# Patient Record
Sex: Female | Born: 1935 | Race: White | Hispanic: No | Smoking: Never smoker
Health system: Southern US, Community
[De-identification: ages and names within clinical notes are randomized; demographics above are authoritative.]

## PROBLEM LIST (undated history)

## (undated) DIAGNOSIS — I8290 Acute embolism and thrombosis of unspecified vein: Secondary | ICD-10-CM

## (undated) DIAGNOSIS — M1990 Unspecified osteoarthritis, unspecified site: Secondary | ICD-10-CM

## (undated) DIAGNOSIS — I2510 Atherosclerotic heart disease of native coronary artery without angina pectoris: Secondary | ICD-10-CM

## (undated) DIAGNOSIS — K219 Gastro-esophageal reflux disease without esophagitis: Secondary | ICD-10-CM

## (undated) DIAGNOSIS — E079 Disorder of thyroid, unspecified: Secondary | ICD-10-CM

## (undated) DIAGNOSIS — O2230 Deep phlebothrombosis in pregnancy, unspecified trimester: Secondary | ICD-10-CM

## (undated) DIAGNOSIS — G459 Transient cerebral ischemic attack, unspecified: Secondary | ICD-10-CM

## (undated) DIAGNOSIS — I219 Acute myocardial infarction, unspecified: Secondary | ICD-10-CM

## (undated) DIAGNOSIS — J449 Chronic obstructive pulmonary disease, unspecified: Secondary | ICD-10-CM

## (undated) DIAGNOSIS — I639 Cerebral infarction, unspecified: Secondary | ICD-10-CM

## (undated) DIAGNOSIS — Z950 Presence of cardiac pacemaker: Secondary | ICD-10-CM

## (undated) DIAGNOSIS — I2699 Other pulmonary embolism without acute cor pulmonale: Secondary | ICD-10-CM

## (undated) DIAGNOSIS — K5792 Diverticulitis of intestine, part unspecified, without perforation or abscess without bleeding: Secondary | ICD-10-CM

## (undated) DIAGNOSIS — M419 Scoliosis, unspecified: Secondary | ICD-10-CM

## (undated) DIAGNOSIS — C801 Malignant (primary) neoplasm, unspecified: Secondary | ICD-10-CM

## (undated) HISTORY — PX: EYE SURGERY: SHX253

## (undated) HISTORY — PX: BREAST SURGERY: SHX581

## (undated) HISTORY — PX: TONSILLECTOMY: SUR1361

## (undated) HISTORY — PX: PACEMAKER IMPLANT: EP1218

## (undated) HISTORY — PX: APPENDECTOMY: SHX54

---

## 2004-12-31 ENCOUNTER — Ambulatory Visit: Payer: Self-pay | Admitting: Internal Medicine

## 2005-04-13 ENCOUNTER — Ambulatory Visit: Payer: Self-pay | Admitting: Pain Medicine

## 2005-10-01 ENCOUNTER — Other Ambulatory Visit: Payer: Self-pay

## 2005-10-01 ENCOUNTER — Inpatient Hospital Stay: Payer: Self-pay | Admitting: Internal Medicine

## 2005-10-02 ENCOUNTER — Other Ambulatory Visit: Payer: Self-pay

## 2005-10-03 ENCOUNTER — Other Ambulatory Visit: Payer: Self-pay

## 2005-10-10 ENCOUNTER — Ambulatory Visit: Payer: Self-pay | Admitting: Internal Medicine

## 2005-10-13 ENCOUNTER — Ambulatory Visit: Payer: Self-pay | Admitting: Internal Medicine

## 2005-10-25 ENCOUNTER — Ambulatory Visit: Payer: Self-pay | Admitting: Internal Medicine

## 2005-11-02 ENCOUNTER — Ambulatory Visit: Payer: Self-pay | Admitting: Internal Medicine

## 2006-03-07 ENCOUNTER — Ambulatory Visit: Payer: Self-pay | Admitting: Internal Medicine

## 2006-03-23 ENCOUNTER — Ambulatory Visit: Payer: Self-pay

## 2006-03-30 ENCOUNTER — Other Ambulatory Visit: Payer: Self-pay

## 2006-03-30 ENCOUNTER — Inpatient Hospital Stay: Payer: Self-pay | Admitting: Internal Medicine

## 2006-04-21 ENCOUNTER — Ambulatory Visit: Payer: Self-pay | Admitting: Internal Medicine

## 2006-06-29 ENCOUNTER — Other Ambulatory Visit: Payer: Self-pay

## 2006-06-29 ENCOUNTER — Inpatient Hospital Stay: Payer: Self-pay | Admitting: Internal Medicine

## 2006-07-14 ENCOUNTER — Ambulatory Visit: Payer: Self-pay | Admitting: Internal Medicine

## 2006-11-15 ENCOUNTER — Other Ambulatory Visit: Payer: Self-pay

## 2006-11-15 ENCOUNTER — Inpatient Hospital Stay: Payer: Self-pay | Admitting: Internal Medicine

## 2006-12-19 ENCOUNTER — Ambulatory Visit: Payer: Self-pay | Admitting: Internal Medicine

## 2007-03-08 ENCOUNTER — Ambulatory Visit: Payer: Self-pay | Admitting: Internal Medicine

## 2007-04-06 ENCOUNTER — Ambulatory Visit: Payer: Self-pay | Admitting: Internal Medicine

## 2007-09-05 ENCOUNTER — Ambulatory Visit: Payer: Self-pay | Admitting: Rheumatology

## 2008-04-03 ENCOUNTER — Ambulatory Visit: Payer: Self-pay | Admitting: Ophthalmology

## 2008-04-16 ENCOUNTER — Ambulatory Visit: Payer: Self-pay | Admitting: Ophthalmology

## 2008-04-18 ENCOUNTER — Ambulatory Visit: Payer: Self-pay | Admitting: Internal Medicine

## 2008-04-25 ENCOUNTER — Ambulatory Visit: Payer: Self-pay | Admitting: Unknown Physician Specialty

## 2009-04-22 ENCOUNTER — Ambulatory Visit: Payer: Self-pay | Admitting: Unknown Physician Specialty

## 2009-04-28 ENCOUNTER — Ambulatory Visit: Payer: Self-pay | Admitting: Unknown Physician Specialty

## 2009-05-07 ENCOUNTER — Ambulatory Visit: Payer: Self-pay | Admitting: Unknown Physician Specialty

## 2009-08-18 ENCOUNTER — Ambulatory Visit: Payer: Self-pay | Admitting: Internal Medicine

## 2010-09-14 ENCOUNTER — Ambulatory Visit: Payer: Self-pay | Admitting: Internal Medicine

## 2010-11-24 ENCOUNTER — Ambulatory Visit: Payer: Self-pay | Admitting: Internal Medicine

## 2011-01-19 ENCOUNTER — Ambulatory Visit: Payer: Self-pay | Admitting: Cardiology

## 2011-01-27 ENCOUNTER — Ambulatory Visit: Payer: Self-pay | Admitting: Internal Medicine

## 2011-02-27 ENCOUNTER — Emergency Department: Payer: Self-pay | Admitting: Emergency Medicine

## 2011-06-17 ENCOUNTER — Ambulatory Visit: Payer: Self-pay | Admitting: Internal Medicine

## 2011-09-23 ENCOUNTER — Encounter: Payer: Self-pay | Admitting: Internal Medicine

## 2011-10-04 ENCOUNTER — Ambulatory Visit: Payer: Self-pay | Admitting: Internal Medicine

## 2011-10-08 ENCOUNTER — Encounter: Payer: Self-pay | Admitting: Internal Medicine

## 2011-11-05 ENCOUNTER — Encounter: Payer: Self-pay | Admitting: Internal Medicine

## 2011-12-01 ENCOUNTER — Ambulatory Visit: Payer: Self-pay | Admitting: Internal Medicine

## 2012-02-18 ENCOUNTER — Inpatient Hospital Stay: Payer: Self-pay | Admitting: Internal Medicine

## 2012-02-18 LAB — URINALYSIS, COMPLETE
Leukocyte Esterase: NEGATIVE
Protein: NEGATIVE
Squamous Epithelial: 1
WBC UR: 2 /HPF (ref 0–5)

## 2012-02-18 LAB — COMPREHENSIVE METABOLIC PANEL
Alkaline Phosphatase: 107 U/L (ref 50–136)
BUN: 18 mg/dL (ref 7–18)
Bilirubin,Total: 1 mg/dL (ref 0.2–1.0)
Chloride: 104 mmol/L (ref 98–107)
Co2: 26 mmol/L (ref 21–32)
Creatinine: 0.69 mg/dL (ref 0.60–1.30)
EGFR (African American): >60
EGFR (Non-African Amer.): >60
Osmolality: 281 (ref 275–301)
Total Protein: 6.8 g/dL (ref 6.4–8.2)

## 2012-02-18 LAB — CBC
MCH: 28.1 pg (ref 26.0–34.0)
MCV: 88 fL (ref 80–100)
Platelet: 213 10*3/uL (ref 150–440)
RBC: 4.07 10*6/uL (ref 3.80–5.20)
RDW: 13.6 % (ref 11.5–14.5)

## 2012-02-18 LAB — APTT: Activated PTT: 27.5 secs (ref 23.6–35.9)

## 2012-02-18 LAB — LIPASE, BLOOD: Lipase: 32 U/L — ABNORMAL LOW (ref 73–393)

## 2012-02-18 LAB — CK TOTAL AND CKMB (NOT AT ARMC): CK-MB: 0.6 ng/mL (ref 0.5–3.6)

## 2012-02-18 LAB — PROTIME-INR: Prothrombin Time: 12.6 secs (ref 11.5–14.7)

## 2012-02-18 LAB — TROPONIN I: Troponin-I: 0.03 ng/mL

## 2012-02-19 LAB — CBC WITH DIFFERENTIAL/PLATELET
Basophil #: 0 10*3/uL (ref 0.0–0.1)
Basophil %: 0.1 %
Eosinophil #: 0 10*3/uL (ref 0.0–0.7)
Eosinophil %: 0 %
MCH: 27.7 pg (ref 26.0–34.0)
MCHC: 31.7 g/dL — ABNORMAL LOW (ref 32.0–36.0)
MCV: 87 fL (ref 80–100)
Monocyte #: 1.5 x10 3/mm — ABNORMAL HIGH (ref 0.2–0.9)
Monocyte %: 5.8 %
Neutrophil %: 90.6 %
RBC: 3.78 10*6/uL — ABNORMAL LOW (ref 3.80–5.20)
RDW: 13.6 % (ref 11.5–14.5)
WBC: 26.3 10*3/uL — ABNORMAL HIGH (ref 3.6–11.0)

## 2012-02-20 LAB — CBC WITH DIFFERENTIAL/PLATELET
Basophil #: 0.1 10*3/uL (ref 0.0–0.1)
Basophil %: 0.6 %
Eosinophil %: 0 %
HCT: 32 % — ABNORMAL LOW (ref 35.0–47.0)
Lymphocyte #: 0.6 10*3/uL — ABNORMAL LOW (ref 1.0–3.6)
Lymphocyte %: 2.8 %
MCH: 27.7 pg (ref 26.0–34.0)
MCHC: 31.8 g/dL — ABNORMAL LOW (ref 32.0–36.0)
MCV: 87 fL (ref 80–100)
Neutrophil #: 21.9 10*3/uL — ABNORMAL HIGH (ref 1.4–6.5)
Neutrophil %: 94 %
Platelet: 207 10*3/uL (ref 150–440)
RBC: 3.68 10*6/uL — ABNORMAL LOW (ref 3.80–5.20)
WBC: 23.2 10*3/uL — ABNORMAL HIGH (ref 3.6–11.0)

## 2012-02-20 LAB — URINE CULTURE

## 2012-02-20 LAB — BASIC METABOLIC PANEL
Anion Gap: 8 (ref 7–16)
BUN: 14 mg/dL (ref 7–18)
Calcium, Total: 9.1 mg/dL (ref 8.5–10.1)
Chloride: 107 mmol/L (ref 98–107)
Co2: 23 mmol/L (ref 21–32)
Creatinine: 0.56 mg/dL — ABNORMAL LOW (ref 0.60–1.30)
EGFR (African American): >60

## 2012-02-21 LAB — BASIC METABOLIC PANEL
Calcium, Total: 9.3 mg/dL (ref 8.5–10.1)
Co2: 22 mmol/L (ref 21–32)
Creatinine: 0.64 mg/dL (ref 0.60–1.30)
EGFR (African American): >60
Osmolality: 278 (ref 275–301)
Sodium: 137 mmol/L (ref 136–145)

## 2012-02-21 LAB — CBC WITH DIFFERENTIAL/PLATELET
Eosinophil %: 0 %
HCT: 31.6 % — ABNORMAL LOW (ref 35.0–47.0)
HGB: 10.3 g/dL — ABNORMAL LOW (ref 12.0–16.0)
Lymphocyte #: 0.7 10*3/uL — ABNORMAL LOW (ref 1.0–3.6)
Lymphocyte %: 3.4 %
MCHC: 32.4 g/dL (ref 32.0–36.0)
MCV: 86 fL (ref 80–100)
Monocyte #: 0.4 x10 3/mm (ref 0.2–0.9)
Neutrophil #: 19.3 10*3/uL — ABNORMAL HIGH (ref 1.4–6.5)
Platelet: 227 10*3/uL (ref 150–440)
WBC: 20.4 10*3/uL — ABNORMAL HIGH (ref 3.6–11.0)

## 2012-02-22 LAB — CBC WITH DIFFERENTIAL/PLATELET
Basophil #: 0 10*3/uL (ref 0.0–0.1)
Basophil %: 0.2 %
Eosinophil #: 0 10*3/uL (ref 0.0–0.7)
Eosinophil %: 0.1 %
HCT: 31.2 % — ABNORMAL LOW (ref 35.0–47.0)
HGB: 9.8 g/dL — ABNORMAL LOW (ref 12.0–16.0)
Lymphocyte #: 0.5 10*3/uL — ABNORMAL LOW (ref 1.0–3.6)
MCH: 27 pg (ref 26.0–34.0)
MCHC: 31.3 g/dL — ABNORMAL LOW (ref 32.0–36.0)
Monocyte #: 0.4 x10 3/mm (ref 0.2–0.9)
Neutrophil #: 11.5 10*3/uL — ABNORMAL HIGH (ref 1.4–6.5)
Platelet: 272 10*3/uL (ref 150–440)
RDW: 14.3 % (ref 11.5–14.5)
WBC: 12.5 10*3/uL — ABNORMAL HIGH (ref 3.6–11.0)

## 2012-02-22 LAB — EXPECTORATED SPUTUM ASSESSMENT W REFEX TO RESP CULTURE

## 2012-02-22 LAB — BASIC METABOLIC PANEL
Anion Gap: 9 (ref 7–16)
BUN: 19 mg/dL — ABNORMAL HIGH (ref 7–18)
Calcium, Total: 8.9 mg/dL (ref 8.5–10.1)
Creatinine: 0.65 mg/dL (ref 0.60–1.30)
Glucose: 121 mg/dL — ABNORMAL HIGH (ref 65–99)
Osmolality: 283 (ref 275–301)
Potassium: 3.9 mmol/L (ref 3.5–5.1)
Sodium: 140 mmol/L (ref 136–145)

## 2012-02-23 LAB — BASIC METABOLIC PANEL
Anion Gap: 9 (ref 7–16)
BUN: 20 mg/dL — ABNORMAL HIGH (ref 7–18)
Calcium, Total: 8.8 mg/dL (ref 8.5–10.1)
Chloride: 108 mmol/L — ABNORMAL HIGH (ref 98–107)
EGFR (African American): >60
EGFR (Non-African Amer.): >60
Glucose: 90 mg/dL (ref 65–99)
Osmolality: 283 (ref 275–301)
Potassium: 3.6 mmol/L (ref 3.5–5.1)

## 2012-02-23 LAB — CBC WITH DIFFERENTIAL/PLATELET
Basophil #: 0 10*3/uL (ref 0.0–0.1)
Eosinophil #: 0 10*3/uL (ref 0.0–0.7)
HGB: 9.3 g/dL — ABNORMAL LOW (ref 12.0–16.0)
MCV: 86 fL (ref 80–100)
Monocyte #: 0.8 x10 3/mm (ref 0.2–0.9)
Neutrophil #: 9.1 10*3/uL — ABNORMAL HIGH (ref 1.4–6.5)
Neutrophil %: 81.9 %
Platelet: 257 10*3/uL (ref 150–440)
RBC: 3.32 10*6/uL — ABNORMAL LOW (ref 3.80–5.20)
RDW: 14.1 % (ref 11.5–14.5)

## 2012-02-23 LAB — CULTURE, BLOOD (SINGLE)

## 2012-03-14 ENCOUNTER — Emergency Department: Payer: Self-pay | Admitting: Emergency Medicine

## 2012-03-14 LAB — BASIC METABOLIC PANEL
Anion Gap: 10 (ref 7–16)
Chloride: 100 mmol/L (ref 98–107)
Co2: 27 mmol/L (ref 21–32)
Creatinine: 0.93 mg/dL (ref 0.60–1.30)
Osmolality: 277 (ref 275–301)

## 2012-03-14 LAB — CBC
HCT: 30.3 % — ABNORMAL LOW (ref 35.0–47.0)
HGB: 9.6 g/dL — ABNORMAL LOW (ref 12.0–16.0)
MCHC: 31.6 g/dL — ABNORMAL LOW (ref 32.0–36.0)
MCV: 86 fL (ref 80–100)
WBC: 12.2 10*3/uL — ABNORMAL HIGH (ref 3.6–11.0)

## 2012-03-15 LAB — URINALYSIS, COMPLETE
Ketone: NEGATIVE
Leukocyte Esterase: NEGATIVE
Nitrite: NEGATIVE
Ph: 5 (ref 4.5–8.0)
Protein: 30
RBC,UR: 2 /HPF (ref 0–5)
Specific Gravity: 1.02 (ref 1.003–1.030)
WBC UR: 4 /HPF (ref 0–5)

## 2012-03-29 ENCOUNTER — Emergency Department: Payer: Self-pay | Admitting: Emergency Medicine

## 2012-04-03 ENCOUNTER — Inpatient Hospital Stay: Payer: Self-pay | Admitting: Internal Medicine

## 2012-04-03 LAB — CBC WITH DIFFERENTIAL/PLATELET
Basophil #: 0.1 10*3/uL (ref 0.0–0.1)
Basophil %: 1 %
Eosinophil #: 0.1 10*3/uL (ref 0.0–0.7)
Eosinophil %: 0.5 %
HGB: 8.8 g/dL — ABNORMAL LOW (ref 12.0–16.0)
Lymphocyte %: 5.7 %
MCHC: 32.4 g/dL (ref 32.0–36.0)
MCV: 84 fL (ref 80–100)
Monocyte #: 1.4 x10 3/mm — ABNORMAL HIGH (ref 0.2–0.9)
Monocyte %: 10.4 %
Neutrophil #: 11 10*3/uL — ABNORMAL HIGH (ref 1.4–6.5)
Neutrophil %: 82.4 %
RBC: 3.21 10*6/uL — ABNORMAL LOW (ref 3.80–5.20)
WBC: 13.3 10*3/uL — ABNORMAL HIGH (ref 3.6–11.0)

## 2012-04-03 LAB — COMPREHENSIVE METABOLIC PANEL
Alkaline Phosphatase: 197 U/L — ABNORMAL HIGH (ref 50–136)
Anion Gap: 10 (ref 7–16)
BUN: 21 mg/dL — ABNORMAL HIGH (ref 7–18)
Bilirubin,Total: 0.4 mg/dL (ref 0.2–1.0)
Chloride: 98 mmol/L (ref 98–107)
Creatinine: 1.12 mg/dL (ref 0.60–1.30)
EGFR (African American): 52 — ABNORMAL LOW
EGFR (Non-African Amer.): 44 — ABNORMAL LOW
Glucose: 100 mg/dL — ABNORMAL HIGH (ref 65–99)
Osmolality: 269 (ref 275–301)
SGOT(AST): 15 U/L (ref 15–37)
SGPT (ALT): 8 U/L — ABNORMAL LOW
Total Protein: 6.4 g/dL (ref 6.4–8.2)

## 2012-04-03 LAB — CK TOTAL AND CKMB (NOT AT ARMC): CK-MB: 0.6 ng/mL (ref 0.5–3.6)

## 2012-04-03 LAB — TROPONIN I
Troponin-I: <0.02 ng/mL
Troponin-I: <0.02 ng/mL

## 2012-04-04 LAB — CBC WITH DIFFERENTIAL/PLATELET
Basophil #: 0.1 10*3/uL (ref 0.0–0.1)
Lymphocyte #: 1 10*3/uL (ref 1.0–3.6)
Lymphocyte %: 8.3 %
MCHC: 33.3 g/dL (ref 32.0–36.0)
MCV: 82 fL (ref 80–100)
Monocyte %: 11.7 %
Neutrophil %: 79 %
Platelet: 473 10*3/uL — ABNORMAL HIGH (ref 150–440)
RBC: 3.11 10*6/uL — ABNORMAL LOW (ref 3.80–5.20)
RDW: 15.8 % — ABNORMAL HIGH (ref 11.5–14.5)

## 2012-04-04 LAB — TROPONIN I: Troponin-I: <0.02 ng/mL

## 2012-04-04 LAB — BASIC METABOLIC PANEL
BUN: 18 mg/dL (ref 7–18)
Calcium, Total: 8.7 mg/dL (ref 8.5–10.1)
Creatinine: 0.94 mg/dL (ref 0.60–1.30)
EGFR (African American): >60
EGFR (Non-African Amer.): 55 — ABNORMAL LOW
Glucose: 106 mg/dL — ABNORMAL HIGH (ref 65–99)
Osmolality: 274 (ref 275–301)
Sodium: 136 mmol/L (ref 136–145)

## 2012-04-05 LAB — BASIC METABOLIC PANEL
Anion Gap: 9 (ref 7–16)
Calcium, Total: 8.3 mg/dL — ABNORMAL LOW (ref 8.5–10.1)
Chloride: 104 mmol/L (ref 98–107)
EGFR (Non-African Amer.): >60
Glucose: 96 mg/dL (ref 65–99)
Osmolality: 279 (ref 275–301)
Potassium: 3.6 mmol/L (ref 3.5–5.1)

## 2012-04-05 LAB — CBC WITH DIFFERENTIAL/PLATELET
Basophil #: 0.1 10*3/uL (ref 0.0–0.1)
Eosinophil #: 0.1 10*3/uL (ref 0.0–0.7)
Eosinophil %: 1.3 %
HCT: 22.2 % — ABNORMAL LOW (ref 35.0–47.0)
Lymphocyte #: 1.4 10*3/uL (ref 1.0–3.6)
MCH: 28.2 pg (ref 26.0–34.0)
MCHC: 34 g/dL (ref 32.0–36.0)
Monocyte #: 1 x10 3/mm — ABNORMAL HIGH (ref 0.2–0.9)
Neutrophil #: 7.3 10*3/uL — ABNORMAL HIGH (ref 1.4–6.5)
Neutrophil %: 74.2 %
RBC: 2.68 10*6/uL — ABNORMAL LOW (ref 3.80–5.20)
RDW: 15.6 % — ABNORMAL HIGH (ref 11.5–14.5)
WBC: 9.8 10*3/uL (ref 3.6–11.0)

## 2012-04-06 LAB — BASIC METABOLIC PANEL
Anion Gap: 11 (ref 7–16)
BUN: 16 mg/dL (ref 7–18)
Calcium, Total: 8.4 mg/dL — ABNORMAL LOW (ref 8.5–10.1)
Chloride: 104 mmol/L (ref 98–107)
Co2: 26 mmol/L (ref 21–32)
Creatinine: 0.68 mg/dL (ref 0.60–1.30)
EGFR (African American): >60
EGFR (Non-African Amer.): >60
Glucose: 91 mg/dL (ref 65–99)
Osmolality: 282 (ref 275–301)

## 2012-04-06 LAB — CBC WITH DIFFERENTIAL/PLATELET
Basophil #: 0 10*3/uL (ref 0.0–0.1)
Eosinophil #: 0.2 10*3/uL (ref 0.0–0.7)
Eosinophil %: 2 %
Lymphocyte #: 1.5 10*3/uL (ref 1.0–3.6)
Lymphocyte %: 15.1 %
Monocyte %: 9.2 %
Neutrophil %: 73.4 %
Platelet: 391 10*3/uL (ref 150–440)
RBC: 3.49 10*6/uL — ABNORMAL LOW (ref 3.80–5.20)
RDW: 15.5 % — ABNORMAL HIGH (ref 11.5–14.5)
WBC: 9.9 10*3/uL (ref 3.6–11.0)

## 2012-04-06 LAB — PROTIME-INR
INR: 0.9
Prothrombin Time: 12.5 secs (ref 11.5–14.7)

## 2012-04-06 LAB — EXPECTORATED SPUTUM ASSESSMENT W GRAM STAIN, RFLX TO RESP C

## 2012-04-07 LAB — BASIC METABOLIC PANEL
Anion Gap: 10 (ref 7–16)
BUN: 12 mg/dL (ref 7–18)
Chloride: 105 mmol/L (ref 98–107)
Co2: 25 mmol/L (ref 21–32)
Creatinine: 0.63 mg/dL (ref 0.60–1.30)
Glucose: 87 mg/dL (ref 65–99)
Potassium: 4.5 mmol/L (ref 3.5–5.1)
Sodium: 140 mmol/L (ref 136–145)

## 2012-04-07 LAB — CBC WITH DIFFERENTIAL/PLATELET
Basophil #: 0.1 10*3/uL (ref 0.0–0.1)
Eosinophil %: 2.2 %
MCH: 27.9 pg (ref 26.0–34.0)
MCV: 84 fL (ref 80–100)
Monocyte #: 0.8 x10 3/mm (ref 0.2–0.9)
Monocyte %: 9.8 %
Neutrophil %: 67 %
Platelet: 417 10*3/uL (ref 150–440)
RBC: 3.81 10*6/uL (ref 3.80–5.20)
RDW: 15.4 % — ABNORMAL HIGH (ref 11.5–14.5)
WBC: 7.9 10*3/uL (ref 3.6–11.0)

## 2012-04-08 LAB — BASIC METABOLIC PANEL
BUN: 11 mg/dL (ref 7–18)
Calcium, Total: 8.8 mg/dL (ref 8.5–10.1)
Co2: 22 mmol/L (ref 21–32)
EGFR (Non-African Amer.): >60
Glucose: 94 mg/dL (ref 65–99)
Potassium: 4.9 mmol/L (ref 3.5–5.1)
Sodium: 142 mmol/L (ref 136–145)

## 2012-04-08 LAB — CBC WITH DIFFERENTIAL/PLATELET
Basophil #: 0.1 10*3/uL (ref 0.0–0.1)
Eosinophil #: 0.3 10*3/uL (ref 0.0–0.7)
Eosinophil %: 4.1 %
HCT: 31 % — ABNORMAL LOW (ref 35.0–47.0)
HGB: 10.4 g/dL — ABNORMAL LOW (ref 12.0–16.0)
Lymphocyte #: 1.8 10*3/uL (ref 1.0–3.6)
Lymphocyte %: 23.2 %
MCH: 28.5 pg (ref 26.0–34.0)
MCHC: 33.5 g/dL (ref 32.0–36.0)
MCV: 85 fL (ref 80–100)
Neutrophil #: 4.7 10*3/uL (ref 1.4–6.5)
RDW: 16 % — ABNORMAL HIGH (ref 11.5–14.5)

## 2012-04-09 ENCOUNTER — Encounter: Payer: Self-pay | Admitting: Internal Medicine

## 2012-04-09 LAB — CULTURE, BLOOD (SINGLE)

## 2012-04-11 LAB — DIGOXIN LEVEL: Digoxin: 0.71 ng/mL

## 2012-04-25 LAB — URINALYSIS, COMPLETE
Bilirubin,UR: NEGATIVE
Ketone: NEGATIVE
Ph: 5 (ref 4.5–8.0)
Protein: NEGATIVE
Specific Gravity: 1.012 (ref 1.003–1.030)
Squamous Epithelial: <1
WBC UR: 1 /HPF (ref 0–5)

## 2012-04-27 LAB — BASIC METABOLIC PANEL
Anion Gap: 9 (ref 7–16)
BUN: 14 mg/dL (ref 7–18)
Calcium, Total: 8.9 mg/dL (ref 8.5–10.1)
Chloride: 101 mmol/L (ref 98–107)
Co2: 29 mmol/L (ref 21–32)
EGFR (African American): >60
Glucose: 80 mg/dL (ref 65–99)
Sodium: 139 mmol/L (ref 136–145)

## 2012-04-27 LAB — URINE CULTURE

## 2012-05-07 ENCOUNTER — Encounter: Payer: Self-pay | Admitting: Internal Medicine

## 2012-05-24 ENCOUNTER — Emergency Department: Payer: Self-pay | Admitting: Emergency Medicine

## 2012-05-24 LAB — BASIC METABOLIC PANEL
Anion Gap: 6 — ABNORMAL LOW (ref 7–16)
BUN: 10 mg/dL (ref 7–18)
Chloride: 100 mmol/L (ref 98–107)
EGFR (African American): >60
Glucose: 92 mg/dL (ref 65–99)
Osmolality: 267 (ref 275–301)
Sodium: 134 mmol/L — ABNORMAL LOW (ref 136–145)

## 2012-05-24 LAB — TROPONIN I: Troponin-I: 0.06 ng/mL — ABNORMAL HIGH

## 2012-05-24 LAB — CBC
MCV: 85 fL (ref 80–100)
Platelet: 313 10*3/uL (ref 150–440)
RDW: 15.5 % — ABNORMAL HIGH (ref 11.5–14.5)
WBC: 9.8 10*3/uL (ref 3.6–11.0)

## 2012-05-26 ENCOUNTER — Ambulatory Visit: Payer: Self-pay | Admitting: Specialist

## 2012-06-09 ENCOUNTER — Ambulatory Visit: Payer: Self-pay | Admitting: Internal Medicine

## 2012-06-09 ENCOUNTER — Inpatient Hospital Stay: Payer: Self-pay | Admitting: Internal Medicine

## 2012-06-09 LAB — URINALYSIS, COMPLETE
Blood: NEGATIVE
Hyaline Cast: 7
Nitrite: NEGATIVE
Ph: 5 (ref 4.5–8.0)
Specific Gravity: 1.017 (ref 1.003–1.030)
Squamous Epithelial: 2

## 2012-06-09 LAB — COMPREHENSIVE METABOLIC PANEL
Alkaline Phosphatase: 131 U/L (ref 50–136)
Anion Gap: 11 (ref 7–16)
BUN: 18 mg/dL (ref 7–18)
Chloride: 102 mmol/L (ref 98–107)
Creatinine: 0.89 mg/dL (ref 0.60–1.30)
Glucose: 109 mg/dL — ABNORMAL HIGH (ref 65–99)
Potassium: 4.8 mmol/L (ref 3.5–5.1)
SGOT(AST): 20 U/L (ref 15–37)
SGPT (ALT): 13 U/L (ref 12–78)
Total Protein: 6.4 g/dL (ref 6.4–8.2)

## 2012-06-09 LAB — CK TOTAL AND CKMB (NOT AT ARMC)
CK, Total: 16 U/L — ABNORMAL LOW (ref 21–215)
CK-MB: 0.7 ng/mL (ref 0.5–3.6)

## 2012-06-09 LAB — CBC WITH DIFFERENTIAL/PLATELET
Basophil %: 0.3 %
Eosinophil %: 0.1 %
HCT: 31.4 % — ABNORMAL LOW (ref 35.0–47.0)
HGB: 10.4 g/dL — ABNORMAL LOW (ref 12.0–16.0)
Lymphocyte #: 1.2 10*3/uL (ref 1.0–3.6)
Lymphocyte %: 6.2 %
MCV: 83 fL (ref 80–100)
Monocyte %: 6.2 %
Neutrophil #: 17 10*3/uL — ABNORMAL HIGH (ref 1.4–6.5)
WBC: 19.5 10*3/uL — ABNORMAL HIGH (ref 3.6–11.0)

## 2012-06-10 LAB — BASIC METABOLIC PANEL
BUN: 15 mg/dL (ref 7–18)
Calcium, Total: 8.9 mg/dL (ref 8.5–10.1)
Co2: 26 mmol/L (ref 21–32)
EGFR (African American): >60
EGFR (Non-African Amer.): >60
Glucose: 97 mg/dL (ref 65–99)
Osmolality: 276 (ref 275–301)

## 2012-06-10 LAB — CBC WITH DIFFERENTIAL/PLATELET
Basophil #: 0.1 10*3/uL (ref 0.0–0.1)
Eosinophil %: 0.8 %
HCT: 30.2 % — ABNORMAL LOW (ref 35.0–47.0)
Lymphocyte #: 1.6 10*3/uL (ref 1.0–3.6)
Lymphocyte %: 11.7 %
MCH: 27.4 pg (ref 26.0–34.0)
MCV: 83 fL (ref 80–100)
Monocyte #: 1 x10 3/mm — ABNORMAL HIGH (ref 0.2–0.9)
Monocyte %: 7.1 %
Platelet: 326 10*3/uL (ref 150–440)
RBC: 3.66 10*6/uL — ABNORMAL LOW (ref 3.80–5.20)
RDW: 15.5 % — ABNORMAL HIGH (ref 11.5–14.5)

## 2012-06-12 LAB — SEDIMENTATION RATE: Erythrocyte Sed Rate: 70 mm/hr — ABNORMAL HIGH (ref 0–30)

## 2012-06-13 ENCOUNTER — Ambulatory Visit: Payer: Self-pay | Admitting: Internal Medicine

## 2012-06-13 LAB — BASIC METABOLIC PANEL
Anion Gap: 8 (ref 7–16)
Calcium, Total: 9.2 mg/dL (ref 8.5–10.1)
Co2: 29 mmol/L (ref 21–32)
EGFR (African American): >60
EGFR (Non-African Amer.): >60
Glucose: 107 mg/dL — ABNORMAL HIGH (ref 65–99)
Osmolality: 275 (ref 275–301)
Sodium: 138 mmol/L (ref 136–145)

## 2012-06-13 LAB — EXPECTORATED SPUTUM ASSESSMENT W REFEX TO RESP CULTURE

## 2012-06-14 LAB — BASIC METABOLIC PANEL
Anion Gap: 8 (ref 7–16)
Calcium, Total: 9.1 mg/dL (ref 8.5–10.1)
Chloride: 99 mmol/L (ref 98–107)
EGFR (African American): >60
EGFR (Non-African Amer.): >60
Glucose: 99 mg/dL (ref 65–99)
Osmolality: 272 (ref 275–301)
Sodium: 136 mmol/L (ref 136–145)

## 2012-06-14 LAB — CBC WITH DIFFERENTIAL/PLATELET
Basophil #: 0 10*3/uL (ref 0.0–0.1)
Basophil %: 0.3 %
Basophil %: 0.8 %
Eosinophil #: 0.3 10*3/uL (ref 0.0–0.7)
Eosinophil #: 0.2 10*3/uL (ref 0.0–0.7)
Eosinophil %: 2.1 %
HCT: 30 % — ABNORMAL LOW (ref 35.0–47.0)
HGB: 9.8 g/dL — ABNORMAL LOW (ref 12.0–16.0)
HGB: 9.9 g/dL — ABNORMAL LOW (ref 12.0–16.0)
Lymphocyte #: 1.8 10*3/uL (ref 1.0–3.6)
Lymphocyte #: 2.1 10*3/uL (ref 1.0–3.6)
MCH: 26.8 pg (ref 26.0–34.0)
MCH: 27.5 pg (ref 26.0–34.0)
MCHC: 32.6 g/dL (ref 32.0–36.0)
MCHC: 33.2 g/dL (ref 32.0–36.0)
MCV: 82 fL (ref 80–100)
MCV: 83 fL (ref 80–100)
Monocyte #: 0.9 x10 3/mm (ref 0.2–0.9)
Monocyte %: 7.4 %
Neutrophil #: 6.2 10*3/uL (ref 1.4–6.5)
Neutrophil #: 8.3 10*3/uL — ABNORMAL HIGH (ref 1.4–6.5)
Neutrophil %: 69.2 %
RBC: 3.67 10*6/uL — ABNORMAL LOW (ref 3.80–5.20)
RBC: 3.61 10*6/uL — ABNORMAL LOW (ref 3.80–5.20)
RDW: 15.3 % — ABNORMAL HIGH (ref 11.5–14.5)
RDW: 15.4 % — ABNORMAL HIGH (ref 11.5–14.5)
WBC: 9 10*3/uL (ref 3.6–11.0)

## 2012-06-15 LAB — CBC WITH DIFFERENTIAL/PLATELET
Basophil #: 0.1 10*3/uL (ref 0.0–0.1)
Eosinophil %: 1.8 %
HGB: 9.6 g/dL — ABNORMAL LOW (ref 12.0–16.0)
Lymphocyte %: 19.2 %
MCV: 83 fL (ref 80–100)
Monocyte %: 8.7 %
Platelet: 367 10*3/uL (ref 150–440)
RBC: 3.47 10*6/uL — ABNORMAL LOW (ref 3.80–5.20)
RDW: 15.4 % — ABNORMAL HIGH (ref 11.5–14.5)
WBC: 10.6 10*3/uL (ref 3.6–11.0)

## 2012-06-16 ENCOUNTER — Encounter: Payer: Self-pay | Admitting: Internal Medicine

## 2012-06-20 LAB — COMPREHENSIVE METABOLIC PANEL
Alkaline Phosphatase: 109 U/L (ref 50–136)
Anion Gap: 11 (ref 7–16)
Bilirubin,Total: 0.8 mg/dL (ref 0.2–1.0)
Calcium, Total: 9.7 mg/dL (ref 8.5–10.1)
Chloride: 103 mmol/L (ref 98–107)
Co2: 23 mmol/L (ref 21–32)
EGFR (Non-African Amer.): >60
Osmolality: 272 (ref 275–301)
SGOT(AST): 9 U/L — ABNORMAL LOW (ref 15–37)
SGPT (ALT): 11 U/L — ABNORMAL LOW (ref 12–78)

## 2012-06-20 LAB — CBC WITH DIFFERENTIAL/PLATELET
Basophil %: 1.2 %
Eosinophil #: 0.2 10*3/uL (ref 0.0–0.7)
Eosinophil %: 1.7 %
HCT: 27.1 % — ABNORMAL LOW (ref 35.0–47.0)
HGB: 8.9 g/dL — ABNORMAL LOW (ref 12.0–16.0)
Lymphocyte #: 1.6 10*3/uL (ref 1.0–3.6)
Lymphocyte %: 16.8 %
MCH: 27.1 pg (ref 26.0–34.0)
MCHC: 32.6 g/dL (ref 32.0–36.0)
Monocyte #: 0.9 x10 3/mm (ref 0.2–0.9)
Monocyte %: 9.4 %
Neutrophil #: 6.8 10*3/uL — ABNORMAL HIGH (ref 1.4–6.5)
Neutrophil %: 70.9 %
RBC: 3.26 10*6/uL — ABNORMAL LOW (ref 3.80–5.20)

## 2012-06-30 LAB — URINALYSIS, COMPLETE
Bacteria: NONE SEEN
Blood: NEGATIVE
Glucose,UR: NEGATIVE mg/dL (ref 0–75)
Ketone: NEGATIVE
Leukocyte Esterase: NEGATIVE
Nitrite: NEGATIVE
Protein: NEGATIVE
Specific Gravity: 1.008 (ref 1.003–1.030)
WBC UR: 2 /HPF (ref 0–5)

## 2012-07-04 LAB — COMPREHENSIVE METABOLIC PANEL
Albumin: 2.4 g/dL — ABNORMAL LOW (ref 3.4–5.0)
Alkaline Phosphatase: 123 U/L (ref 50–136)
Anion Gap: 12 (ref 7–16)
Calcium, Total: 9.3 mg/dL (ref 8.5–10.1)
Chloride: 100 mmol/L (ref 98–107)
EGFR (African American): >60
EGFR (Non-African Amer.): >60
Glucose: 82 mg/dL (ref 65–99)
Potassium: 4.4 mmol/L (ref 3.5–5.1)
SGOT(AST): 12 U/L — ABNORMAL LOW (ref 15–37)
SGPT (ALT): 14 U/L (ref 12–78)
Total Protein: 6.4 g/dL (ref 6.4–8.2)

## 2012-07-04 LAB — CBC WITH DIFFERENTIAL/PLATELET
Basophil #: 0.1 10*3/uL (ref 0.0–0.1)
Eosinophil #: 0.3 10*3/uL (ref 0.0–0.7)
Eosinophil %: 3.3 %
HCT: 28.7 % — ABNORMAL LOW (ref 35.0–47.0)
Lymphocyte #: 1.7 10*3/uL (ref 1.0–3.6)
Lymphocyte %: 19.5 %
MCH: 27 pg (ref 26.0–34.0)
MCHC: 33.3 g/dL (ref 32.0–36.0)
MCV: 81 fL (ref 80–100)
Monocyte #: 0.8 x10 3/mm (ref 0.2–0.9)
Monocyte %: 9.9 %
Neutrophil #: 5.6 10*3/uL (ref 1.4–6.5)
Neutrophil %: 66 %
Platelet: 338 10*3/uL (ref 150–440)
RBC: 3.55 10*6/uL — ABNORMAL LOW (ref 3.80–5.20)
RDW: 15.8 % — ABNORMAL HIGH (ref 11.5–14.5)
WBC: 8.5 10*3/uL (ref 3.6–11.0)

## 2012-07-04 LAB — TSH: Thyroid Stimulating Horm: 1.39 u[IU]/mL

## 2012-07-07 ENCOUNTER — Ambulatory Visit: Payer: Self-pay | Admitting: Internal Medicine

## 2012-07-07 ENCOUNTER — Encounter: Payer: Self-pay | Admitting: Internal Medicine

## 2012-07-24 ENCOUNTER — Emergency Department: Payer: Self-pay | Admitting: Emergency Medicine

## 2012-09-06 ENCOUNTER — Ambulatory Visit: Payer: Self-pay | Admitting: Internal Medicine

## 2012-09-28 ENCOUNTER — Inpatient Hospital Stay: Payer: Self-pay | Admitting: Internal Medicine

## 2012-09-28 LAB — BASIC METABOLIC PANEL
Anion Gap: 8 (ref 7–16)
BUN: 21 mg/dL — ABNORMAL HIGH (ref 7–18)
Calcium, Total: 8.4 mg/dL — ABNORMAL LOW (ref 8.5–10.1)
EGFR (African American): >60
EGFR (Non-African Amer.): >60
Potassium: 4 mmol/L (ref 3.5–5.1)

## 2012-09-28 LAB — CBC
MCH: 27.3 pg (ref 26.0–34.0)
RBC: 3.68 10*6/uL — ABNORMAL LOW (ref 3.80–5.20)
WBC: 9.4 10*3/uL (ref 3.6–11.0)

## 2012-09-30 LAB — CBC WITH DIFFERENTIAL/PLATELET
Basophil #: 0.1 10*3/uL (ref 0.0–0.1)
Eosinophil %: 2 %
HGB: 9.3 g/dL — ABNORMAL LOW (ref 12.0–16.0)
Lymphocyte #: 1.1 10*3/uL (ref 1.0–3.6)
Lymphocyte %: 13.9 %
MCH: 27.5 pg (ref 26.0–34.0)
MCHC: 33 g/dL (ref 32.0–36.0)
Neutrophil #: 5.7 10*3/uL (ref 1.4–6.5)
Neutrophil %: 73.8 %
Platelet: 210 10*3/uL (ref 150–440)
RDW: 16.5 % — ABNORMAL HIGH (ref 11.5–14.5)
WBC: 7.8 10*3/uL (ref 3.6–11.0)

## 2012-09-30 LAB — BASIC METABOLIC PANEL
BUN: 15 mg/dL (ref 7–18)
Chloride: 110 mmol/L — ABNORMAL HIGH (ref 98–107)
EGFR (African American): >60
Osmolality: 282 (ref 275–301)
Sodium: 141 mmol/L (ref 136–145)

## 2012-10-01 LAB — CBC WITH DIFFERENTIAL/PLATELET
Basophil #: 0.1 10*3/uL (ref 0.0–0.1)
Basophil %: 1.3 %
HCT: 28.2 % — ABNORMAL LOW (ref 35.0–47.0)
HGB: 9.3 g/dL — ABNORMAL LOW (ref 12.0–16.0)
Lymphocyte %: 17.2 %
MCV: 84 fL (ref 80–100)
Monocyte #: 0.8 x10 3/mm (ref 0.2–0.9)
Platelet: 226 10*3/uL (ref 150–440)
RBC: 3.36 10*6/uL — ABNORMAL LOW (ref 3.80–5.20)
RDW: 16.3 % — ABNORMAL HIGH (ref 11.5–14.5)

## 2012-10-01 LAB — BASIC METABOLIC PANEL
Anion Gap: 7 (ref 7–16)
BUN: 12 mg/dL (ref 7–18)
Calcium, Total: 8.9 mg/dL (ref 8.5–10.1)
Chloride: 111 mmol/L — ABNORMAL HIGH (ref 98–107)
Co2: 23 mmol/L (ref 21–32)
Creatinine: 0.67 mg/dL (ref 0.60–1.30)
Glucose: 91 mg/dL (ref 65–99)
Sodium: 141 mmol/L (ref 136–145)

## 2012-10-01 LAB — EXPECTORATED SPUTUM ASSESSMENT W GRAM STAIN, RFLX TO RESP C

## 2012-10-02 LAB — BASIC METABOLIC PANEL
Co2: 24 mmol/L (ref 21–32)
EGFR (African American): >60
Glucose: 82 mg/dL (ref 65–99)
Sodium: 140 mmol/L (ref 136–145)

## 2012-10-02 LAB — CBC WITH DIFFERENTIAL/PLATELET
Eosinophil %: 3.5 %
Lymphocyte #: 1.6 10*3/uL (ref 1.0–3.6)
Lymphocyte %: 17.7 %
MCH: 27.4 pg (ref 26.0–34.0)
MCV: 85 fL (ref 80–100)
Platelet: 269 10*3/uL (ref 150–440)
RDW: 16.9 % — ABNORMAL HIGH (ref 11.5–14.5)
WBC: 9 10*3/uL (ref 3.6–11.0)

## 2012-10-04 LAB — CULTURE, BLOOD (SINGLE)

## 2012-10-07 ENCOUNTER — Ambulatory Visit: Payer: Self-pay | Admitting: Internal Medicine

## 2013-03-29 IMAGING — CR DG CHEST 2V
1 series · 2 of 2 positions shown · non-contrast
Comparison: none

REASON FOR EXAM: recent pneumonia, now has hemoptysis
COMMENTS:

PROCEDURE:     DXR - DXR CHEST PA (OR AP) AND LATERAL  - April 03, 2012  [DATE]
RESULT:     Comparison: None

[Series 1: x chest ap · 0.14mm/px · 2 of 2 slices shown]
[im 1/2]
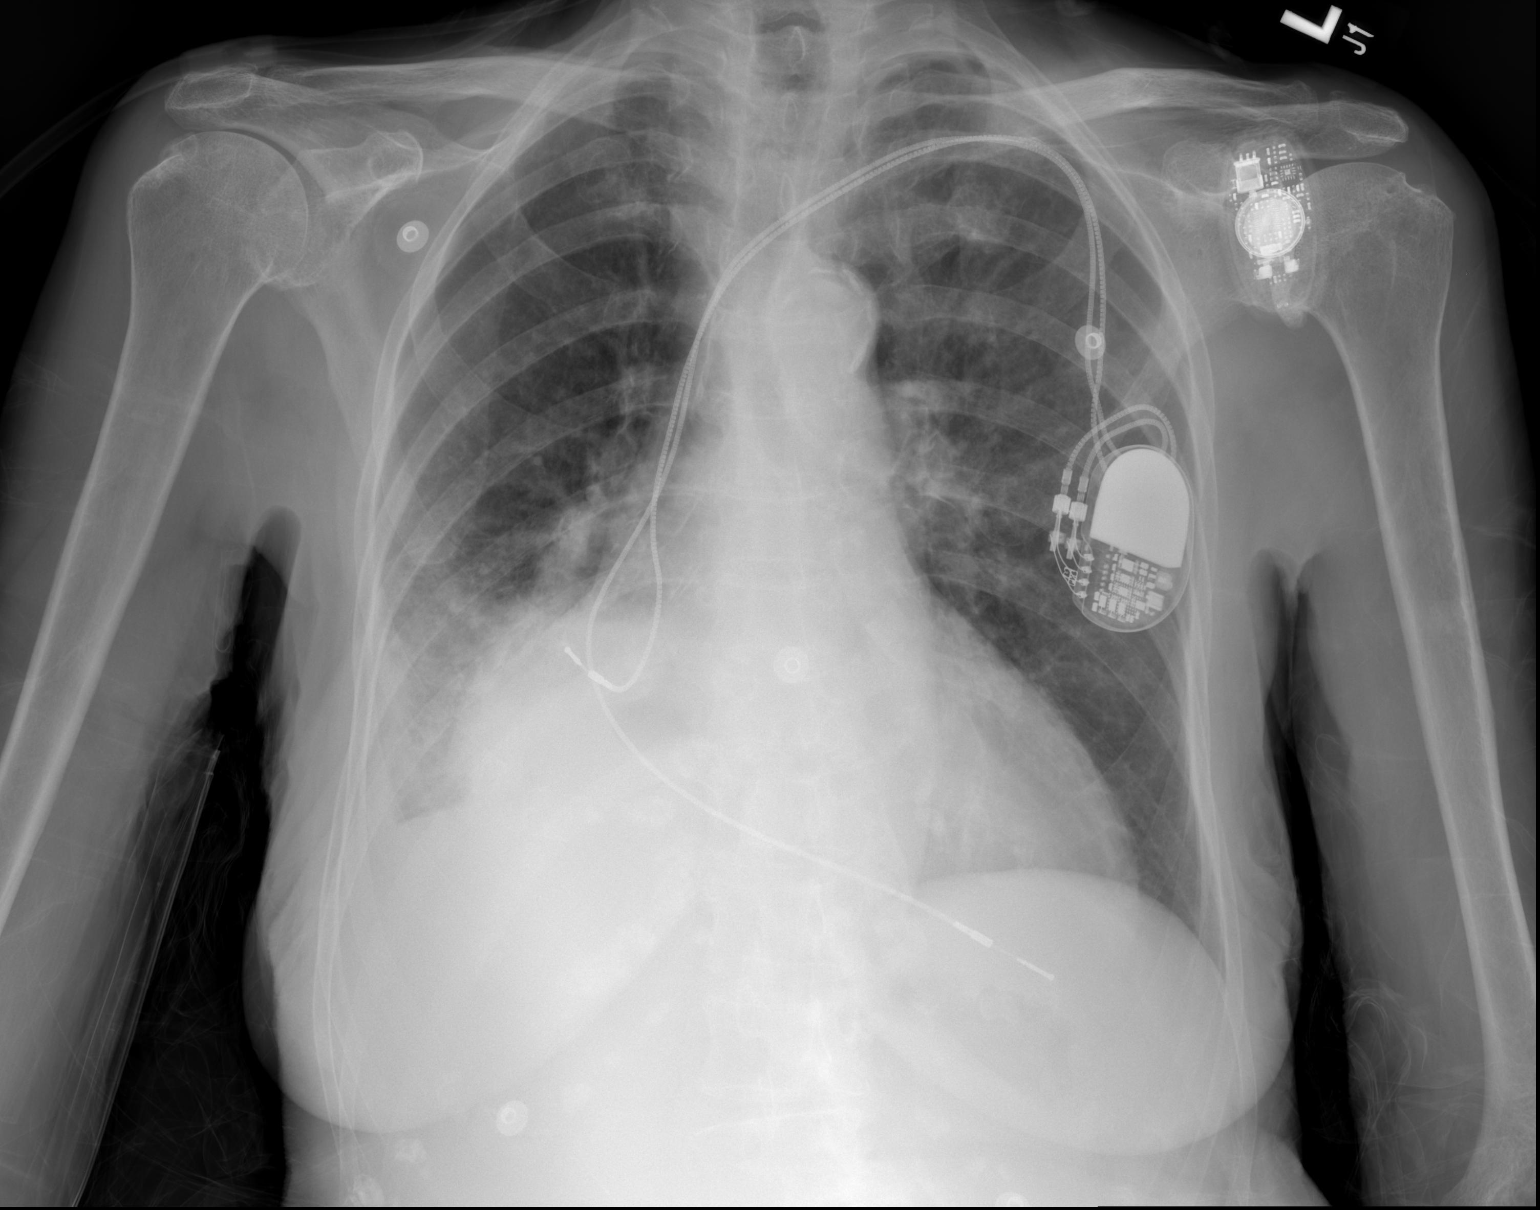
[im 2/2]
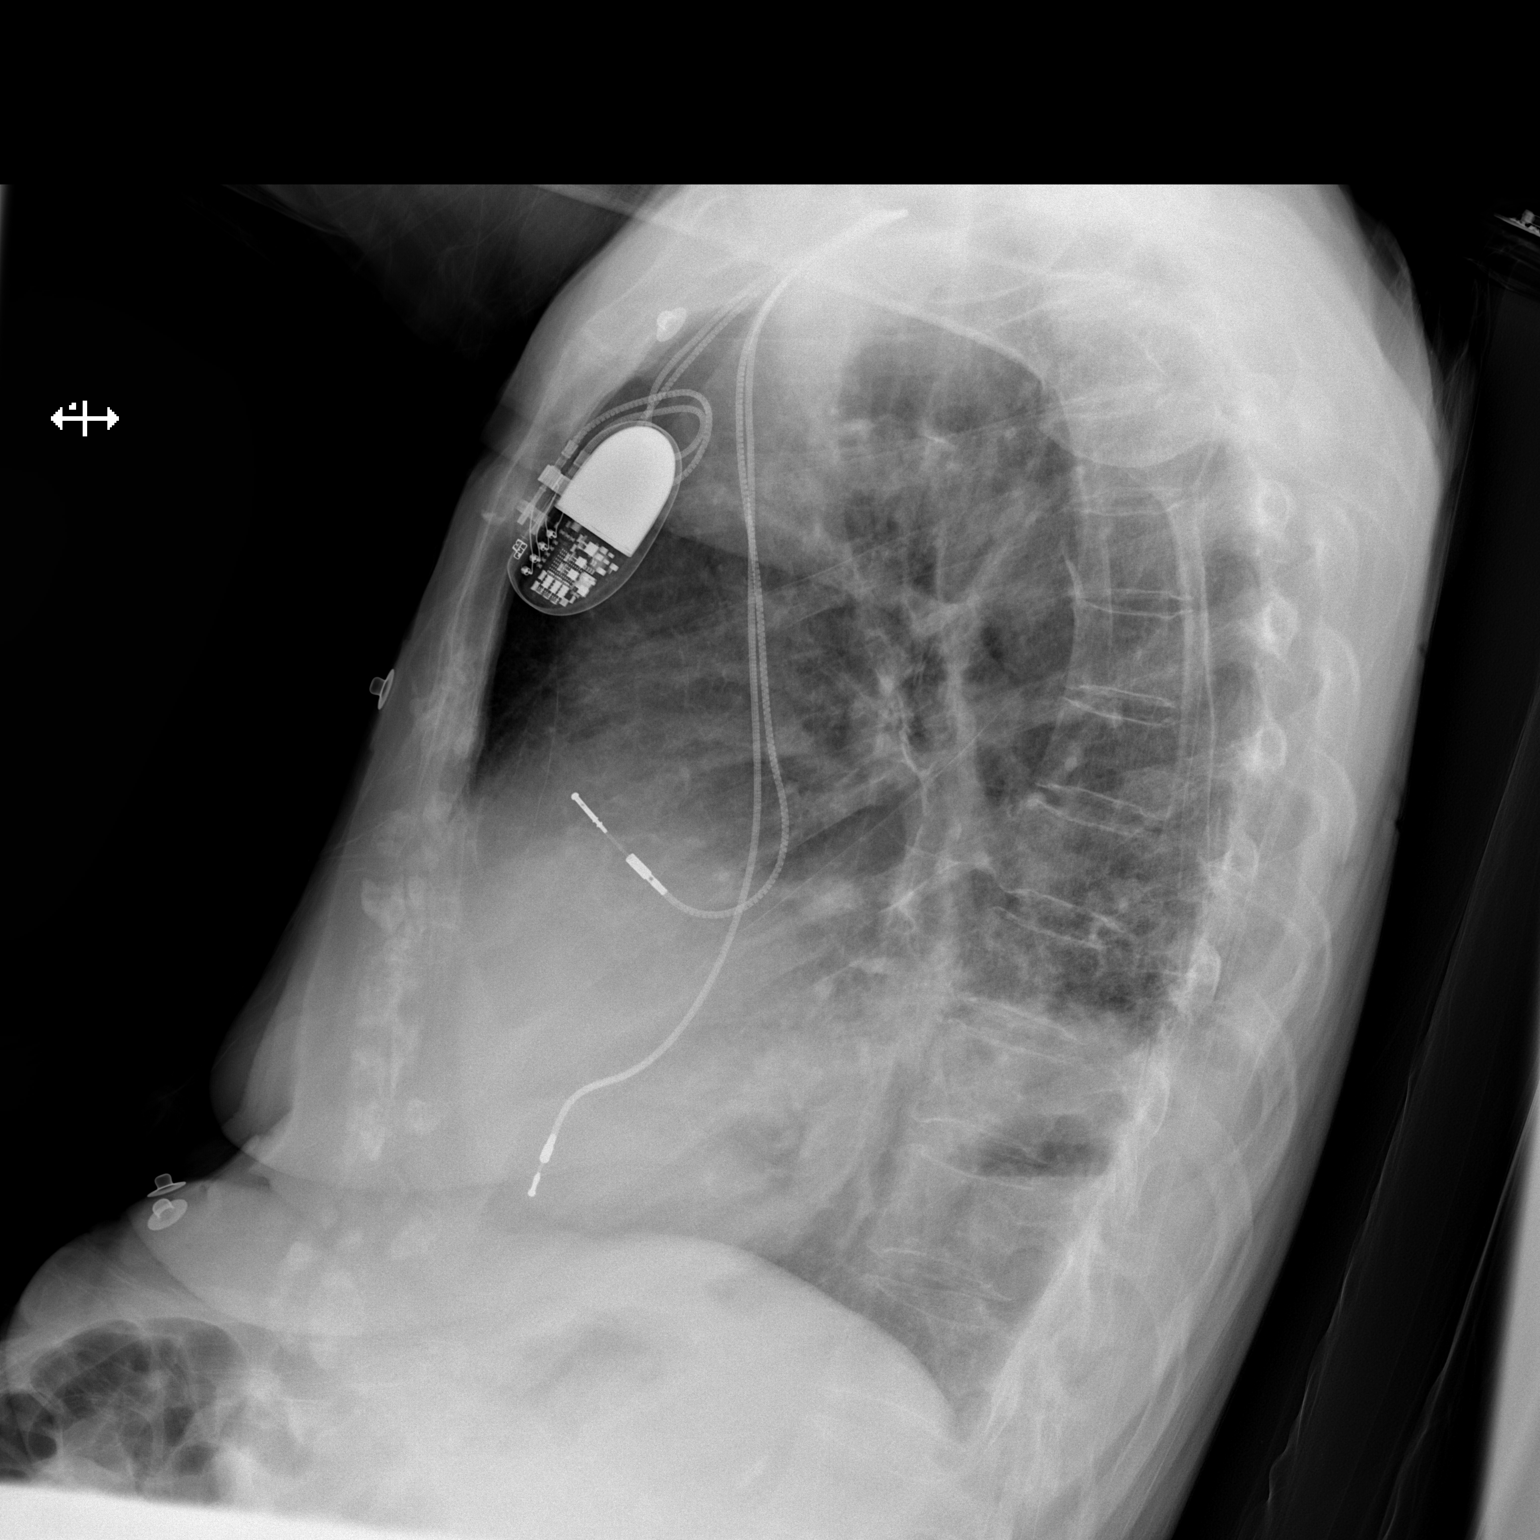

[2 of 2 positions shown; findings below may reference images not displayed]

FINDINGS: PA and lateral chest radiographs are provided. There is right lower lobe
airspace disease concerning for pneumonia. There is no other focal
parenchymal opacity, pleural effusion, or pneumothorax. The heart size is
mildly enlarged. There is a dual lead cardiac pacer noted..  The osseous
structures are unremarkable.
IMPRESSION: Right lower lobe pneumonia. Recommend followup radiography to document
complete resolution following adequate medical therapy. If there is not
complete resolution, then recommend further evaluation with CT of the chest
to exclude underlying pathology.

[REDACTED]

## 2013-06-04 IMAGING — CR DG CHEST 1V
1 series · 1 of 1 positions shown · non-contrast
Comparison: none

REASON FOR EXAM: fall
COMMENTS:

[x chest ap]
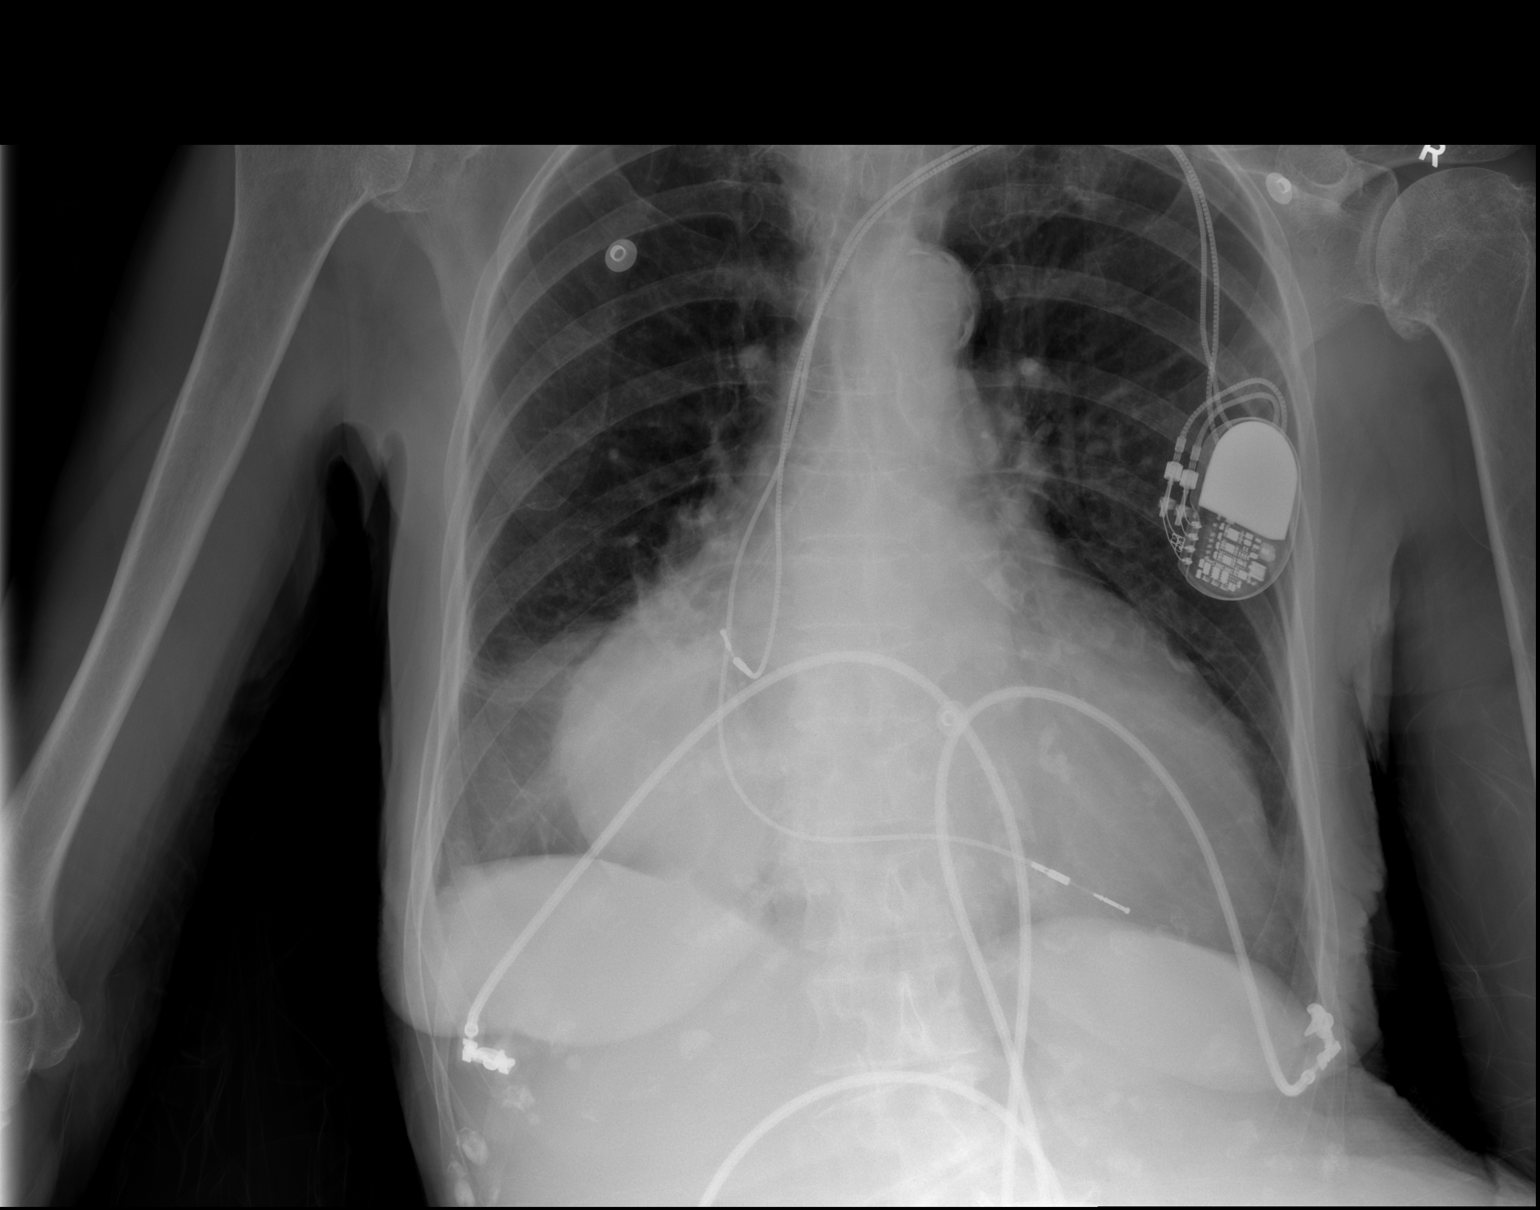

[1 of 1 positions shown; findings below may reference images not displayed]

PROCEDURE:     DXR - DXR CHEST 1 VIEWAP OR PA  - June 09, 2012  [DATE]

RESULT:     Comparison is made to the study May 24, 2012.

The lungs are well-expanded. Increased density in the retrocardiac region on
the right is present. The cardiac silhouette remains markedly enlarged. The
pulmonary vascularity is not engorged. The permanent pacemaker is unchanged
in appearance. No pleural effusion is evident. The bony thorax exhibits no
acute abnormality.
IMPRESSION: The findings are consistent with pneumonia in the right
lower lobe. There is stable marked enlargement of the cardiac silhouette
which could reflect chamber enlargement or a pericardial effusion. No
pulmonary vascular congestion is demonstrated.

[REDACTED]

## 2013-06-07 IMAGING — CR DG CHEST 1V PORT
1 series · 1 of 1 positions shown · non-contrast
Comparison: none

REASON FOR EXAM: RLLl pneumonia or collapse lung
COMMENTS:

PROCEDURE:     DXR - DXR PORTABLE CHEST SINGLE VIEW  - June 12, 2012  [DATE]
RESULT:     Cardiomegaly. Cardiomegaly is severe and stable from 06/09/2012.
Cardiac pacer noted with lead tips in right atrium and right ventricle.
Atelectasis versus infiltrate right lung base. No pulmonary edema.

[ap]
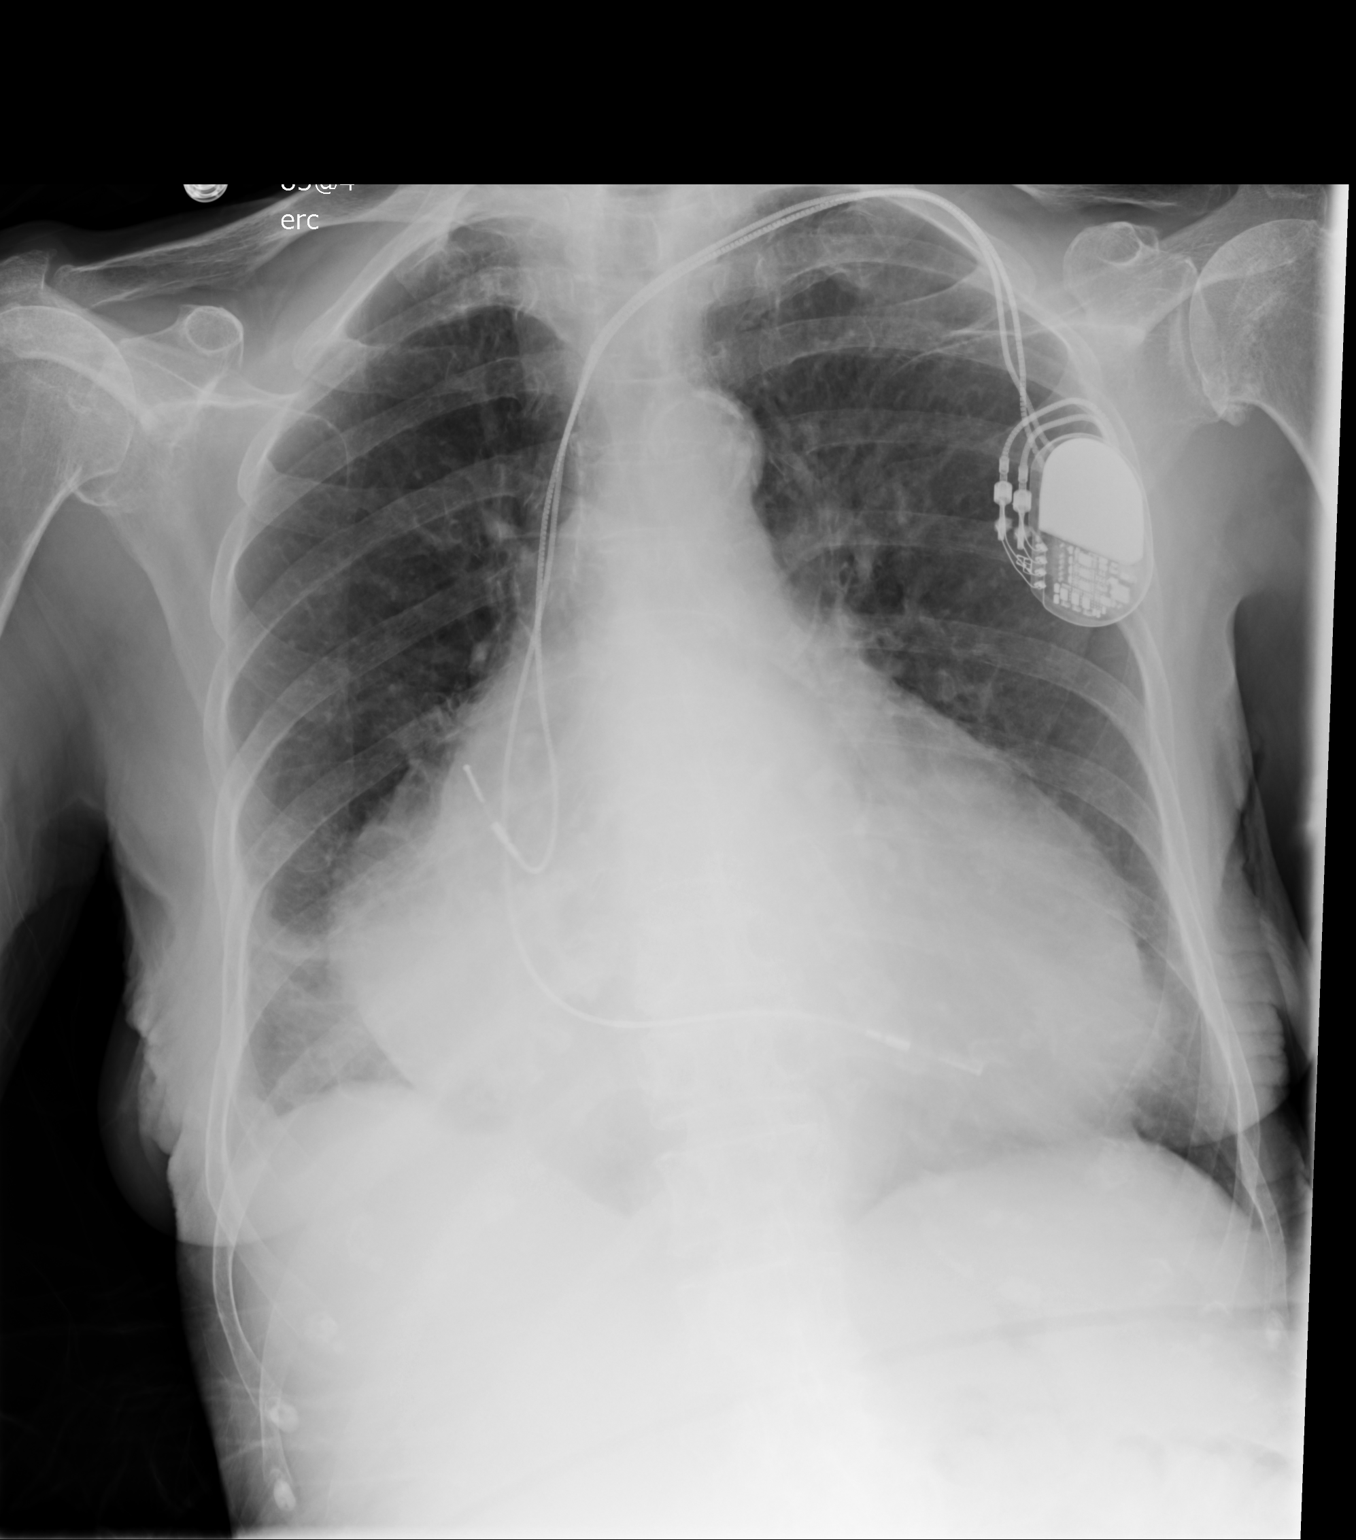

[1 of 1 positions shown; findings below may reference images not displayed]

IMPRESSION: Stable severe cardiomegaly. Mild atelectasis versus
infiltrate right lung base. Similar findings noted on 06/09/2012.

Dictation site: One

## 2013-07-19 IMAGING — CT CT HEAD WITHOUT CONTRAST
1 series · 16 of 30 positions shown, 20 images · non-contrast
Comparison: none

REASON FOR EXAM: head trauma
COMMENTS:

[Series 2: soft tissue · axial · 0.40mm/px · z∈[-97,+38]mm · 16 of 30 slices shown, 20 images]
[im 2/30  brain]
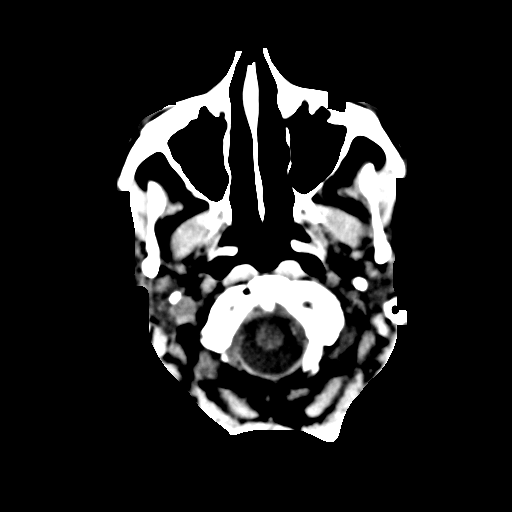
[im 2/30  bone]
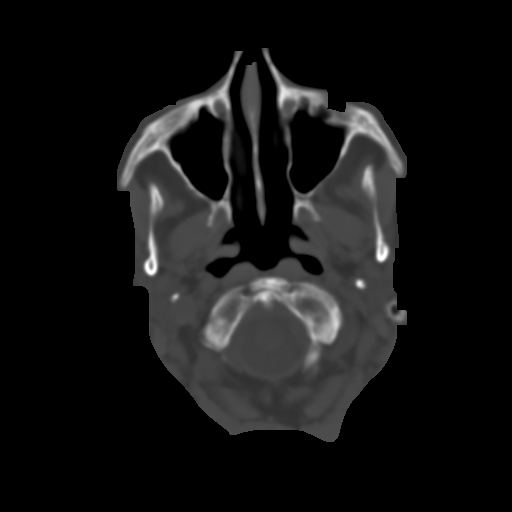
[im 4/30  brain]
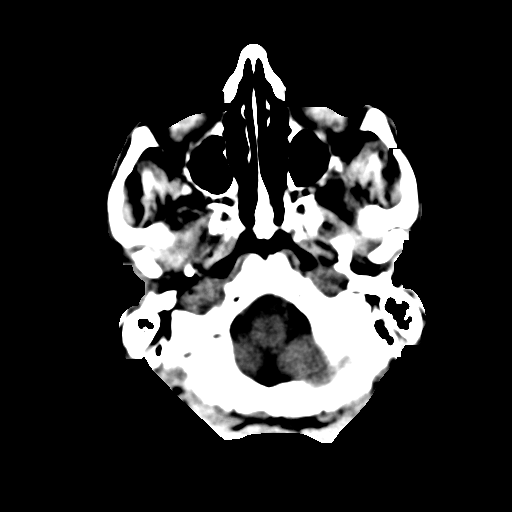
[im 6/30  brain]
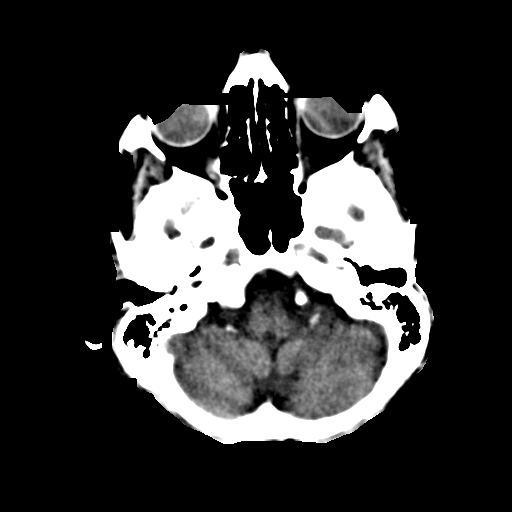
[im 8/30  brain]
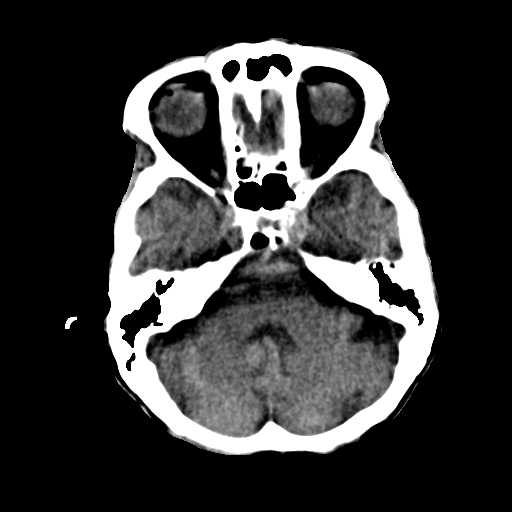
[im 9/30  brain]
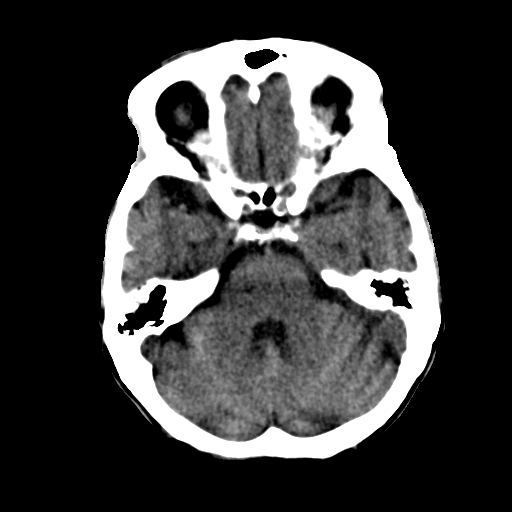
[im 9/30  bone]
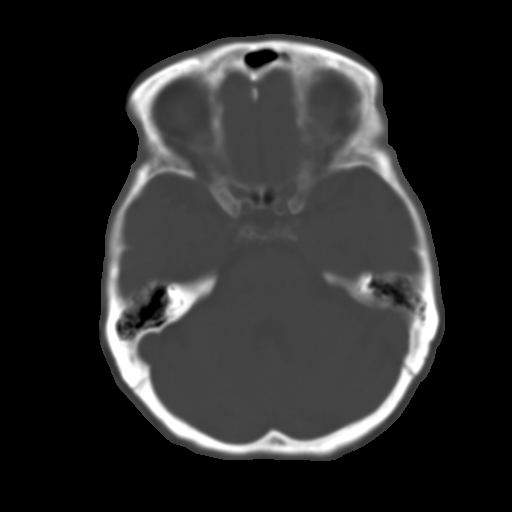
[im 11/30  brain]
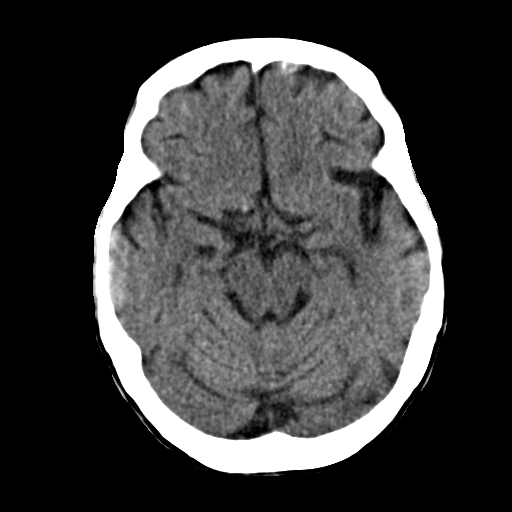
[im 13/30  brain]
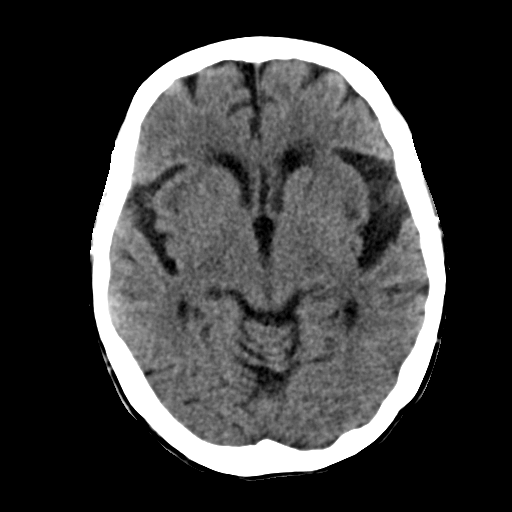
[im 15/30  brain]
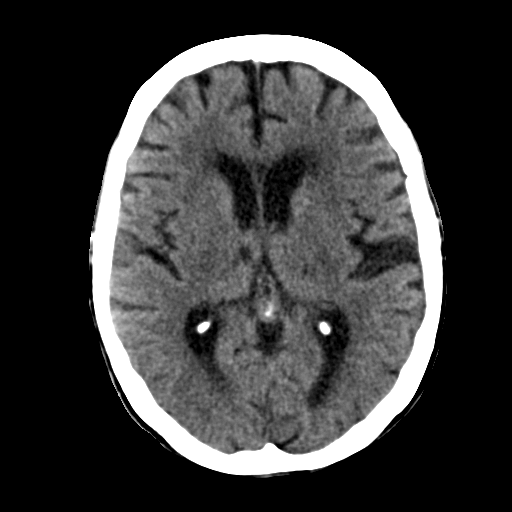
[im 16/30  brain]
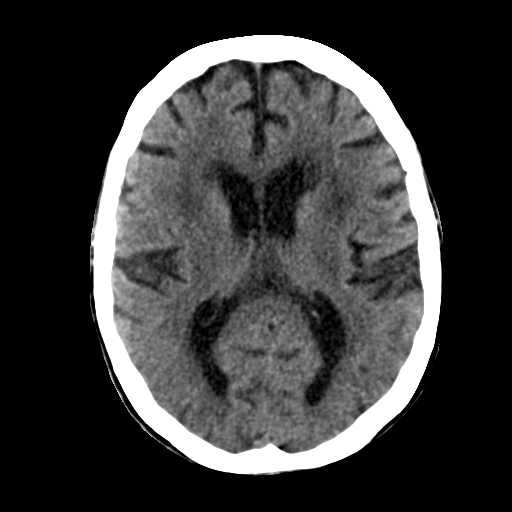
[im 16/30  bone]
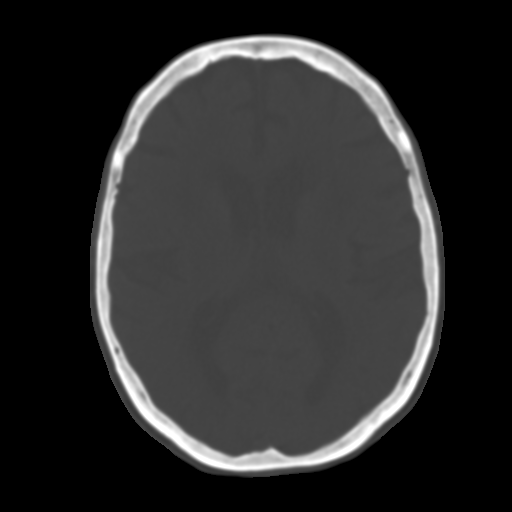
[im 18/30  brain]
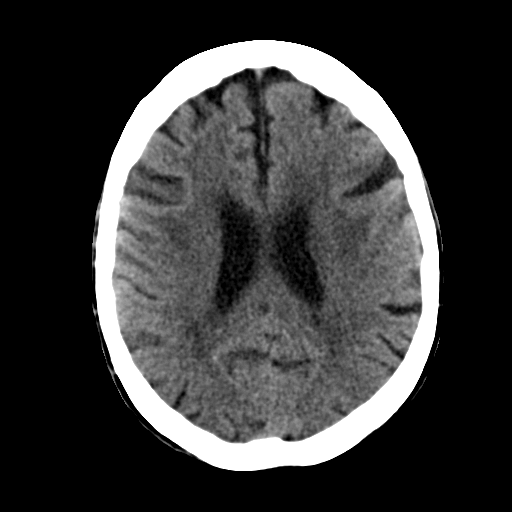
[im 20/30  brain]
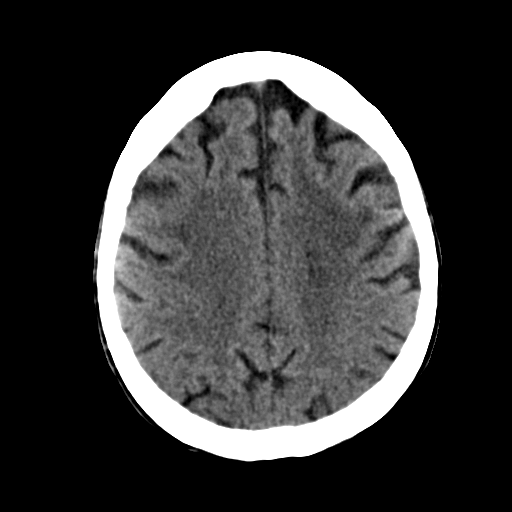
[im 22/30  brain]
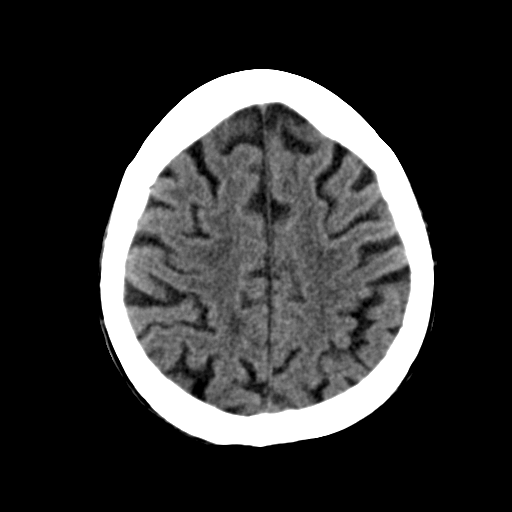
[im 23/30  brain]
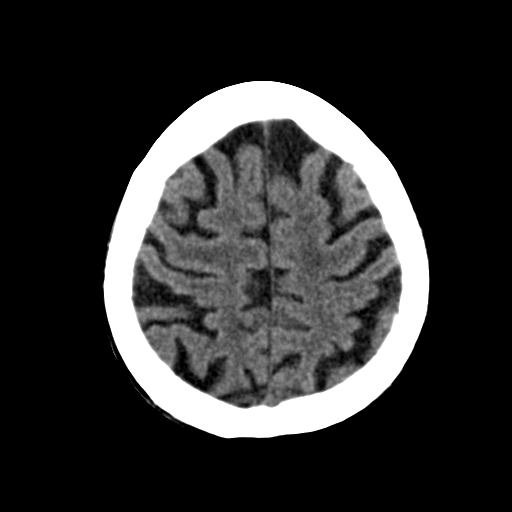
[im 23/30  bone]
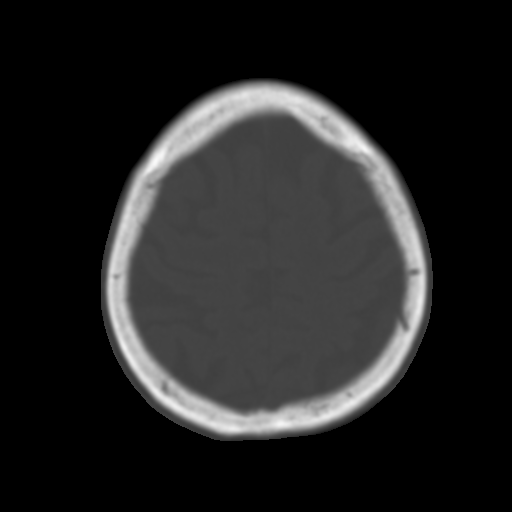
[im 25/30  brain]
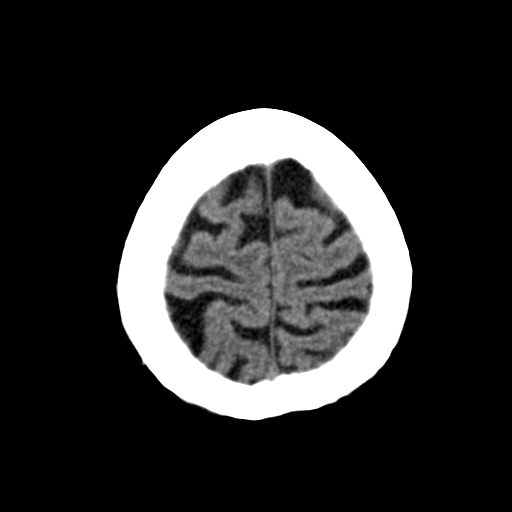
[im 27/30  brain]
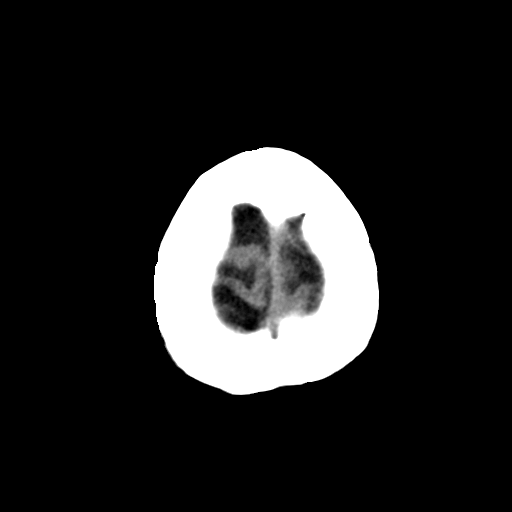
[im 29/30  brain]
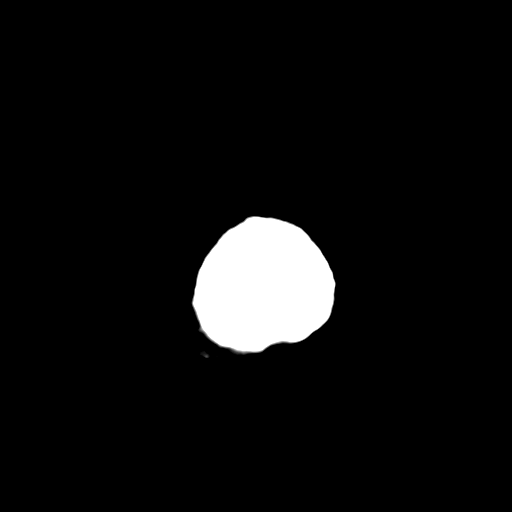

[16 of 30 positions shown; findings below may reference images not displayed]

PROCEDURE:     CT  - CT HEAD WITHOUT CONTRAST  - July 24, 2012  [DATE]

RESULT:     Noncontrast emergent CT of the brain is compared to the previous
exam dated for June 2012.

There is prominence of the ventricles and sulci consistent with atrophy.
There is low-attenuation diffusely in the periventricular and subcortical
white matter consistent with chronic microvascular ischemic disease. Basal
ganglia lacunar infarcts are present bilaterally. There is no hemorrhage,
mass or mass effect. No evolving infarct is evident. The included sinuses
and mastoid air cells show normal appearing aeration. The calvarium is
intact. There is a scalp hematoma over the high right parietal region.
IMPRESSION: 1. Atrophy with chronic microvascular ischemic disease an old basal ganglia
lacunar infarcts. No acute intracranial abnormality or significant interval
change.

[REDACTED]

## 2013-09-23 IMAGING — CR DG CHEST 2V
1 series · 3 of 3 positions shown · non-contrast
Comparison: none

REASON FOR EXAM: SOB
COMMENTS:

[Series 1: x chest ap · 0.14mm/px · 3 of 3 slices shown]
[im 1/3]
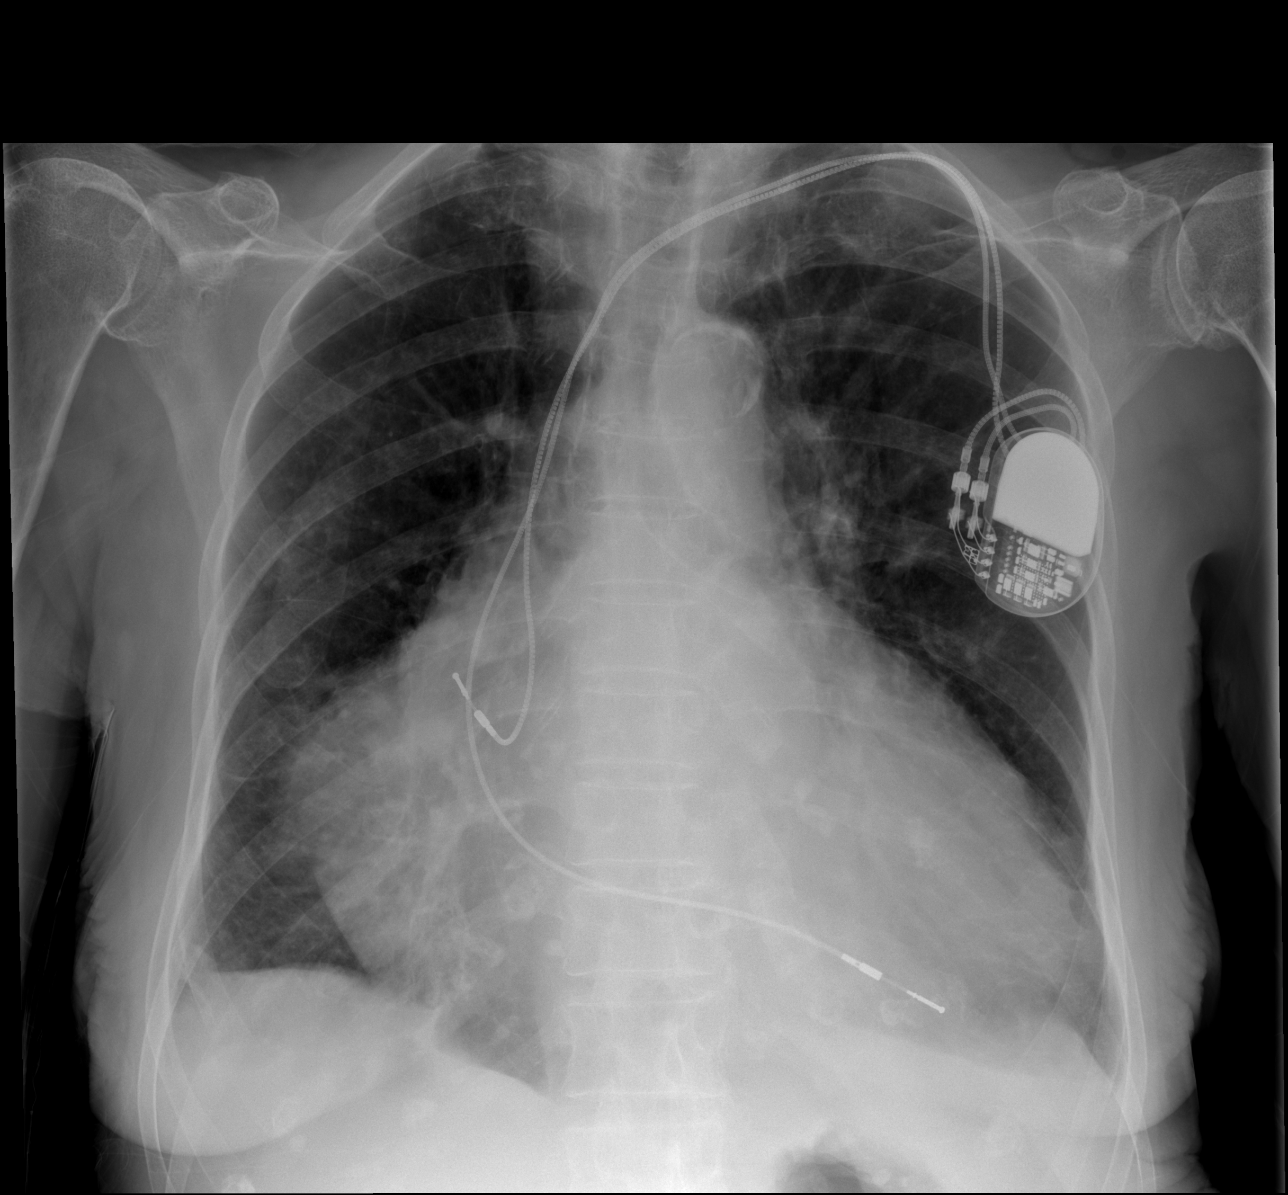
[im 2/3]
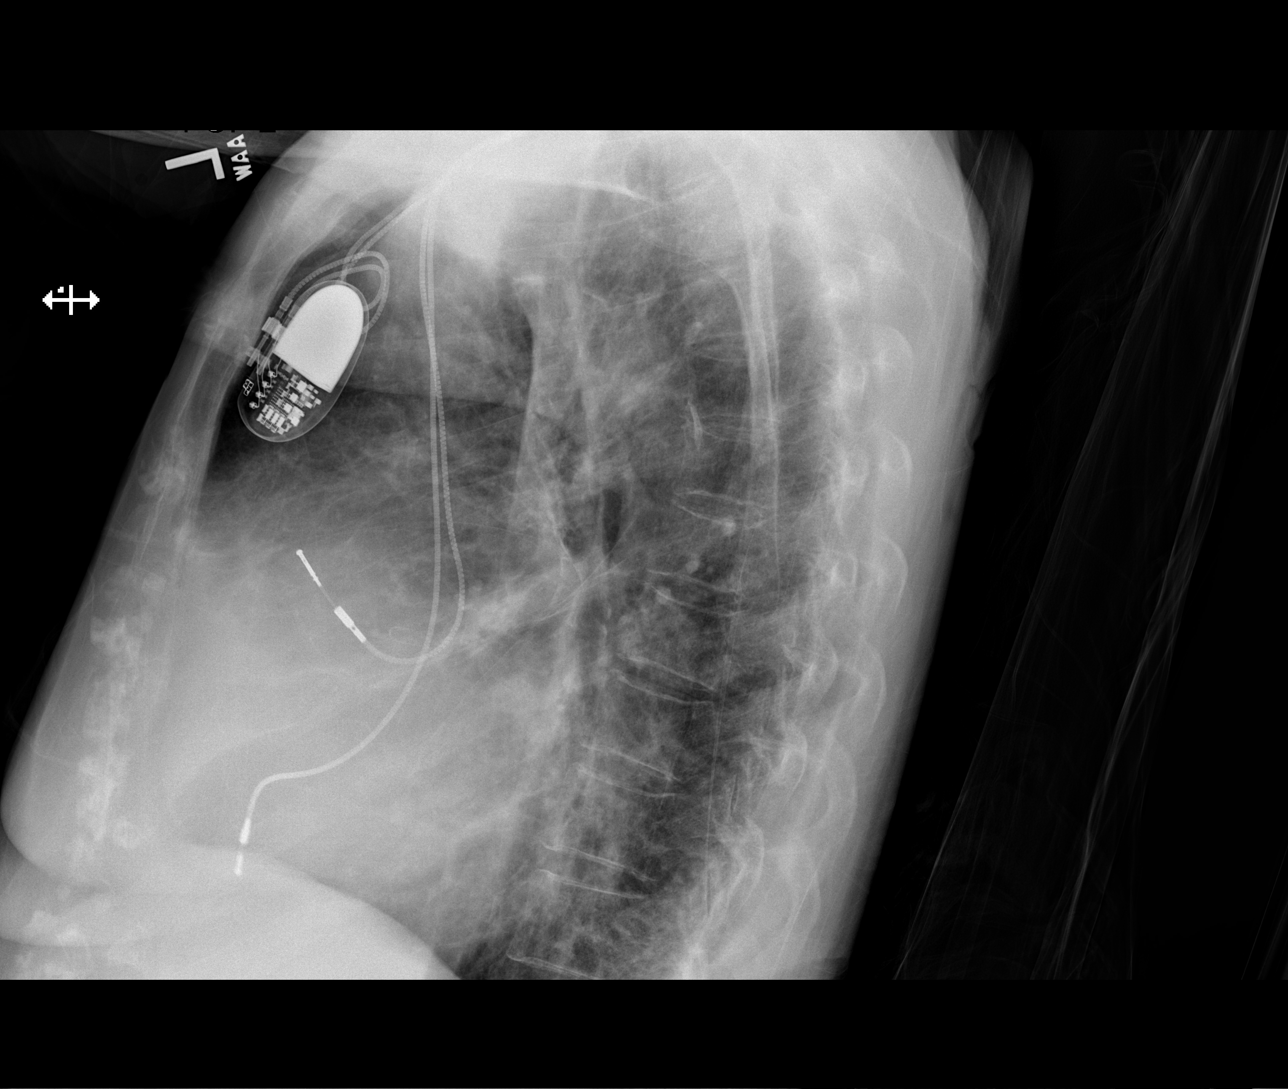
[im 3/3]
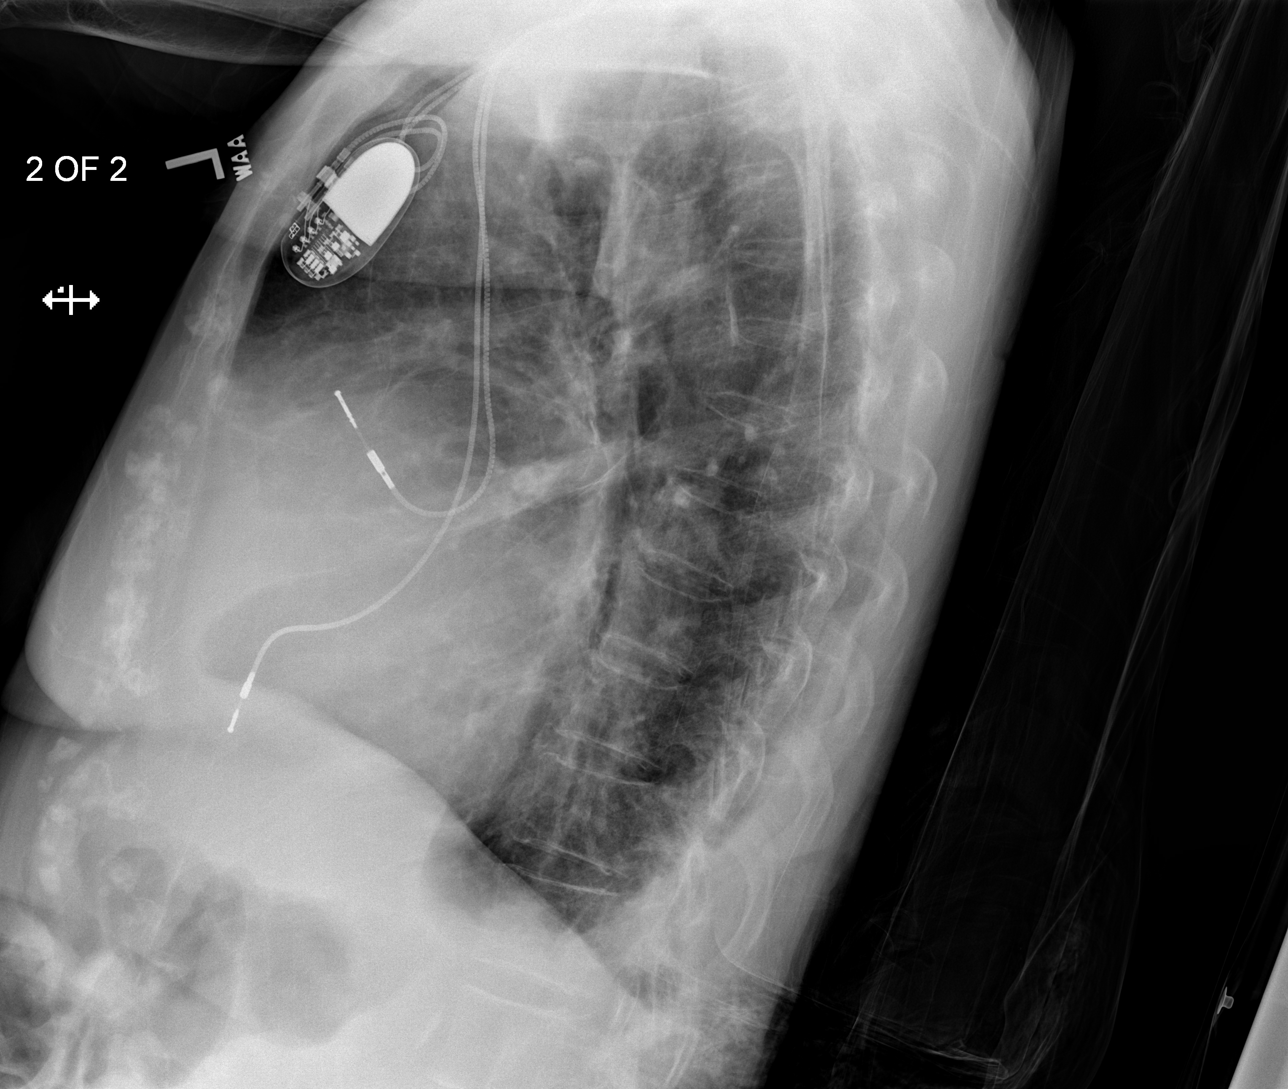

[3 of 3 positions shown; findings below may reference images not displayed]

PROCEDURE:     DXR - DXR CHEST PA (OR AP) AND LATERAL  - September 28, 2012  [DATE]

RESULT:     Comparison is made to the exam dated 12 June, 2012. There is a
left-sided 2-lead pacemaker present. The heart is massively enlarged but
unchanged. Atherosclerotic calcification is noted within the aorta. The
lungs are hyperinflated consistent with COPD possibly with trace right
pleural effusion. There is some atelectasis or minimal infiltrate at the
right lung base. The aeration appears to be improved compared to the
previous study.
IMPRESSION: 1. COPD with stable cardiomegaly and a trace right pleural effusion or
pleural thickening with some right lung base atelectasis or pneumonia.

[REDACTED]

## 2014-08-11 ENCOUNTER — Emergency Department: Payer: Self-pay | Admitting: Emergency Medicine

## 2014-09-02 ENCOUNTER — Encounter: Payer: Self-pay | Admitting: Surgery

## 2014-12-24 NOTE — Consult Note (Signed)
General Aspect 81 yo female with history of alterred mental status treated for rll pneumonia for several monthes. She is now admitted with progressive confusion and reduced mental status. She was noted to have a pericardiac effusion on ct scan of the chest several months ago and ct scan during this admission reveals worsening of the effusion. Sh is not able to given any history and her daughter states she has become more weak and fatigued recently. Echo done today does not reveal and significant tamponade physilogy but does reveal at [facility] moderate pericardial effusion. She has a history of atrial fibreillation and sick sinus syndrome with a permenant pacemaker in place. She was not felt to be a coumadin candidate due to fall risk. She was seen by Dr. Nehemiah Massed on 06/07/12. She has hemoptosis of unclear etilogy . Repeat bronchoscopy is pending.   Physical Exam:   GEN critically ill appearing    HEENT dry oral mucosa    RESP rhonchi    CARD Tachycardic    ABD denies tenderness    LYMPH negative neck    EXTR negative cyanosis/clubbing    SKIN normal to palpation    PSYCH stuporous   Review of Systems:   ROS Pt not able to provide ROS    Medications/Allergies Reviewed Medications/Allergies reviewed     hypopthyroidism:    pneumonia:    "mini stroke":    a fib:    dysphagia:    bulging discs:    scoliosis:    colon spasms:    MI - Myocardial Infarct:    diverticulitis:    hyperlipidemia:    HTN:    arthritis:    GERD - Esophageal Reflux:    Cataract Removal right eye:    tonsillectomy:    appendectomy:   Home Medications: Medication Instructions Status  metoprolol tartrate 100 mg oral tablet 1 tab(s) orally 2 times a day Active  Arthrotec 50 mg-200 mcg oral tablet 1 tab(s) orally 2 times a day Active  aspirin 81 mg oral tablet 1 tab(s) orally once a day (in the morning) Active  Lanoxin 125 mcg (0.125 mg) oral tablet 1 tab(s) orally once a day (in the  morning) Active  levothyroxine 50 mcg (0.05 mg) oral tablet 1 tab(s) orally once a day (in the morning) 30 minutes before breakfast Active  Mucinex Max Strength 1200 mg oral tablet, extended release 1 tab(s) orally 2 times a day Active  omeprazole 20 mg oral delayed release capsule 1 cap(s) orally 2 times a day 30 minutes before breakfast and dinner Active  potassium chloride 10 mEq oral tablet, extended release 1 tab(s) orally once a day (in the morning) Active  Tessalon Perles 100 mg oral capsule 1 cap(s) orally 3 times a day Active  torsemide 10 mg oral tablet 1 tab(s) orally once a day (in the morning) Active  tramadol 50 mg oral tablet 1 tab(s) orally 3 times a day Active  Tylenol Caplet Extra Strength 500 mg oral tablet 2 tab(s) orally once a day (in the morning), 1 tablet at lunch, 1 tablet at supper, and 2 tablets at bedtime Active  nystatin 100,000 units/mL oral suspension 10 milliliter(s) orally 4 times a day Active  Vitamin B12 1000 mcg/mL injectable solution 1 milliliter(s) injectable once a month on the 20th Active  polyethylene glycol 3350 oral powder for reconstitution 17 gram(s) orally once a day (at bedtime), As Needed- for Constipation  Active  Nasonex 50 mcg/inh nasal spray 2 spray(s) nasal once a  day, As Needed for congestion Active  Nitrostat 0.4 mg sublingual tablet 1 tab(s) sublingual every 5 minutes, As Needed- for Chest Pain  Active  ProAir HFA 90 mcg/inh inhalation aerosol 2 puff(s) inhaled 4 times a day Active   EKG:   Interpretation tachycardia with pacs.    Morphine: N/V, Other  Penicillin: Unknown  Sulfa: Unknown  Codeine: Unknown  Demerol: Unknown  Erythromycin: Unknown  Darvocet - N: Unknown  Clindamycin: GI Distress  Adhesive: Unknown    Impression 81 yo female with history of recurrent pneumonia, altered mental status, weakness and fatigue admitted iwth hemoptosis. Has had recurent pneumonia which has been evalauated in the past with probable repeat  bronchosicopy pending. She has continue to lose weight and become very weak. She is not able to give a histoyr today. CHest ct revealed worsening of the pericardial effusion noted on chest ct in June. Echo reveals moderate pericardial effusion with no tamponade at present. Had a long discussion with patients daughter as patient is not able to give history. Discussed pericardial effusion and treatement possibilities to include pericardiacentsis vs pericardial window. Pt daughter does not wih pericardiocentisis to be carried out at present . Pt is not in tamponade at present. Will need to carefully follow for evidence of this, ie, hypotenison and tachycardia and echo evidence. Will need to limit diuresis to avoid reduceing preload. Eitology of effusion is likely inflamatory vs perin=neumonic vs malignant.    Plan 1, Conitnue to aggressively treate the pneumonia 2. Not a candidate for chronic anticoagulaiton at present. 3. Repeat echo early next week or earlier if patient developes hypotension or other evidence of tamponade. 4. Contineu current meds 5. Connitue to work up hemoptosis 6. Will follow with you.   Electronic Signatures: Teodoro Spray (MD)  (Signed 04-Oct-13 14:26)  Authored: General Aspect/Present Illness, History and Physical Exam, Review of System, Past Medical History, Home Medications, EKG , Allergies, Impression/Plan   Last Updated: 04-Oct-13 14:26 by Teodoro Spray (MD)

## 2014-12-24 NOTE — Discharge Summary (Signed)
PATIENT NAME:  [name], [name] MR#:  [id number] DATE OF BIRTH:  [birth year]  DATE OF ADMISSION:  06/09/2012 DATE OF DISCHARGE:  06/15/2012   TYPE OF DISCHARGE: The patient is transferred to a skilled nursing facility.   REASON FOR ADMISSION: Confusion with hemoptysis and cough.   HISTORY OF PRESENT ILLNESS: The patient is an 81 year old female initially admitted in June of 2013 with right lower lobe pneumonia which was not responsive to standard antibiotic therapy. She underwent bronchoscopy at that time which showed no postobstructive lesion but did show inflammation. Over the summer, the patient has continued to have persistent right lower lobe infiltrate. More recently, she developed worsening cough with hemoptysis. She also was noted to be confused. She was brought to the Emergency Room where she had a white count of 19,000 with persistent changes in the right lower lobe consistent with pneumonia. She was admitted for further evaluation.   PAST MEDICAL HISTORY:  1. Persistent right lower lobe consolidation as per history of present illness.  2. Benign hypertension.  3. Atherosclerotic cardiovascular disease.  4. Chronic atrial fibrillation.  5. Chronic anemia.  6. Gastroesophageal reflux disease.  7. History of esophageal stricture.  8. Hyperlipidemia.  9. Osteoarthritis.  10. History of pulmonary embolism. 11. History of diverticulosis.  12. History of esophageal ulcer.  13. Sick sinus syndrome, status post pacemaker implant.   MEDICATIONS ON ADMISSION: Please see admission note.   ALLERGIES: Penicillin, morphine, clindamycin, sulfa, codeine, Demerol, erythromycin, and Darvocet.   SOCIAL HISTORY: Negative for alcohol or tobacco abuse.   FAMILY HISTORY: Positive for coronary artery disease and lung cancer.   REVIEW OF SYSTEMS: As per admission note.   PHYSICAL EXAMINATION: The patient was chronically ill appearing but in no acute distress. Vital signs were stable. HEENT exam was  unremarkable. NECK was supple without JVD. LUNGS revealed decreased breath sounds at the right base. CARDIAC exam revealed an irregularly irregular rhythm. ABDOMEN was soft and nontender. EXTREMITIES were without edema. NEUROLOGIC exam was grossly nonfocal.   HOSPITAL COURSE: The patient was admitted with persistent right lower lobe infiltrate with hemoptysis. Her confusion initially resolved. It was felt that she may have had a TIA. She was placed on IV antibiotics in the hospital and was seen in consultation by both Pulmonology and Cardiology. Cardiology recommended conservative care with medical management for her underlying heart disease and atrial fibrillation. Because of her frail state, repeat bronchoscopy was deferred by Pulmonology. It was felt that the patient required antibiotics for six weeks to try to strengthen her. Bronchoscopy will then be considered. She remained weak in the hospital. Her cough and hemoptysis did improve. She remained hemodynamically stable. Cultures came back positive for MRSA. Infectious Disease was consulted and her IV antibiotic regimen was adjusted. A PICC line was placed for long-term IV antibiotic therapy. A bed was found at a skilled nursing facility. She is now transferred there for further care and rehabilitation.   DISCHARGE DIAGNOSES:  1. MRSA pneumonia.  2. Hemoptysis with cough.  3. Transient ischemic attack.  4. Chronic atrial fibrillation.  5. Atherosclerotic cardiovascular disease.  6. Benign hypertension.  7. Hyperlipidemia.  8. Sick sinus syndrome, status post pacemaker implant.  9. History of esophageal stricture, status post dilatation.  10. Osteoarthritis.   DISCHARGE MEDICATIONS:  1. Albuterol 2 puffs q.i.d.  2. DuoNeb SVNs q.4 hours p.r.n. shortness of breath.  3. Tessalon 100 mg p.o. b.i.d.   4. Lanoxin 0.125 mg p.o. daily.  5. Synthroid 50 mcg p.o.  daily.  6. Lopressor 50 mg p.o. q.8 hours.  7. Nasonex 2 puffs in each nostril at  bedtime.  8. Nitrostat 0.4 mg sublingually p.r.n. chest pain.  9. Nystatin suspension 10 mL p.o. t.i.d.   10. Omeprazole 20 mg p.o. b.i.d.  11. Ultram 50 mg p.o. q.6 hours p.r.n. pain.  12. Megace suspension 400 mg p.o. q.a.m.  13. Tussionex 1 tsp p.o. q.12 hours p.r.n. cough.  14. MiraLAX 17 grams p.o. q.12 hours p.r.n. constipation.  15. Ceftaroline 400 mg IV b.i.d. via PICC line.   FOLLOW-UP PLANS AND APPOINTMENTS:  1. The patient will be followed by resident physician at the skilled nursing facility.  2. She will be on the IV antibiotics per protocol with her PICC line being flushed per protocol.  3. She is a NO CODE BLUE, DO NOT RESUSCITATE.  4. She is on a soft diet.  5. She will be followed by physical therapy and dietary.  6. Will obtain a CBC and a MET-B in one week.  7. She will follow-up with me at the [facility] in two weeks for further evaluation of her antibiotic regimen at that time.   ____________________________ Leonie Douglas Doy Hutching, MD jds:drc D: 06/15/2012 07:37:32 ET T: 06/15/2012 08:25:40 ET JOB#: [id number]  cc: [name], MD, <Dictator> JEFFREY [name] MD ELECTRONICALLY SIGNED 06/15/2012 10:26

## 2014-12-24 NOTE — Consult Note (Signed)
Impression: 81yo WF w/ h/o chronic RLL pneumonia admitted with possible TIA and persistent hemoptysis and RLL pneumonia.  Her prior CT scans showed possible post obstructive pneumonia, but a definitive mass or malignancy has not been found.  She has undergone bronchoscopy once without a diagnosis.  Her recent cultures are growing Methacillin Resistant Staph aureus and Stenotrophomonas. With the continued possibility of a post obstructive process, it may be difficult to treat.  Will need to cover the Methacillin Resistant Staph aureus and possible anaerobes given the length of time she has been ill. Will give another day or so of IV therapy.  Will then change to po regimen.  She will likely need several weeks of antibiotics. Discussed with pulmonary.  Dr. Raul Del to consider repeat bronchoscopy once she is stronger.  Electronic Signatures: Brileigh Sevcik, Heinz Knuckles (MD)  (Signed on 08-Oct-13 15:58)  Authored  Last Updated: 08-Oct-13 15:58 by Leonia Heatherly, Heinz Knuckles (MD)

## 2014-12-24 NOTE — Consult Note (Signed)
PATIENT NAME:  [name], [name] MR#:  [id number] DATE OF BIRTH:  1922/10/12  DATE OF CONSULTATION:  04/06/2012  REFERRING PHYSICIAN:  Dr. Mortimer Fries  CONSULTING PHYSICIAN:  Sammuel Hines. Richardson Landry, MD  REASON FOR CONSULTATION: Laryngeal lesion.   HISTORY OF PRESENT ILLNESS: This 81 year old female was brought in the hospital with shortness of breath, bloody sputum, and tachycardia and found to have right lower lobe pneumonia. She underwent a bronchoscopy earlier today by Dr. Mortimer Fries and he noted the lesion on the larynx and has requested consultation to evaluate this lesion. She does have a history of chronic reflux and has had difficulty swallowing for some time. She apparently had a barium swallow in the distant past but none recently. She has also had esophageal dilatation in the past. Her main issue with her throat leading up to this evaluation has been difficulty swallowing with no chronic throat pain or complaints of repeated ear pain. There is apparently a history of possible mini strokes in the past.   PAST MEDICAL HISTORY:  1. Hypertension. 2. Coronary artery disease. 3. Gastroesophageal reflux.   4. Anemia.  5. Hyperlipidemia.  6. Osteoarthritis.  7. History of pulmonary embolus. 8. Esophageal ulcer. 9. Diverticulosis. 10. Atrial fibrillation with rapid ventricular rate.   PAST SURGICAL HISTORY:  1. Pacemaker placement.  2. Esophageal dilatation.   SOCIAL HISTORY: The patient is a nonsmoker. Denies alcohol use or drug use.   ALLERGIES: Clindamycin, codeine, Darvocet, Demerol, erythromycin, morphine, penicillin, sulfa, adhesive.   MEDICATIONS:  1. Tylenol 325 mg 1 to 2 every four hours as needed for pain. 2. Aspirin 81 mg orally daily.  3. Dextromethorphan and Guaifenesin liquid 5 to 10 mL every six hours as needed for cough. 4. Arthrotec 1 tablet orally b.i.d.  5. Heparin 5000 units subcutaneous q.12 hours. 6. Meropenem 500 mg IV q.12 hours. 7. Lopressor 100 mg p.o. b.i.d.  8. Nitrostat  0.4 mg sublingual q.5 minutes p.r.n. chest pain.  9. Nystatin 5 mL p.o. q.8 hours. 10. Omeprazole 20 mg p.o. q.a.m.  11. MiraLAX 17 grams orally daily p.r.n. constipation. 12. Phenergan 12.5 mg IV q.4 to 6 hours p.r.n. nausea. 13. Phenergan 12.5 to 25 mg p.o. q.6 hours p.r.n. nausea. 14. Tramadol 50 mg p.o. q.6 hours p.r.n. pain.  15. Digoxin 0.125 mg p.o. daily.  REVIEW OF SYSTEMS: Positive for weakness, fatigue, cough, shortness of breath, tachycardia. She has not had nausea, vomiting, or abdominal pain. Chronic low back pain with arthritis.   PHYSICAL EXAMINATION:   VITAL SIGNS: Temperature 97.8, pulse 104, respirations 18, blood pressure is 142/85.   GENERAL: Well developed, well nourished female in no acute distress. She appears to still be recovering from sedation from her bronchoscopy.   HEAD AND FACE: Head is normocephalic, atraumatic. No facial skin lesions. Facial strength is normal and symmetric.   EARS: External ears are unremarkable. Ear canals are free of cerumen. Tympanic membranes are clear bilaterally.   NOSE: External nose unremarkable. Nasal cavity is clear. No purulence or polyps are seen.   ORAL CAVITY AND OROPHARYNX: Teeth, lips, and gums unremarkable. Tongue and floor of mouth without lesions. Posterior pharynx is clear. The tonsils have been removed. There is no exudate.   NECK: Neck is supple without adenopathy or mass. There is no thyromegaly. Salivary glands are soft and nontender without masses.   NEUROLOGIC EXAM: Cranial nerves II through XII are grossly intact.   DATA REVIEW: Reviewed the results of her chest x-ray. She also had a CT of the  chest with contrast showing low attenuation density filling the right bronchus intermedius as well as the bronchi in the right middle and lower lobes with opacities in the right middle and lower lobes concerning for worsening pneumonia and a right pleural effusion. She also had 3 to 4 mm nodules in the upper lobes.    ASSESSMENT: This patient has a lesion on the larynx. That was noted on bronchoscopy.  PLAN: Fiberoptic nasal laryngoscopy was performed today. She was found to have a small granuloma involving the right arytenoid. This is likely related to her longstanding reflux. She was also found to have a small hemorrhage of the left vocal cord creating minor hematoma without airway obstruction. Vocal cords were otherwise mobile. No other lesions of the hypopharynx, larynx, or tongue base were seen which would contribute to swallowing difficulties.   Would recommend increasing her reflux management to Prilosec 40 mg prior to her evening meal and stressing reflux precautions. This lesion does not require immediate evaluation or biopsy and may be followed as an outpatient. I would suggest seeing her back in my office in 6 to 8 weeks after discharge.   ____________________________ Sammuel Hines. Richardson Landry, MD psb:drc D: 04/06/2012 17:37:13 ET T: 04/07/2012 09:40:23 ET JOB#: [id number]  cc: Sammuel Hines. Richardson Landry, MD, <Dictator> Riley Nearing MD ELECTRONICALLY SIGNED 04/11/2012 11:28

## 2014-12-24 NOTE — H&P (Signed)
PATIENT NAME:  [name], [name] MR#:  [id number] DATE OF BIRTH:  09-02-1923  DATE OF ADMISSION:  04/03/2012  PRIMARY CARE PHYSICIAN: Fulton Reek, MD of [facility]   ER REFERRING PHYSICIAN: Belva Bertin, MD    CHIEF COMPLAINT: Weakness, shortness of breath, bloody sputum, tachycardia.   HISTORY OF PRESENT ILLNESS: The patient is an 81 year old Caucasian female patient with history of atrial fibrillation, not on anticoagulation, coronary artery disease, and recent right lower lobe pneumonia in June 2013, presents to the Emergency Room complaining of extreme weakness, mild bloody sputum, some chest pain and tachycardia. The patient was found to be in atrial fibrillation fluctuating between 100 to 130, needing oxygen support with saturations going as low as 80% on room air and chest x-ray showing right lower lobe pneumonia. The patient is being admitted for management of right lower lobe pneumonia, acute respiratory failure and atrial fibrillation with rapid ventricular rate.   The patient was on levofloxacin, discharged on 02/23/2012. She had two chest x-rays as outpatient, as per the daughter at bedside, which showed residual pneumonia of right lower lobe; but presently the patient seems to have worsening shortness of breath, some bloody sputum and extreme weakness, and chest x-ray showing the same right lower lobe pneumonia. This could be the same old pneumonia versus a new pneumonia. The patient does have some baseline dysphagia, had her esophagus dilated in the past, could be aspiration pneumonia, too.   The patient complains of pain in her right lower chest which is similar to her prior pneumonia, which is pleuritic, but had an episode of midsternal chest pain which seemed to resolve with nitroglycerin in the Emergency Room as per ER physician. This was nonradiating with no aggravating or alleviating factors except the nitroglycerin. It was associated with shortness of breath.   PAST MEDICAL  HISTORY:  1. Hypertension. 2. Coronary artery disease. 3. Gastroesophageal reflux disease with esophageal dilatation in the past. 4. Anemia. 5. Hyperlipidemia. 6. Osteoarthritis. 7. PE. 8. Esophageal ulcer.  9. Diverticulosis.  10. Atrial fibrillation with rapid ventricular rate.   PAST SURGICAL HISTORY:  1. A pacemaker placement.  2. Esophageal dilatation.    SOCIAL HISTORY: The patient lives at home. She used to ambulate with a cane but since her prior discharge has been ambulating with a walker. She does not smoke. No alcohol, no illicit drugs.   CODE STATUS: DNR/DNI.   FAMILY HISTORY: Reviewed and unknown.   REVIEW OF THE SYSTEMS: CONSTITUTIONAL: Feels extremely fatigued, weak, complains of chest pain. EYES: No blurred vision or redness. ENT no nasal congestion or nasal discharge. RESPIRATORY: Has a cough, sputum and shortness of breath. CARDIOVASCULAR: Has tachycardia. No edema. No palpitations. GASTROINTESTINAL: No nausea, vomiting, abdominal pain. GENITOURINARY: No dysuria or hematuria. ENDOCRINE: No polyuria, polydipsia or thyroid swelling. HEMATOLOGIC/LYMPHATIC: Anemia positive. No easy bruising or bleeding. SKIN: No rash or ulcers. MUSCULOSKELETAL: Has chronic low back pain and arthritis. NEUROLOGICAL: No numbness, weakness, dysarthria. PSYCHIATRIC: No bipolar disorder or depression.   ALLERGIES: Clindamycin, codeine, Darvocet, Demerol, erythromycin, morphine, penicillin, sulfa, and adhesive tape.   HOME MEDICATIONS:  1. Acetaminophen 500 mg, 2 tablets orally in the morning, 1 tablet at night.  2. Arthrotec 50 to 100 mcg orally b.i.d.  3. Aspirin 81 mg oral once a day.  4. Bengay apply topically to affected areas once a day.  5. Combivent 1 puff inhaled q.i.d. as needed.  6. Furosemide 40 mg, 1/2 tablet every other day in the morning.  7. Metoprolol 100 mg  oral b.i.d.  8. Nasonex 50 mcg, 2 sprays nasally once a day.  9. Nitrostat 0.4 mg sublingual every 5 minutes as  needed for chest pain.  10. Nystatin 100,000 units, 5 mL oral t.i.d. after breakfast, lunch and dinner.  11. Omeprazole 20 mg oral once a day.  12. Polyethylene glycol 17 grams oral once a day as needed for constipation.  13. Tramadol 50 mg, 1 to 2 tablets every 6 hours as needed for pain.  14. Vitamin B12 injectable solution once a month.   PHYSICAL EXAMINATION:  VITAL SIGNS: Temperature 97, pulse fluctuating between 96 to 130, respirations 22, blood pressure 104/62, saturating 98% on 2 liters oxygen, did desaturate into the 80s while I was in the room on room air.   GENERAL: Frail, elderly Caucasian female patient lying in bed, comfortable, conversational, cooperative with exam.   PSYCHIATRIC: Alert and oriented x3. Mood and affect appropriate. Judgment intact.   HEENT: Atraumatic, normocephalic. Oral mucosa moist and pink. External ears and nose normal. No pallor. No icterus. Has a poor cough.   CARDIOVASCULAR: S1, S2, tachycardic, irregular. Peripheral pulses 2+. No edema or JVD.   RESPIRATORY: Has basilar crackles on the left lower area, right side clear. No wheezing or rhonchi. Good air entry on both sides. Decreased air entry right lower lobe. Not using any accessory muscles.   GASTROINTESTINAL: Soft abdomen, nontender bowel sounds present. No hepatosplenomegaly palpable.   SKIN: No petechiae, rash, ulcers. Does have pale dry skin.   MUSCULOSKELETAL: No joint swelling, redness, or effusion of the large joints. Has right lower chest pleuritic tenderness.   NEUROLOGICAL: Motor strength 5/5 in upper and lower extremities. Sensation to fine touch intact all over. Cranial nerves II through XII are intact.  LYMPHATIC: No cervical lymphadenopathy.  LABORATORY, DIAGNOSTIC AND RADIOLOGICAL DATA: Laboratory studies show glucose of 100,  BUN 21, creatinine 1.12, sodium 133, potassium 4.4, GFR 44, albumin 2.1, CK of 16. Troponin less than 0.02, CK-MB 0.7. EKG shows atrial fibrillation with  rapid ventricular rate. No acute ST-T wave changes. Chest x-ray shows right lower lobe pneumonia. No pleural effusion found.   ASSESSMENT AND PLAN:  1. Acute respiratory failure secondary to right lower lobe pneumonia.  2. Right lower lobe pneumonia. This could be old versus new pneumonia in a patient with dysphagia. We will get swallow evaluation. The patient will be n.p.o. until she gets the swallow evaluation. We will start her on meropenem for anaerobic coverage at this time as the patient is allergic to clindamycin and penicillin. We will also put her on a flutter valve as she has a poor cough, guaifenesin and nebulizers as needed.  3. Atrial fibrillation with rapid ventricular rate secondary to pneumonia: We will continue on metoprolol, use IV medications if needed. Blood pressure is borderline low. Have to closely monitor the patient.  4. Sepsis with tachycardia and acute respiratory failure: Await WBC count at this time.  5. Coronary artery disease with chest pain: This is likely secondary to her pneumonia and the atrial fibrillation. We will get two more sets of cardiac enzymes.  6. Deep venous thrombosis prophylaxis with heparin.   CODE STATUS: DNR/DNI.   TIME SPENT: Time spent today on this case was 60 minutes with more than 50% time spent in coordination of care.   ____________________________ Leia Alf. Tommi Crepeau, MD srs:cbb D: 04/03/2012 11:57:00 ET T: 04/03/2012 12:39:27 ET JOB#: [id number]  cc: Alveta Heimlich R. Annalisse Minkoff, MD, <Dictator> [name], MD Alveta Heimlich Arlice Colt MD ELECTRONICALLY SIGNED  04/05/2012 14:01 

## 2014-12-24 NOTE — Discharge Summary (Signed)
PATIENT NAME:  Ann, Meyers MR#:  [id number] DATE OF BIRTH:  1923/01/17  DATE OF ADMISSION:  04/03/2012 DATE OF DISCHARGE:  04/08/2012  TYPE OF DISCHARGE: The patient is transferred to skilled nursing facility.   REASON FOR ADMISSION: Weakness with shortness of breath and hemoptysis.   HISTORY OF PRESENT ILLNESS: The patient is an 81 year old female with a history of chronic atrial fibrillation and coronary artery disease who has had a persistent right lower lobe pneumonia since June of 2013. She presented to the emergency room where she was found to be hypoxic and tachycardic with hemoptysis. Chest x-ray revealed persistent right lower lobe infiltrate. She was admitted for further evaluation.   PAST MEDICAL HISTORY:  1. Benign hypertension.  2. Atherosclerotic cardiovascular disease.  3. Gastroesophageal reflux disease.  4. Chronic anemia.  5. Hyperlipidemia.  6. Osteoarthritis.  7. Chronic atrial fibrillation.  8. Diverticulosis.  9. History of pulmonary embolism.  10. History of esophageal ulcer.  11. Status post pacemaker implant.   MEDICATIONS ON ADMISSION: Please see admission note.   ALLERGIES: Erythromycin, codeine, Demerol, morphine, penicillin, and sulfa.   SOCIAL HISTORY: Negative for alcohol or tobacco abuse.   FAMILY HISTORY: Noncontributory.  REVIEW OF SYSTEMS: As per History of Present Illness.   PHYSICAL EXAMINATION: The patient was in mild respiratory distress. Vital signs were remarkable for a blood pressure of 104/62 with a heart rate of 130 and a respiratory rate of 22. She was afebrile. HEENT exam was unremarkable. Neck was supple without JVD. Lungs revealed right basilar crackles. Cardiac exam revealed an irregularly irregular rhythm with rapid rate. Abdomen was soft and nontender. Extremities were without edema. Neurologic exam was grossly nonfocal.   HOSPITAL COURSE: The patient was admitted with persistent right lower lobe pneumonia and rapid atrial  fibrillation. She was placed on IV fluids and continued on beta blocker therapy and monitored on telemetry. Her atrial fibrillation remained stable in the hospital. She was seen in consultation by pulmonology for her persistent right lower lobe pneumonia. Bronchoscopy was performed and revealed inflammation with mucous plugging. No obvious mass was noted. Her lungs were lavaged during bronchoscopy. She was maintained on IV antibiotics. She was weaned off oxygen to room air. Her white count normalized and she remained afebrile. By 04/08/2012, the patient was stable and ready for discharge to skilled nursing. She remained weak and required physical therapy in the hospital.   DISCHARGE DIAGNOSES:  1. Persistent right lower lobe pneumonia with hemoptysis.  2. Chronic atrial fibrillation.  3. Atherosclerotic cardiovascular disease.  4. History of pulmonary embolism.  5. Chronic anemia.  6. Hyperlipidemia.  7. Osteoarthritis.  8. Status post pacemaker implant.   DISCHARGE MEDICATIONS:  1. DuoNeb SVNs four times daily. 2. Avelox 400 mg p.o. daily x10 days.  3. Doxycycline 100 mg p.o. twice a day x10 days.  4. Protonix 40 mg p.o. twice a day.  5. Lopressor 100 mg p.o. twice a day. 6. Lanoxin 0.125 mg p.o. daily.  7. Aspirin 81 mg p.o. daily.  8. Mucinex 1200 mg p.o. twice a day. 9. Arthrotec one p.o. twice a day. 10. Duke's mouthwash 10 mL p.o. three times daily. 11. MiraLax 17 grams p.o. daily as needed for constipation.  12. Ultram 50 mg p.o. every 6 hours as needed for pain.  FOLLOW-UP PLANS AND APPOINTMENTS: The patient will be followed by the resident physician at the skilled nursing facility. She is a NO CODE BLUE, DO NOT RESUSCITATE. She will be on a soft diet.  She will be seen in consultation by physical therapy.  ____________________________ [name], MD jds:slb D: 04/07/2012 07:48:00 ET T: 04/07/2012 08:21:48 ET JOB#: [id number]  cc: [name], MD, <Dictator> [name]  [name] MD ELECTRONICALLY SIGNED 04/07/2012 8:57

## 2014-12-24 NOTE — H&P (Signed)
PATIENT NAME:  [name], [name] MR#:  [id number] DATE OF BIRTH:  [birth year]  DATE OF ADMISSION:  06/09/2012  PRIMARY CARE PHYSICIAN: Dr. Fulton Reek REFERRING PHYSICIAN: Dr. Marjean Donna   PULMONOLOGIST: Dr. Raul Del  CARDIOLOGIST: Dr. Nehemiah Massed   CHIEF COMPLAINT: Altered mental status and also cough and hemoptysis.   HISTORY OF PRESENT ILLNESS: [name] is an 81 year old Caucasian female who had been treated for several months for pneumonia in the right lower lobe area. She had several CAT scans, the most recent was a few days ago. The family brought patient to the hospital for evaluation of two issues; one of them earlier last evening she fell on her right side but there was no apparent serious injuries. Later after a few hours the daughter describes mental status change in that the patient was confused and talking about a pill that she is unable to pick up. She was unable to talk or make a conversation. She was only signaling with her fingers. This issue lasted a few hours and then subsided. By the time she came to the Emergency Room she was back to her normal state. There was no focal weakness, however, at the time of the mental status change the family were unable to get her out of bed to move her around. She was stiff and did not cooperate. Additionally, the family reports that she has been treated for pneumonia at the right lower lobe, however, there was no significant improvement and over the last two weeks she is having hemoptysis in addition to her cough and sputum production. Evaluation here in the Emergency Department reveals leukocytosis with white count reaching 19,000. Her recent CBC in September 18 showed a white count of 9000. The patient is now being admitted to the hospital for evaluation of her mental status change which appears to be a transient ischemic attack and also further evaluation of her right lower lobe pneumonia and hemoptysis.    REVIEW OF SYSTEMS: CONSTITUTIONAL: Denies  fever. No chills. No fatigue. EYES: No blurring of vision. No double vision. ENT: No hearing impairment. No sore throat. No dysphagia. CARDIOVASCULAR: No chest pain. No shortness of breath. No syncope other than the fall which was not syncope but she fell. RESPIRATORY: Reports cough, sputum production for several months, in fact since July, however, over the last two weeks she is having hemoptysis as well. No chest pain. GASTROINTESTINAL: No abdominal pain. No vomiting. No diarrhea. GENITOURINARY: No dysuria. No frequency of urination. MUSCULOSKELETAL: No joint pain or swelling. No muscular pain or swelling. INTEGUMENTARY: No skin rash. No ulcers. NEUROLOGY: No focal weakness. No seizure activity. No headache but she had temporary inability to communicate or speak and she was confused, however, this had subsided. PSYCHIATRY: No anxiety. No depression. ENDOCRINE: No polyuria or polydipsia. No heat or cold intolerance.   PAST MEDICAL HISTORY:  1. Patient had been worked up for right lower lobe consolidation. She had several chest x-rays and CAT scans. In fact, the bloody sputum I see it was complaint since July of this year. The patient also followed up by Dr. Raul Del and he is planning to do bronchoscopy this coming Tuesday. Recent CAT scan showed findings consistent with postobstructive pneumonitis and loss of volume.  2. Systemic hypertension.  3. Coronary artery disease. 4. Atrial fibrillation.  5. History of anemia. 6. Gastroesophageal reflux disease with history of esophageal dilatation in the past.  7. Hyperlipidemia. 8. Osteoarthritis. 9. History of pulmonary embolism. 10. History of diverticulosis.  11. History  of esophageal ulcer.   PAST SURGICAL HISTORY:  1. Esophageal dilatation. 2. Pacemaker placement.   SOCIAL HISTORY: She is widowed, lives at home, cared by her daughters and she has also a helper who stays with her during the day.   SOCIAL HABITS: Nonsmoker. No history of alcohol or  drug abuse.   FAMILY HISTORY: Her mother suffered from coronary artery disease and heart attacks. She has a brother who had lung cancer.   ADMISSION MEDICATIONS:  1. Aspirin 81 mg a day. 2. Diclofenac with misoprostol 50/200, 1 tablet twice a day. 3. Digoxin 0.125 mg once a day.  4. Tessalon 100 mg twice a day. 5. Mucinex 1 tablet twice a day. 6. Levothyroxine 50 mcg once a day. 7. Metoprolol 100 mg twice a day. 8. Nasonex spray. 9. Nitroglycerin sublingual 0.4 mg p.r.n.  10. Nystatin taking 10 mL 4 times a day. 11. Omeprazole 20 mg twice a day.  12. MiraLax 17 grams once a day p.r.n. for constipation.  13. Potassium chloride 10 mEq once a day. 14. Torsemide or Demadex 10 mg a day. 15. Toradol 50 mg 3 to 4 times a day p.r.n. for pain.   ALLERGIES: Penicillin causing skin rash. Morphine causing nausea and vomiting. Clindamycin has GI problems. Sulfa and codeine was reported, Demerol as well and erythromycin, Darvocet. Type of reaction is unknown.   PHYSICAL EXAMINATION:  VITAL SIGNS: Blood pressure 137/72, respiratory rate 18, pulse 89, oxygen saturation 94%.   GENERAL APPEARANCE: Elderly thin lady laying in bed in no acute distress.   HEAD AND NECK: No pallor. No icterus. No cyanosis.   ENT: Hearing was normal. Nasal mucosa, lips, tongue were normal.   EYES: Normal eyelids and conjunctiva. The right pupil is about 4 to 5 mm, the left was 3 to 4 mm, reactive to light.   NECK: Supple. Trachea at midline. No thyromegaly. No cervical lymphadenopathy. No masses.   HEART: Normal S1, S2. No S3, S4. No murmur. No gallop. No carotid bruits.   RESPIRATORY: Normal breathing pattern without use of accessory muscles. No rales. No wheezing. There is decreased breathing sounds at the right lower lung fields.   ABDOMEN: Soft without tenderness. No hepatosplenomegaly. No masses. No hernias.   SKIN: No ulcers. No subcutaneous nodules.   MUSCULOSKELETAL: No joint swelling. No clubbing.    NEUROLOGIC: Cranial nerves II through XII are intact. No focal motor deficit.   PSYCHIATRIC: Patient is alert, oriented to place, people and date. Mood and affect is depressed.   LABORATORY, DIAGNOSTIC, AND RADIOLOGICAL DATA: Chest x-ray showed increased consolidation and haziness at the right lower lobe. This appears more than last chest x-ray, perhaps secondary to volume loss. 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 No acute infarction noted. Age appropriate atrophy. Chronic small vessel ischemic disease. CBC showed white count 19,000. Her CBC last month was showing normal white count. Hemoglobin 10, hematocrit 31, this is lower than her baseline a month ago. MCV was normal, MCH, MCHC as well they were normal, 83, 27, 33 respectively. Platelet count 350. Glucose 109, BUN 18, creatinine 0.8, sodium 137, potassium 4.8, calcium 9.1, AST 20, ALT 13, albumin 2.3. Urinalysis was unremarkable. Total CPK 16, CK-MB fraction normal at 0.7.   ASSESSMENT:  1. Transient ischemic attack. This had resolved and she is back to her normal state. 2. Persistent cough, sputum production and hemoptysis. This appears to be chronic and she has right lower lobe consolidation, likely she has underlying endobronchial lesion.  This is under investigation and Dr. Raul Del is working on that.    OTHER MEDICAL PROBLEMS:  1. Coronary artery disease. 2. Atrial fibrillation. 3. Hypertension. 4. Pacemaker insertion.  5. Hyperlipidemia.  6. Gastroesophageal reflux disease. 7. Past history of esophageal ulcer. 8. Diverticulosis.  9. Osteoarthritis.  10. History of pulmonary embolism.  CODE STATUS: Patient is DO NOT RESUSCITATE status.   PLAN: Will admit the patient to the medical floor. Blood cultures were ordered. Patient was taking Levaquin at home and this is now changed to Zosyn and vancomycin. I will consult Dr. Raul Del to see the patient. For the transient ischemic attack we will place the patient  on neuro checks q.2 hours then less frequently. I will keep the low dose aspirin at the time being. I will not proceed for higher anticoagulation given her hemoptysis. Similarly for deep vein thrombosis prophylaxis will use TED stockings. I will continue the rest of her home medications. Patient indicates that she has a LIVING WILL and that her CODE STATUS is DO NOT RESUSCITATE. This is supported by her daughter. She also had given the power of attorney to her oldest daughter. Her name is Williams Che.   TIME SPENT: Time spent in evaluating this patient and reviewing medical records took more than 55 minutes.    ____________________________ Clovis Pu. Lenore Manner, MD amd:cms D: 06/09/2012 06:56:38 ET T: 06/09/2012 07:40:48 ET JOB#: [id number]  cc: Clovis Pu. Lenore Manner, MD, <Dictator> [name], MD Ellin Saba MD ELECTRONICALLY SIGNED 06/09/2012 22:36

## 2014-12-24 NOTE — Consult Note (Signed)
General Aspect 81 year old female with known CAD, history of PE 2007, no longer on Coumadin therapy as of April of 2011,hyperlipidemia, hypertension, valvular heart disease, status post dual chamber pacemaker for sick sinus syndrome.  Patient has to take significant doses of anti-inflammatories for arthritis in the clinical decision was made to stop Coumadin since she could take her pain medications and have a better quality-of-life.  Over the last several months, patient has been hospitalized due to pneumonia.  She has had 5 rounds of antibiotics.  She has recently started seeing Dr. Raul Del, who ordered a sputum culture and sensitivity with it showing negative, AFP, and moderate growth of normal flora.  She is pending another CT of the chest in October.  Patient has had somewhat failure to thrive over the last several weeks to months, with generalized weakness.  This a.m. she got up to use the bathroom, started coughing and coughed up some blood.  She called her daughter and she was brought to the emergency room.  She also complains of soreness throughout her chest.  She is also having some dysphasia and is pending an appointment with Dr. Vira Agar tomorrow.  Her EKG shows  paced rhythm.  There were no acute changes.  Her troponin was barely elevated at 0.06 which considering her story does not seem to be  Acute coronary syndrome.  She has coughed up a couple more small amounts of bright red blood since she has been in the ER.  Her blood pressure is 165/78.  She is in normal sinus rhythm at 67 bpm.  Pulse ox is 95%. Chest x-ray showed some improvement in the density of the right lung.  Patient admits her cough has improved over the last couple of weeks. CBC was remarkable only for hemoglobin 11.3.   Physical Exam:   GEN well nourished, no acute distress    HEENT pink conjunctivae, hearing intact to voice    RESP normal resp effort  few crackles rt lung base    CARD Regular rate and rhythm  Normal, S1,  S2  No murmur    ABD denies tenderness    EXTR negative edema, support hose on    SKIN skin turgor decreased    NEURO cranial nerves intact, motor/sensory function intact    PSYCH alert, A+O to time, place, person, good insight   Review of Systems:   Subjective/Chief Complaint Hemoptysis    General: Weight loss or gain    Skin: No Complaints    ENT: Dysphagia  pending GI eval    Eyes: No Complaints    Neck: Lumps  feels a lump in her throat when she tries to swallow    Respiratory: Frequent cough  Hemopytsis    Cardiovascular: chest soreness    Gastrointestinal: No Complaints    Genitourinary: No Complaints    Vascular: No Complaints    Musculoskeletal: Muscle or Joint Stiffness  generalized arthritis    Neurologic: No Complaints    Hematologic: hemoptysis    Endocrine: No Complaints    Psychiatric: No Complaints    ROS Pt not able to provide ROS   Lab Results: Routine Chem:  18-Sep-13 08:21    Glucose, Serum 92   BUN 10   Creatinine (comp) 0.60   Sodium, Serum  134   Potassium, Serum 3.8   Chloride, Serum 100   CO2, Serum 28   Calcium (Total), Serum 10.0   Anion Gap  6   Osmolality (calc) 267   eGFR (African  American) >60   eGFR (Non-African American) >60 (eGFR values <11m/min/1.73 m2 may be an indication of chronic kidney disease (CKD). Calculated eGFR is useful in patients with stable renal function. The eGFR calculation will not be reliable in acutely ill patients when serum creatinine is changing rapidly. It is not useful in  patients on dialysis. The eGFR calculation may not be applicable to patients at the low and high extremes of body sizes, pregnant women, and vegetarians.)   Result Comment TROPONIN - RESULTS VERIFIED BY REPEAT TESTING.  - [place]ED TO DR. KINNER [place] [address] - BY GA ON 05/24/12  - READ-BACK PROCESS PERFORMED.  Result(s) reported on 24 May 2012 at 10:37AM.  Cardiac:  18-Sep-13 08:21    Troponin I  0.06 (0.00-0.05 0.05  ng/mL or less: NEGATIVE  Repeat testing in 3-6 hrs  if clinically indicated. >0.05 ng/mL: POTENTIAL  MYOCARDIAL INJURY. Repeat  testing in 3-6 hrs if  clinically indicated. NOTE: An increase or decrease  of 30% or more on serial  testing suggests a  clinically important change)  Routine Hem:  18-Sep-13 08:21    WBC (CBC) 9.8   RBC (CBC) 4.15   Hemoglobin (CBC)  11.3   Hematocrit (CBC) 35.2   Platelet Count (CBC) 313 (Result(s) reported on 24 May 2012 at 09:34AM.)   MCV 85   MCH 27.3   MCHC 32.1   RDW  15.5    Morphine: N/V, Other  Penicillin: Unknown  Sulfa: Unknown  Codeine: Unknown  Demerol: Unknown  Erythromycin: Unknown  Darvocet - N: Unknown  Clindamycin: GI Distress  Adhesive: Unknown    Impression 81year old female with paroxysmal atrial fibrillation, status post pacemaker, CAD, previous DVT, valvular heart disease, generalized arthritis, with several weeks of persistent pneumonia,  presenting to the  ER today with hemoptysis, chest soreness.  Minimal elevation of troponin.  The symptoms thought to be secondary to pulmonary status and not acute coronary syndrome    Plan 1. Dr. KNehemiah Massedwas also present in the ER for history and exam.  He did speak to the ER physician and recommended that patient could be discharged from the ER to followup with Dr. FRaul Delfor her persistent pneumonia and with Dr. EVira Agarfor dysphasia symptoms. 2.  Patient has an appointment with Dr. FRaul Delat 9 AM tomorrow and will see Dr. EVira Agaras already scheduled at 10:30 tomorrow.   Patient was seen collaboration with Dr. KNehemiah Massed   Electronic Signatures: CRoderic Palau(NP)  (Signed 18-Sep-13 12:48)  Authored: General Aspect/Present Illness, History and Physical Exam, Review of System, Labs, Allergies, Impression/Plan   Last Updated: 18-Sep-13 12:48 by CRoderic Palau(NP)

## 2014-12-24 NOTE — Consult Note (Signed)
PATIENT NAME:  LATONJA, BOBECK MR#:  [id number] DATE OF BIRTH:  04/03/1923  DATE OF CONSULTATION:  04/06/2012  REFERRING PHYSICIAN:   CONSULTING PHYSICIAN:  Sammuel Hines. Richardson Landry, MD  ADDENDUM:  Would also note that this patient probably needs full evaluation by speech and swallowing rehab given her right lower lobe pneumonia and swallowing difficulties. A modified barium swallow might also be considered in that evaluation. Obviously she has had persistent right lower lobe pneumonia with her dysphagia. The possibility of subclinical aspiration cannot be ruled out.  ____________________________ Sammuel Hines. Richardson Landry, MD psb:drc D: 04/06/2012 17:40:12 ET T: 04/06/2012 17:50:08 ET JOB#: [id number]  cc: Sammuel Hines. Richardson Landry, MD, <Dictator> Riley Nearing MD ELECTRONICALLY SIGNED 04/11/2012 11:28

## 2014-12-24 NOTE — Consult Note (Signed)
PATIENT NAME:  [name], MCCLARY MR#:  [id number] DATE OF BIRTH:  09-27-1922  DATE OF CONSULTATION:  06/13/2012  REFERRING PHYSICIAN:  Fulton Reek, MD   CONSULTING PHYSICIAN:  Heinz Knuckles. Louis Ivery, MD  REASON FOR CONSULTATION: Possible postobstructive pneumonia with MRSA.   HISTORY OF PRESENT ILLNESS: The patient is an 81 year old white female with a past history significant for chronic right lower lobe infiltrate who was admitted on 10/04 with altered mental status and hemoptysis. The patient had been worked up for her hemoptysis for the last several months. She has undergone several CT scans which have shown a right lower lobe infiltrate. She has been treated with multiple rounds of antibiotics. She underwent bronchoscopy in July of 2013 which did not show any evidence of malignancy and which cultures were unrevealing other than Candida albicans. She had been discharged again on antibiotics to rehab and was finally back home and was admitted on 10/04 after having a fall with subsequent confusion and problems with speaking. These symptoms lasted for several hours and then resolved. She was admitted through the Emergency Room with a possibility of a transient ischemic attack. She has continued to have hemoptysis, and a chest x-ray has shown persistent infiltrates. Her white count on admission was 19.5. She has been treated initially with Zosyn. A sputum culture has grown Stenotrophomonas and methicillin-resistant Staphylococcus aureus, and vancomycin has been added to her regimen. Her white count has  subsequently come down. Her family states she is coughing up less blood. She has been afebrile during her hospitalization.   ALLERGIES: Clindamycin, codeine, Darvocet, Demerol, erythromycin, morphine, penicillin, sulfa, and tape.   PAST MEDICAL/SURGICAL HISTORY:  1. Chronic right lower lobe infiltrate with possible postobstructive component.  2. Hypertension.  3. Coronary artery disease.  4. Atrial  fibrillation.  5. Reflux.  6. Esophageal strictures, status post dilatation.  7. Hypercholesterolemia.  8. Osteoarthritis.  9. Pulmonary embolism.  10. Diverticulosis.  11. Esophageal ulcer.  12. Status post pacemaker placement.    SOCIAL HISTORY: The patient lives by herself. She has overnight and daytime aides that come and stay with her. She does not smoke. She does not drink. No injecting drug use history. She has no pets at home.   FAMILY HISTORY: Positive for coronary artery disease, lung cancer, colon cancer, and breast cancer.    REVIEW OF SYSTEMS: GENERAL: No fevers, chills, or sweats. HEENT: Some headaches occasionally that have been intermittent and long-standing. No sinus congestion. Occasional sore throat with some trouble swallowing, but this has improved. NECK: No stiffness. No swollen glands. RESPIRATORY: Positive cough with hemoptysis and shortness of breath. CARDIAC: No chest pains or palpitations. No peripheral edema. GI: No nausea, no vomiting, no abdominal pain, no change in her bowels. GENITOURINARY: No change in her urine. MUSCULOSKELETAL: No focal joint inflammation. No focal muscle pain. SKIN: No rashes. NEUROLOGICAL: No focal weakness. PSYCHIATRIC: No complaints. All other systems are negative. Her mentation is back to her baseline per her family.   PHYSICAL EXAMINATION:  VITAL SIGNS: T-max 99.7, T-current 97.9, pulse 75, blood pressure 109/67, 90% on room air.   GENERAL: An 81 year old thin white female in no acute distress.   HEENT: Normocephalic, atraumatic. Pupils are equal and reactive to light. Extraocular motion intact. Sclerae, conjunctivae, and lids are without evidence for emboli or petechiae. Oropharynx shows no erythema or exudate. Gums are in fair condition.   NECK: Supple. Full range of motion. Midline trachea. No lymphadenopathy. No thyromegaly.   LUNGS: Clear to auscultation bilaterally  with good air movement. She is mildly tachypneic.   CARDIAC:  Regular rate and rhythm without murmur, rub, or gallop.   ABDOMEN: Soft, nontender, and nondistended. No hepatosplenomegaly. No hernia is noted.   EXTREMITIES: No evidence for tenosynovitis.   SKIN: No rashes. No stigmata of endocarditis, specifically no Janeway lesions nor Osler nodes.   NEUROLOGIC: The patient was awake and interactive. At times she was mildly confused but overall appeared to be mentating well.   LABORATORY, DIAGNOSTIC AND RADIOLOGICAL DATA: BUN 10, creatinine 0.84, bicarbonate 29, anion gap 8.0. White count of 9.0 with a hemoglobin of 9.8, platelet count 354.0. No differential was obtained today. White count on admission was 19.5 with an ANC of 17.0. Blood cultures from admission show no growth. A sputum culture is growing Stenotrophomonas and MRSA. A urinalysis from admission was unremarkable. A chest x-ray showed stable severe cardiomegaly and mild atelectasis versus infiltrate in the right base from October 7th. These results were reported normal compared to admission.   IMPRESSION: An 81 year old white female with a history of chronic right lower lobe pneumonia admitted with possible transient ischemic attack, and persistent hemoptysis, and right lower lobe pneumonia.   RECOMMENDATIONS:  1. Her prior CT scan shows possible postobstructive pneumonia, but a definitive mass or malignancy has not been found. She has undergone bronchoscopy once without a diagnosis. Her recent cultures are growing MRSA and Stenotrophomonas.  2. With a continued possibility of postobstructive process, may be difficult to treat. We will need to cover the MRSA and possibly anaerobes given the length of time she has been ill. I am not particularly concerned with the Stenotrophomonas.  3. I will give another day or so of IV therapy and then change to a p.o. regimen.  4. She will likely need several weeks of antibiotics.  5. I discussed with Pulmonology. Dr. Raul Del will consider repeating bronchoscopy  once she is stronger.   This has been a high-level Infectious Disease consult. Thank you very much for involving me in [name]'s care. ____________________________ Heinz Knuckles. Velmer Woelfel, MD meb:cbb D: 06/13/2012 16:09:02 ET T: 06/13/2012 16:37:04 ET JOB#: [id number]  cc: Heinz Knuckles. Azrielle Springsteen, MD, <Dictator> Hiawatha Merriott E Dilyn Osoria MD ELECTRONICALLY SIGNED 06/14/2012 8:30

## 2014-12-24 NOTE — Op Note (Signed)
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   PROCEDURE: Flexible fiber-optic nasal laryngoscopy.   SURGEON: Malon Kindle, MD  ANESTHESIA: Topical lidocaine.   DESCRIPTION OF PROCEDURE: After discussing the procedure with the patient, the nose was anesthetized on the right with topical lidocaine nasal drops. A flexible fiber-optic scope was passed through the right nasal cavity. No purulence or polyps were seen. The nasopharynx was clear. The scope was passed in the region of the larynx and the vocal cords inspected. There was a small fresh hemorrhage of the right vocal cord without obstruction. Vocal cords were otherwise clear and mobile. There was a granuloma on the right arytenoid. It does not look terribly inflamed, smooth mucosally and covered partially. There was some pachyderma of the posterior glottis typical of laryngopharyngeal reflux. Otherwise the supraglottic larynx, hypopharynx, and tongue base are all clear. There were no other lesions seen. The patient tolerated the procedure well.  ____________________________ Sammuel Hines. Richardson Landry, MD psb:slb D: 04/06/2012 17:38:57 ET T: 04/07/2012 10:02:22 ET JOB#: [id number]  cc: Sammuel Hines. Richardson Landry, MD, <Dictator> Riley Nearing MD ELECTRONICALLY SIGNED 04/11/2012 11:28

## 2014-12-24 NOTE — Consult Note (Signed)
PATIENT NAME:  [name], [name] MR#:  [id number] DATE OF BIRTH:  Jan 22, 1923  DATE OF CONSULTATION:  04/05/2012  REFERRING PHYSICIAN:   CONSULTING PHYSICIAN:  Allyne Gee, MD  REASON FOR CONSULTATION: Persistent pneumonia.  HISTORY OF PRESENT ILLNESS: The patient is an 81 year old female with past medical history significant for atrial fibrillation and coronary artery disease. The patient apparently came in in June of 2013 and had diagnosis of pneumonia made. At that time she was treated and discharged. She now comes back with weakness, shortness of breath, and also some blood in the sputum. The patient at the initial admission had been treated with Levaquin and, as already noted above, there does not seem to be an improvement. Current CT scan has been done and shows possible endobronchial involvement. The patient has a cough, as mentioned, with streaks of blood. No pain in the chest. Denies any syncope. No fevers are noted.   PAST MEDICAL HISTORY: 1. Hypertension. 2. Coronary artery disease. 3. Gastroesophageal reflux disease. 4. Anemia. 5. Osteoarthritis. 6. Pulmonary embolism.  PAST SURGICAL HISTORY: 1. Pacemaker placement. 2. Esophageal dilatation.  SOCIAL HISTORY: Nonsmoker, lifetime nonsmoker, but has had significant secondhand smoke exposure.   FAMILY HISTORY: Noncontributory.  REVIEW OF SYSTEMS: Full 12 point review of systems was performed and was unremarkable other than what is noted above in the HPI.  ALLERGIES: Multiple and listed on the electronic medical record.  MEDICATIONS: As listed on the electronic medical record.  PHYSICAL EXAMINATION AT THE TIME SHE WAS SEEN AND EVALUATED:  VITAL SIGNS: The patient's temperature was running 98, pulse of 92, respiratory rate 12, blood pressure 121/74, saturations are 95%.  NECK: Neck appeared to be supple. There was no JVD. No adenopathy.   CHEST: Coarse breath sounds. Few basilar crackles noted in the right side.    CARDIOVASCULAR: S1, S2 normal. No murmur, gallop, or rub.   ABDOMEN: Soft and nontender.  EXTREMITIES: Without cyanosis or clubbing. Pulses are equal.   NEUROLOGIC: She is awake and alert, moving all four extremities.   LABORATORY DATA: The CT results are as already noted. CBC shows white count 9.8, hemoglobin 7.6, BUN 17, creatinine 0.6, sodium 139, potassium 3.6. Pulse oximetry has been fine at 98 to 94%. Sputum culture was normal flora. Blood cultures showed no growth.   IMPRESSION AND RECOMMENDATIONS: Acute persistent pneumonia. My concern here obviously is whether there might be an endobronchial lesion which is causing pneumonia not to improve despite antibiotics. In looking at the current CT, the pneumonia basically looks about the same and doesn't really show that there's been any significant improvement since the last scan. I would suggest doing a bronchoscopy. I have discussed the procedure with the patient as well as her daughters and risks and benefits have been explained. They are in agreement for proceeding with the bronch. Will try to get this scheduled.   Thank you for consulting me in the care of this patient.  ____________________________ Allyne Gee, MD sak:drc D: 04/05/2012 12:38:56 ET T: 04/05/2012 13:22:17 ET JOB#: [id number] cc: Allyne Gee, MD, <Dictator> Allyne Gee MD ELECTRONICALLY SIGNED 04/12/2012 11:45

## 2014-12-27 NOTE — H&P (Signed)
PATIENT NAME:  [name], [name] MR#:  [id number] DATE OF BIRTH:  Jan 08, 1923  DATE OF ADMISSION:  09/28/2012  PRIMARY CARE PHYSICIAN: Leonie Douglas. Sparks, MD   CHIEF COMPLAINT: High-grade fever and confusion.   HISTORY OF PRESENT ILLNESS: The patient is a pleasant 81 year old Caucasian female with past medical history of recurrent pneumonia with persistent right lower lobe consolidation on chest x-ray in 2013, hypertension, chronic A. fib, chronic anemia, comes to the Emergency Room brought in by daughters with confusion and high-grade fever. The patient started having some cough symptoms this past Sunday, was seen at [facility] acute walk-in clinic yesterday and was started on Levaquin. The patient took her first dose today in the morning. Thereafter, family started noticing some confusion, shaking, and her fever was 102.6. They brought her to the Emergency Room and the patient's temperature was 99.4. She is less confused. Chest x-ray shows cardiomegaly and persistent right lower lobe infiltrate. She is being admitted for SIRS secondary to pneumonia. The patient has history of MRSA pneumonia. She received a dose of IV Levaquin and IV vancomycin.   PAST MEDICAL HISTORY:  1.  MRSA pneumonia in October 2013.  2.  History of TIA.  3.  Chronic A. fib. 4.  Hypertension.  5.  Hyperlipidemia.  6.  Sick sinus syndrome, status post pacemaker implant.  7.  History of esophageal stricture, status post dilatation.  8.  Osteoarthritis.   CODE STATUS: No code, DNR.   CURRENT MEDICATIONS:  1.  Aspirin 81 mg daily.  2.  Cyanocobalamin 1000 mcg/ mL injectable, 1 mL injectable once a month on the 20th.  3.  Lanoxin 125 mcg p.o. daily.  4.  Levofloxacin 500 mg p.o. daily, which was started yesterday, 09/27/2012.  5.  Levothyroxine 50 mcg p.o. daily.  6.  Megestrol 40 mg orally daily.  7.  Metoprolol tartrate 50 mg 1 tablet 3 times a day.  8.  Nasonex 50 mcg 2 sprays once a day.  9.  Omeprazole 20 mg b.i.d.   10.  Prednisone 20 mg 2 tablets once a day taper.  11.  ProAir HFA 90 mcg/inhalation 2 puffs 4 times daily.  12.  Tessalon Perles 100 mg 3 times a day for cough.  13.  Tramadol 50 mg t.i.d.   ALLERGIES: CLINDAMYCIN, CODEINE, DARVOCET, DEMEROL, ERYTHROMYCIN, MORPHINE, PENICILLIN, SULFA.   SOCIAL HISTORY: Lives at home. She is followed by [employer]. Nonsmoker. Nonalcoholic.   PAST SURGICAL HISTORY:  1.  Esophageal dilatation. 2.  Pacemaker placement secondary to sick sinus syndrome.   FAMILY HISTORY: Mother suffered from CAD and heart attacks. Brother has lung cancer.   REVIEW OF SYSTEMS: CONSTITUTIONAL: Positive for fever, fatigue, weakness.  EYES: No blurred or double vision. No glaucoma.  ENT: No tinnitus, ear pain, hearing loss.  RESPIRATORY: Positive for cough and shortness of breath. No hemoptysis.  CARDIOVASCULAR: No chest pain, orthopnea, edema or syncope.  GASTROINTESTINAL: No nausea, vomiting, diarrhea, abdominal pain.  GENITOURINARY: No dysuria or hematuria.  ENDOCRINE: No polyuria, nocturia or thyroid problems.  HEMATOLOGIC: No anemia or easy bruising.  SKIN: No acne or rash.  MUSCULOSKELETAL: Positive for arthritis.  NEUROLOGIC: No CVA or TIA.  PSYCHIATRIC: No anxiety or depression. All other systems reviewed and negative.   PHYSICAL EXAMINATION:  GENERAL: The patient is awake, alert, oriented x 2, not in acute distress.  VITAL SIGNS: Temperature is 99.6, pulse 93, blood pressure 121/61, sats 97% on 2 liters.  HEENT: Atraumatic, normocephalic. PERRLA. EOM intact. Oral mucosa  is dry.  NECK: Supple. No JVD. No carotid bruit.  RESPIRATORY: There are decreased breath sounds in the bases. No respiratory distress or labored breathing. I could not hear any wheezing or rhonchi or crackles.  CARDIOVASCULAR: Tachycardia present. Both the heart sounds are normal. Rhythm is regular. No murmur heard. PMI not lateralized. Chest nontender.  EXTREMITIES: Good pedal pulses, good  femoral pulses. No lower extremity edema.  ABDOMEN: Soft, benign, nontender. No organomegaly. Positive bowel sounds.  NEUROLOGIC: Grossly intact cranial nerves II through XII. No motor or sensory deficit. The patient is globally weak.  SKIN: Warm and dry.  PSYCHIATRIC: The patient is awake, alert, oriented x 2.   LABORATORY, DIAGNOSTIC AND RADIOLOGICAL DATA: Influenza A and B negative. Chest x-ray: COPD with stable cardiomegaly and right pleural effusion with some right lung base atelectasis or pneumonia. White count is 9.4, hemoglobin and hematocrit 10.1 and 30.7, platelet count 203. Glucose IS 103, BUN 21, creatinine 0.84, sodium 132; the rest of the electrolytes are normal. EKG shows electronic atrial pacemaker.   ASSESSMENT AND PLAN: The patient is an 81 year old with history of chronic atrial fibrillation, hypothyroidism, history of dysphagia and hyperlipidemia, comes in with: 1.  Systemic inflammatory response syndrome secondary to suspected early right lower lobe pneumonia: The patient presented with dry cough, shortness of breath and high-grade fever of around 102.3. The patient is hemodynamically stable. She is going to be admitted on medical floor, started on IV Levaquin and vancomycin. She has a history of methicillin-resistant Staphylococcus aureus pneumonia, which was in October 2013. Will get sputum sample if patient is able to produce. Check blood cultures. Tylenol as needed for fever.  2.  Chronic anemia: Hemoglobin and hematocrit appear to be stable at 10.1 and 30.7.  3.  History of hypertension: Continue metoprolol.  4.  Gastroesophageal reflux disease: Continue omeprazole.  5.  Hypothyroidism: Will continue levothyroxine.  6.  Deep vein thrombosis prophylaxis with subcutaneous heparin.  7.  Pulmonary consultation with Dr. Raul Del per patient and family request.  8.  Further workup of the patient's clinical course and hospital admission plan was discussed with the patient.  9.  The  patient is a no code, DNR.   TIME SPENT: 55 minutes.    ____________________________ Hart Rochester Posey Pronto, MD sap:jm D: 09/28/2012 17:32:45 ET T: 09/28/2012 17:47:09 ET JOB#: [id number]  cc: Kallum Jorgensen A. Posey Pronto, MD, <Dictator> [name], MD Ilda Basset MD ELECTRONICALLY SIGNED 09/29/2012 21:34

## 2014-12-27 NOTE — Discharge Summary (Signed)
PATIENT NAME:  [name], [name] MR#:  [id number] DATE OF BIRTH:  05-Apr-1923  DATE OF ADMISSION:  09/28/2012 DATE OF DISCHARGE:  10/02/2012  REASON FOR ADMISSION:  Fever with confusion.   HISTORY OF PRESENT ILLNESS:  Please see the dictated history of present illness done by Dr. Posey Pronto on 09/28/2012.   PAST MEDICAL HISTORY: 1.  Chronic/recurrent right lower lobe pneumonia.  2.  History of methicillin-resistant staphylococcus aureus.  3.  Chronic atrial fibrillation.  4.  Benign hypertension.  5.  Hyperlipidemia.  6.  Sick sinus syndrome, status post pacemaker implant.  7.  History of transient ischemic attacks.  8.  History of esophageal stricture, status post dilatation.  9.  Osteoarthritis.   MEDICATIONS ON ADMISSION:  Please see admission note.   ALLERGIES:  CLINDAMYCIN, CODEINE, DARVOCET, DEMEROL, ERYTHROMYCIN, MORPHINE, PENICILLIN AND SULFA.   SOCIAL HISTORY:  As per admission note.  FAMILY HISTORY:  As per admission note.  REVIEW OF SYSTEMS:  As per admission note.   PHYSICAL EXAMINATION:  GENERAL:  The patient is in no acute distress.  VITAL SIGNS:  Were remarkable for a temperature of 99.6.  HEENT:  Was unremarkable.  NECK:  Was supple without JVD.  LUNGS:  Revealed decreased breath sounds.  CARDIAC:  Revealed a regular rate and rhythm with a normal S1 and S2.  ABDOMEN:  Soft and nontender.  EXTREMITIES:  Without edema.  NEUROLOGIC:  Is grossly nonfocal.   HOSPITAL COURSE:  The patient was admitted with presumed recurrent pneumonia with chronic anemia as well as knee pain.  Follow-up PA and lateral chest x-ray revealed no evidence of pneumonia.  She was initially placed on SVNs, IV antibiotics, and oxygen.  She was quickly weaned off oxygen.  Follow-up chest x-ray again revealed no evidence of pneumonia.  Cultures were negative.  She was switched to oral antibiotics.  She was seen in consultation by orthopedics in the hospital for her knee pain.  An injection was given by  Dr. Rudene Christians with improvement of her symptoms.  She was seen by physical therapy and was ambulating with a walker.  Placement was recommended, but both the patient and family deferred placement.  They wanted to go home with hospice.  Hospice was consulted and outpatient hospice services were arranged.  By 10/02/2012, the patient was stable and ready for discharge.   DISCHARGE DIAGNOSES: 1.  Acute bronchitis.  2.  Osteoarthritis with right knee pain.  3.  Chronic atrial fibrillation.  4.  Failure to thrive.  5.  Anxiety/depression.   DISCHARGE MEDICATIONS: 1.  B12 1000 mcg IM q. month.  2.  Synthroid 50 mcg by mouth daily.  3.  Omeprazole 20 mg by mouth twice a day.  4.  Megace 400 mg by mouth daily.  5.  Metoprolol 50 mg by mouth 3 times daily.  6.  Tramadol 50 mg by mouth 3 times daily as needed for pain.  7.  Lanoxin 0.125 mg by mouth daily.  8.  Aspirin 81 mg by mouth daily. 9.  ProAir 2 puffs 4 times daily.  10.  Nasonex 2 puffs in each nostril at bedtime.  11.  Levaquin 500 mg by mouth daily x 5 days.  12.  Ambien 5 mg by mouth at bedtime as needed sleep.  13.  Potassium chloride 20 mEq by mouth daily.   FOLLOW-UP PLANS AND APPOINTMENTS:  The patient was discharged on a low sodium diet.  She will be followed by hospice.  She will follow-up with  me in 1 to 2 weeks.  Sooner if needed.     ____________________________ Leonie Douglas Doy Hutching, MD jds:ea D: 10/13/2012 14:37:23 ET T: 10/14/2012 04:11:20 ET JOB#: [id number]  cc: [name], MD, <Dictator> Juliya Magill [name] MD ELECTRONICALLY SIGNED 10/15/2012 14:49

## 2014-12-27 NOTE — Consult Note (Signed)
PATIENT NAME:  [name], [name] MR#:  [id number] DATE OF BIRTH:  31-Jan-1923  DATE OF CONSULTATION:    REFERRING PHYSICIAN:  Dr. Doy Hutching.  CONSULTING PHYSICIAN:  Allyne Gee, MD  REASON FOR CONSULTATION:  Pneumonia.   HISTORY OF PRESENT ILLNESS:  This is an 81 year old female who has past medical history significant for pneumonia and left lower lobe consolidation.  She had presented to the Emergency Room with confusion and she was also noticed to have high fevers.  She was seen apparently as an outpatient in [facility], started on antibiotics in the form of Levaquin.  The patient had been noted to have fevers as high as 102.  When she was seen in the Emergency Room though she did not have as high spiky fevers.  Currently the patient states that she is feeling somewhat better.  She still has some noticeable shortness of breath though.   PAST MEDICAL HISTORY:  Significant for MRSA pneumonitis in October, has a history of chronic A-Fib, TIAs, hypertension, hyperlipidemia, sick sinus syndrome, osteoarthritis, esophageal strictures.   PAST SURGICAL HISTORY:  She has had esophageal stricture dilatation and a pacemaker placement.   SOCIAL HISTORY:  Does not smoke or drink.    ALLERGIES:  ARE NOTED ON THE ELECTRONIC MEDICAL RECORD.   MEDICATIONS:  Are noted in the electronic medical record.    FAMILY HISTORY:  Significant for coronary disease.   REVIEW OF SYSTEMS:  A complete 12 point review of system was performed and is positive for:  GENERAL:  For fatigue and fever.  EYES:  Are negative for diplopia.  EARS, NOSE, THROAT:  Is negative for any epistaxis or tinnitus.  RESPIRATORY:  Positive for shortness of breath and cough.  No blood.  GASTROINTESTINAL:  Is negative for nausea, vomiting or diarrhea.  CARDIOVASCULAR:  Negative for chest pain.  SKIN:  Without any acute rashes.  Remainder of review of systems was unremarkable.   PHYSICAL EXAMINATION: GENERAL:  At the time that she was seen  she was awake, appeared to be alert.  VITAL SIGNS:  Her temperature was 97.4, pulse 75, respiratory rate 20, blood pressure 176/71, saturations on room air were 91% to 95%.  NECK:  Neck appeared to be supple.  There was no JVD.  There was no adenopathy.  CHEST:  Coarse breath sounds.  No rhonchi or rales.  Expansions were equal.  CARDIOVASCULAR:  S1, S2 is normal.  Regular rhythm.  No gallop or rub.  ABDOMEN:  Abdomen appeared to be soft and benign.  NEUROLOGIC:  She was moving all 4 extremities.  Gait was not checked.   LABORATORY DATA:  I reviewed the chest x-ray personally.  She had significant cardiomegaly, increased vascular congestion with cephalization consistent with some element of heart failure.  Other labs as mentioned, sats were good.  The patient's white count is normal at 7.8, hemoglobin 9.3.  The patient's chemistries were within normal limits.   IMPRESSION: 1.  Probable acute bronchitis with possible pneumonia.  2.  Chest x-ray suggested both interstitial edema and probably some congestive heart failure.   PLAN:  Right now patient is on antibiotics, would continue, would get a cardiology evaluation if this has not already been done.  The patient should have a follow-up repeat chest x-ray as an outpatient if she is to be discharged over the weekend.  We will continue to monitor as warranted.  The patient's prognosis overall remains guarded.        ____________________________ Rudi Rummage  Humphrey Rolls, MD sak:ea D: 09/30/2012 19:49:56 ET T: 10/01/2012 06:15:23 ET JOB#: [id number]  cc: Allyne Gee, MD, <Dictator> Allyne Gee MD ELECTRONICALLY SIGNED 10/27/2012 15:59

## 2014-12-27 NOTE — Consult Note (Signed)
Brief Consult Note: Diagnosis: right knee arthritis.   Patient was seen by consultant.   Recommend to proceed with surgery or procedure.   Orders entered.   Comments: will give corticosteroid injection today, hope it gets knee back to baseline.  Electronic Signatures: Laurene Footman (MD)  (Signed 24-Jan-14 12:34)  Authored: Brief Consult Note   Last Updated: 24-Jan-14 12:34 by Laurene Footman (MD)

## 2014-12-27 NOTE — Op Note (Signed)
PATIENT NAME:  [name], [name] MR#:  [id number] DATE OF BIRTH:  Nov 20, 1922  DATE OF PROCEDURE:  09/29/2012  PREOPERATIVE DIAGNOSIS: Right knee osteoarthritis.   POSTOPERATIVE DIAGNOSIS: Right knee osteoarthritis.  PROCEDURE: Right knee intra-articular corticosteroid injection.   SURGEON: Hessie Knows, MD    ANESTHESIA: None.   DESCRIPTION OF PROCEDURE: After informed consent was obtained, the anterior lateral knee was prepped with Betadine and site verification carried out.  A 25-gauge needle was then inserted into the knee, and 40 mg Kenalog 1 mL was then injected and a Band-Aid applied. The patient tolerated the procedure well, and there were no complications. No blood loss.   ____________________________ Laurene Footman, MD mjm:cb D: 10/02/2012 23:12:53 ET T: 10/03/2012 11:25:10 ET JOB#: [id number]  cc: Laurene Footman, MD, <Dictator> Laurene Footman MD ELECTRONICALLY SIGNED 10/03/2012 14:03

## 2014-12-27 NOTE — Consult Note (Signed)
PATIENT NAME:  [name], CORREIRA MR#:  [id number] DATE OF BIRTH:  Sep 04, 1923  DATE OF CONSULTATION:  09/29/2012  CONSULTING PHYSICIAN:  Laurene Footman, MD  REASON FOR CONSULTATION: Right knee osteoarthritis.   HISTORY: The patient is an 81 year old who had been to the acute care walk-in clinic at [facility] on January 22. She had had x-rays on 09/27/2012 that showed severe right knee osteoarthritis. She was having some medical problems and subsequently was admitted for these. Her right knee was limiting her ability to ambulate, and I was consulted for evaluation.   PHYSICAL EXAMINATION: She has mild swelling to the knee with crepitation on range of motion. There is obvious deformity secondary to spurs, medial and lateral. There is marked crepitation with range of motion. She has difficulty getting the leg in extension, flexes to 100 degrees.   IMAGING: Prior x-rays were reviewed and show severe tricompartmental osteoarthritis.   CLINICAL IMPRESSION: Right knee osteoarthritis.   PLAN: Intra-articular corticosteroid injection. Hope this will give some relief and allow her to continue with rehabilitation after treatment of medical problems. She will follow up in the office for repeat injections every 3 months as needed.      ____________________________ Laurene Footman, MD mjm:OSi D: 10/02/2012 23:14:19 ET T: 10/03/2012 08:21:45 ET JOB#: [id number]  cc: Laurene Footman, MD, <Dictator> Laurene Footman MD ELECTRONICALLY SIGNED 10/03/2012 14:03

## 2014-12-29 NOTE — H&P (Signed)
PATIENT NAME:  [name], [name] MR#:  [id number] DATE OF BIRTH:  [birth year]  DATE OF ADMISSION:  02/18/2012  PRIMARY CARE PHYSICIAN: Dr. Doy Hutching    REFERRING PHYSICIAN: Dr. Prince Rome   CHIEF COMPLAINT: Right-sided chest pain since yesterday.   HISTORY OF PRESENT ILLNESS: The patient is an 81 year old Caucasian female with a history of hypertension, coronary artery disease, gastroesophageal reflux disease, anemia, hyperlipidemia, history of PE, osteoarthritis, degenerative disk disease, and diverticulosis who presented to the ED with above chief complaint. The patient is lethargic, just given medication, could not provide any information. According to her daughters, the patient still feels right side chest pain since yesterday and she did not feel well. The chest pain is getting worse today and became constant. She could not cough due to the chest pain. In the ED the patient was found to have right lower lobe infiltrate per chest x-ray report. She was treated with Levaquin and admitted for pneumonia.   PAST MEDICAL HISTORY:  1. Hypertension.  2. Coronary artery disease.  3. Gastroesophageal reflux disease. 4. Anemia. 5. Hyperlipidemia. 6. Osteoarthritis. 7. PE.  8. History of esophageal ulcer. 9. Diverticulosis.   PAST SURGICAL HISTORY: Pacemaker placement.   SOCIAL HISTORY: No smoking, alcohol drinking, or illicit drugs.   FAMILY HISTORY: Unknown at this time.   REVIEW OF SYSTEMS: Unable to obtain due to the patient's mental status now.   ALLERGIES: Clindamycin, codeine, Darvocet, Demerol, erythromycin, morphine, penicillin, sulfa, adhesive  HOME MEDICATIONS:  1. Arthrotec 50 mg/200 mcg 1 tab p.o. b.i.d.  2. Aspirin 81 mg p.o. daily.  3. Ephraim Hamburger apply topical to affected area as needed. 4. Chondroitin/glucosamine 1 tablet b.i.d.  5. Citracal Plus D 1 tablet b.i.d.  6. Lasix 40 mg 0.5 tablets every other day.   7. Lopressor 25 mg 2 tablets b.i.d.  8. MiraLAX one dose as needed at  bedtime.  9. Nasonex 50 mcg nasal spray two sprays once daily p.r.n.  10. Nitrostat 0.4 mg 1 tablet sublingual every five minutes p.r.n.  11. Omeprazole 20 mg p.o. b.i.d.  12. Saline Mist one spray nasal p.r.n.  13. Tramadol 50 mg 1 tablet p.o. p.r.n.  14. Tylenol 500 mg tablets 2 tablets every four hours p.r.n.  15. Vitamin B12 1000 mcg injectable solution 1 mL injectable once a month.   PHYSICAL EXAMINATION:   VITALS: Temperature 97, blood pressure 147/63, pulse 69, oxygen saturation 91% on room air.   GENERAL: The patient is lethargic and confused but in no acute distress.   HEENT: Pupils round, equal, reactive to light. Mild dry oral mucosa. Clear oropharynx.   NECK: Supple. No JVD or carotid bruits. No lymphadenopathy. No thyromegaly.   CARDIOVASCULAR: S1, S2 regular rate and rhythm. No murmurs or gallops.   LUNGS: Bilateral air entry. No wheezing or rales. No use of accessory muscles to breathe.   ABDOMEN: Soft. No distention or tenderness. No organomegaly. Bowel sounds present.   EXTREMITIES: No edema, clubbing, or cyanosis. Bilateral strong pedal pulses.   SKIN: No rash or jaundice.   NEUROLOGY: The patient is lethargic, confused, unable to examine at this time.   LABORATORY, DIAGNOSTIC, AND RADIOLOGICAL DATA: WBC 17.3, hemoglobin 11.4, platelet 213. Lipase 32. Glucose 119, BUN 18, creatinine 0.69. Electrolytes normal. Troponin 0.03. Lactic acid 2 elastic 1.2.   Chest x-ray interstitial infiltrate, atelectasis versus asymmetric edema versus nonedematous infiltrate right lower lobe.   EKG showed demand pacemaker, sinus rhythm with short PR with premature supraventricular complexes and premature ventricular complexes,  fusion complexes.   IMPRESSION:  1. Right lower lobe pneumonia.  2. Leukocytosis.  3. History of coronary artery disease. 4. Gastroesophageal reflux disease.  5. Hypertension. 6. Hyperlipidemia. 7. Anemia.   PLAN OF TREATMENT:  1. The patient will be  admitted to medical floor.  2. Will continue telemonitor. 3. O2 by nasal cannula.  4. Continue Levaquin 500 mg IV daily.  5. Follow-up blood culture, sputum culture, CBC.  6. Continue aspirin, Lopressor, and Lasix.  7. GI and DVT prophylaxis.   Discussed the patient's situation and the plan of treatment with the patient's daughter.       TIME SPENT: About 60 minutes.   ____________________________ Demetrios Loll, MD qc:drc D: 02/18/2012 13:06:06 ET T: 02/18/2012 13:37:33 ET JOB#: [id number]  cc: Demetrios Loll, MD, <Dictator> [name], MD Demetrios Loll MD ELECTRONICALLY SIGNED 02/20/2012 14:41

## 2014-12-29 NOTE — Discharge Summary (Signed)
PATIENT NAME:  [name], [name] MR#:  [id number] DATE OF BIRTH:  06/21/1923  DATE OF ADMISSION:  02/18/2012 DATE OF DISCHARGE:  02/23/2012  REASON FOR ADMISSION: Right-sided chest pain.   HISTORY OF PRESENT ILLNESS: Please see the dictated History and Physical done by Dr. Bridgett Larsson on 02/18/2012.   PAST MEDICAL HISTORY:  1. Benign hypertension.  2. Atherosclerotic cardiovascular disease.  3. Sick sinus syndrome, status post pacemaker implant.  4. History of atrial fibrillation.  5. Gastroesophageal reflux disease.  6. Chronic anemia.  7. Hyperlipidemia.  8. Osteoarthritis.  9. History of pulmonary embolism.  10. Diverticulosis.  11. History of esophageal ulcer.   MEDICATIONS ON ADMISSION: Please see admission note.   ALLERGIES: Clindamycin, codeine, Darvocet, Demerol, erythromycin, and morphine.   SOCIAL HISTORY, FAMILY HISTORY, AND REVIEW OF SYSTEMS: As per admission note.   PHYSICAL EXAMINATION: GENERAL: The patient was lethargic and confused but in no acute distress. VITAL SIGNS: Vital signs were stable and she was afebrile. HEENT: Exam was unremarkable. NECK: Supple without JVD. LUNGS: Lungs revealed scattered rhonchi. CARDIAC: Exam revealed a regular rate and rhythm with normal S1 and S2. ABDOMEN: Soft and nontender. EXTREMITIES: Without edema. NEUROLOGIC: Exam is grossly nonfocal other than the patient's mental status.  RADIOLOGICAL DATA: Chest x-ray revealed right lower lobe infiltrate consistent with pneumonia.   HOSPITAL COURSE: The patient was admitted with right lower lobe pneumonia with lethargy and confusion. She was placed on oxygen with IV antibiotics. IV steroids and Xopenex and Atrovent SVNs were added to her regimen. The patient improved clinically. Her white count normalized. Her chest x-ray showed improvement. Her oxygenation improved, and she was taken off oxygen. She was subsequently switched to p.o. antibiotics and continued to do well. She was seen in consultation by  Physical Therapy, who recommended Home Health. This was arranged. By 02/23/2012, the patient was stable and ready for discharge.   DISCHARGE DIAGNOSES:  1. Right lower lobe pneumonia.  2. Chronic anemia.  3. Generalized weakness.  4. Encephalopathy, resolved.  5. Chronic atrial fibrillation.  6. Sick sinus syndrome, status post pacemaker implant.  7. Benign hypertension.  8. Hyperlipidemia.   DISCHARGE MEDICATIONS:  1. Arthrotec one p.o. b.i.d.  2. Omeprazole 20 mg p.o. b.i.d.  3. Aspirin 81 mg p.o. daily.  4. Lasix 20 mg p.o. every other day.  5. Lopressor 50 mg p.o. every 8 hours.  6. Nasonex 2 puffs in each nostril daily.  7. Nitrostat p.r.n. chest pain.  8. Ultram 50 mg p.o. every 6 hours p.r.n.  9. MiraLax 17 grams p.o. at bedtime p.r.n.  10. B12 1000 mcg IM monthly.  11. Mucinex 600 mg p.o. b.i.d.   12. Colace 100 mg p.o. b.i.d.  13. Prednisone taper as directed.  14. Combivent Respimat 1 puff q.i.d.  15. Levaquin 250 mg p.o. daily x10 days.  16. Norco 5/325, 1 p.o. every 4 hours p.r.n. pain.   FOLLOW-UP PLANS AND APPOINTMENTS:  1. The patient was discharged on a low sodium diet.  2. She will be followed by Home Health.  3. She will follow up with me in 1 to 2 weeks, sooner if needed.  ____________________________ Leonie Douglas Doy Hutching, MD jds:cbb D: 02/25/2012 10:03:40 ET T: 02/25/2012 15:02:16 ET JOB#: [id number]  cc: [name], MD, <Dictator> Kylinn Shropshire [name] MD ELECTRONICALLY SIGNED 02/25/2012 21:06

## 2015-03-19 NOTE — Op Note (Signed)
PATIENT NAME:  [name], [name] MR#:  [id number] DATE OF BIRTH:  Feb 11, 1923  DATE OF PROCEDURE:  06/13/2012  PREOPERATIVE DIAGNOSIS:  Pneumonia.  POSTOPERATIVE DIAGNOSIS:  Pneumonia.  PROCEDURES:  1. Ultrasound guidance for vascular access to the right brachial vein.  2. Fluoroscopic guidance for placement of catheter.  3. Insertion of Morpheus double-lumen PICC line, right arm.  SURGEON:  Hortencia Pilar, MD  ANESTHESIA: Local.   ESTIMATED BLOOD LOSS: Minimal.   INDICATION FOR PROCEDURE:   Requiring antibiotics for greater than five days.  DESCRIPTION OF PROCEDURE: The patient's  right arm was sterilely prepped and draped, and a sterile surgical field was created. The brachial vein was accessed under direct ultrasound guidance without difficulty with a micropuncture needle and permanent image was recorded. 0.018 wire was then placed into the superior vena cava. Peel-away sheath was placed over the wire. A single lumen peripherally inserted central venous catheter was then placed over the wire and the wire and peel-away sheath were removed. The catheter tip was placed into the superior vena cava and was secured at the skin at 36 cm with a sterile dressing. The catheter withdrew blood well and flushed easily with heparinized saline. The patient tolerated procedure well.  ____________________________ Katha Cabal, MD ggs:bjt D: 06/13/2012 17:04:11 ET T: 06/14/2012 07:37:01 ET JOB#: [id number]  cc: Katha Cabal, MD, <Dictator> Katha Cabal MD ELECTRONICALLY SIGNED 06/21/2012 12:34

## 2015-09-08 ENCOUNTER — Emergency Department: Payer: Medicare Other

## 2015-09-08 ENCOUNTER — Observation Stay
Admission: EM | Admit: 2015-09-08 | Discharge: 2015-09-09 | Disposition: A | Discharge status: Home / Self Care | Payer: Medicare Other | Attending: Internal Medicine | Admitting: Internal Medicine

## 2015-09-08 ENCOUNTER — Other Ambulatory Visit: Payer: Self-pay

## 2015-09-08 ENCOUNTER — Encounter: Payer: Self-pay | Admitting: Emergency Medicine

## 2015-09-08 DIAGNOSIS — Z86718 Personal history of other venous thrombosis and embolism: Secondary | ICD-10-CM | POA: Insufficient documentation

## 2015-09-08 DIAGNOSIS — I4892 Unspecified atrial flutter: Secondary | ICD-10-CM | POA: Insufficient documentation

## 2015-09-08 DIAGNOSIS — R05 Cough: Secondary | ICD-10-CM | POA: Insufficient documentation

## 2015-09-08 DIAGNOSIS — R0981 Nasal congestion: Secondary | ICD-10-CM | POA: Insufficient documentation

## 2015-09-08 DIAGNOSIS — K449 Diaphragmatic hernia without obstruction or gangrene: Secondary | ICD-10-CM | POA: Insufficient documentation

## 2015-09-08 DIAGNOSIS — B349 Viral infection, unspecified: Secondary | ICD-10-CM | POA: Insufficient documentation

## 2015-09-08 DIAGNOSIS — R531 Weakness: Secondary | ICD-10-CM | POA: Insufficient documentation

## 2015-09-08 DIAGNOSIS — R51 Headache: Secondary | ICD-10-CM | POA: Diagnosis not present

## 2015-09-08 DIAGNOSIS — Z882 Allergy status to sulfonamides status: Secondary | ICD-10-CM | POA: Diagnosis not present

## 2015-09-08 DIAGNOSIS — R0781 Pleurodynia: Principal | ICD-10-CM | POA: Insufficient documentation

## 2015-09-08 DIAGNOSIS — Z881 Allergy status to other antibiotic agents status: Secondary | ICD-10-CM | POA: Diagnosis not present

## 2015-09-08 DIAGNOSIS — I252 Old myocardial infarction: Secondary | ICD-10-CM | POA: Diagnosis not present

## 2015-09-08 DIAGNOSIS — Z885 Allergy status to narcotic agent status: Secondary | ICD-10-CM | POA: Insufficient documentation

## 2015-09-08 DIAGNOSIS — Z880 Allergy status to penicillin: Secondary | ICD-10-CM | POA: Insufficient documentation

## 2015-09-08 DIAGNOSIS — Z950 Presence of cardiac pacemaker: Secondary | ICD-10-CM | POA: Insufficient documentation

## 2015-09-08 DIAGNOSIS — R0602 Shortness of breath: Secondary | ICD-10-CM | POA: Insufficient documentation

## 2015-09-08 DIAGNOSIS — R918 Other nonspecific abnormal finding of lung field: Secondary | ICD-10-CM | POA: Diagnosis not present

## 2015-09-08 DIAGNOSIS — R079 Chest pain, unspecified: Secondary | ICD-10-CM

## 2015-09-08 DIAGNOSIS — I482 Chronic atrial fibrillation: Secondary | ICD-10-CM | POA: Diagnosis not present

## 2015-09-08 DIAGNOSIS — Z91048 Other nonmedicinal substance allergy status: Secondary | ICD-10-CM | POA: Insufficient documentation

## 2015-09-08 DIAGNOSIS — I440 Atrioventricular block, first degree: Secondary | ICD-10-CM | POA: Insufficient documentation

## 2015-09-08 DIAGNOSIS — Z8673 Personal history of transient ischemic attack (TIA), and cerebral infarction without residual deficits: Secondary | ICD-10-CM | POA: Insufficient documentation

## 2015-09-08 HISTORY — DX: Acute embolism and thrombosis of unspecified vein: I82.90

## 2015-09-08 HISTORY — DX: Cerebral infarction, unspecified: I63.9

## 2015-09-08 HISTORY — DX: Acute myocardial infarction, unspecified: I21.9

## 2015-09-08 HISTORY — DX: Presence of cardiac pacemaker: Z95.0

## 2015-09-08 LAB — CBC
HCT: 33.5 % — ABNORMAL LOW (ref 35.0–47.0)
HEMATOCRIT: 36.8 % (ref 35.0–47.0)
HEMOGLOBIN: 11 g/dL — AB (ref 12.0–16.0)
Hemoglobin: 12.2 g/dL (ref 12.0–16.0)
MCH: 28.1 pg (ref 26.0–34.0)
MCH: 27.8 pg (ref 26.0–34.0)
MCHC: 33.1 g/dL (ref 32.0–36.0)
MCHC: 32.7 g/dL (ref 32.0–36.0)
MCV: 85.1 fL (ref 80.0–100.0)
MCV: 85.1 fL (ref 80.0–100.0)
Platelets: 189 10*3/uL (ref 150–440)
Platelets: 159 10*3/uL (ref 150–440)
RBC: 4.33 MIL/uL (ref 3.80–5.20)
RBC: 3.94 MIL/uL (ref 3.80–5.20)
RDW: 14.6 % — AB (ref 11.5–14.5)
RDW: 14.7 % — AB (ref 11.5–14.5)
WBC: 10.5 10*3/uL (ref 3.6–11.0)
WBC: 9.1 10*3/uL (ref 3.6–11.0)

## 2015-09-08 LAB — URINALYSIS COMPLETE WITH MICROSCOPIC (ARMC ONLY)
BILIRUBIN URINE: NEGATIVE
Bacteria, UA: NONE SEEN
GLUCOSE, UA: NEGATIVE mg/dL
KETONES UR: NEGATIVE mg/dL
Leukocytes, UA: NEGATIVE
NITRITE: NEGATIVE
Protein, ur: NEGATIVE mg/dL
SPECIFIC GRAVITY, URINE: 1.008 (ref 1.005–1.030)
Squamous Epithelial / LPF: NONE SEEN
WBC UA: NONE SEEN WBC/hpf (ref 0–5)
pH: 6 (ref 5.0–8.0)

## 2015-09-08 LAB — BASIC METABOLIC PANEL
ANION GAP: 7 (ref 5–15)
BUN: 20 mg/dL (ref 6–20)
CO2: 30 mmol/L (ref 22–32)
Calcium: 9.4 mg/dL (ref 8.9–10.3)
Chloride: 99 mmol/L — ABNORMAL LOW (ref 101–111)
Creatinine, Ser: 1.13 mg/dL — ABNORMAL HIGH (ref 0.44–1.00)
GFR calc Af Amer: 48 mL/min — ABNORMAL LOW (ref >60)
GFR, EST NON AFRICAN AMERICAN: 41 mL/min — AB (ref >60)
Glucose, Bld: 87 mg/dL (ref 65–99)
POTASSIUM: 4.2 mmol/L (ref 3.5–5.1)
SODIUM: 136 mmol/L (ref 135–145)

## 2015-09-08 LAB — CREATININE, SERUM
CREATININE: 1.06 mg/dL — AB (ref 0.44–1.00)
GFR, EST AFRICAN AMERICAN: 52 mL/min — AB (ref >60)
GFR, EST NON AFRICAN AMERICAN: 45 mL/min — AB (ref >60)

## 2015-09-08 LAB — TROPONIN I
Troponin I: 0.03 ng/mL (ref <0.031)
Troponin I: 0.03 ng/mL (ref <0.031)

## 2015-09-08 MED ORDER — DIPHENHYDRAMINE-APAP (SLEEP) 25-500 MG PO TABS: 1.0000 | ORAL_TABLET | Freq: Every evening | ORAL | Status: DC | PRN

## 2015-09-08 MED ORDER — VITAMIN B-12 1000 MCG PO TABS
1000.0000 ug | ORAL_TABLET | Freq: Every day | ORAL | Status: DC
  Administered 2015-09-09: 1000 ug via ORAL
  Filled 2015-09-08: qty 1

## 2015-09-08 MED ORDER — TRAMADOL HCL 50 MG PO TABS
50.0000 mg | ORAL_TABLET | Freq: Four times a day (QID) | ORAL | Status: DC | PRN
  Administered 2015-09-09: 50 mg via ORAL
  Filled 2015-09-08: qty 1

## 2015-09-08 MED ORDER — FLUTICASONE PROPIONATE 50 MCG/ACT NA SUSP
1.0000 | Freq: Every day | NASAL | Status: DC
  Administered 2015-09-09: 1 via NASAL
  Filled 2015-09-08: qty 16

## 2015-09-08 MED ORDER — ACETAMINOPHEN 325 MG PO TABS
650.0000 mg | ORAL_TABLET | ORAL | Status: AC
  Administered 2015-09-08: 650 mg via ORAL
  Filled 2015-09-08: qty 2

## 2015-09-08 MED ORDER — PREDNISONE 5 MG PO TABS
5.0000 mg | ORAL_TABLET | Freq: Every day | ORAL | Status: DC
  Administered 2015-09-09: 5 mg via ORAL
  Filled 2015-09-08: qty 1

## 2015-09-08 MED ORDER — ACETAMINOPHEN 325 MG PO TABS
650.0000 mg | ORAL_TABLET | Freq: Four times a day (QID) | ORAL | Status: DC | PRN
  Administered 2015-09-09 (×2): 650 mg via ORAL
  Filled 2015-09-08 (×2): qty 2

## 2015-09-08 MED ORDER — SENNA 8.6 MG PO TABS: 1.0000 | ORAL_TABLET | Freq: Two times a day (BID) | ORAL | Status: DC | PRN

## 2015-09-08 MED ORDER — ZOLPIDEM TARTRATE 5 MG PO TABS: 5.0000 mg | ORAL_TABLET | Freq: Every evening | ORAL | Status: DC | PRN

## 2015-09-08 MED ORDER — IOHEXOL 350 MG/ML SOLN
50.0000 mL | Freq: Once | INTRAVENOUS | Status: AC | PRN
  Administered 2015-09-08: 50 mL via INTRAVENOUS

## 2015-09-08 MED ORDER — ACETAMINOPHEN 650 MG RE SUPP: 650.0000 mg | Freq: Four times a day (QID) | RECTAL | Status: DC | PRN

## 2015-09-08 MED ORDER — ENOXAPARIN SODIUM 30 MG/0.3ML ~~LOC~~ SOLN
30.0000 mg | SUBCUTANEOUS | Status: DC
  Administered 2015-09-08: 30 mg via SUBCUTANEOUS
  Filled 2015-09-08: qty 0.3

## 2015-09-08 MED ORDER — ONDANSETRON HCL 4 MG/2ML IJ SOLN
4.0000 mg | Freq: Four times a day (QID) | INTRAMUSCULAR | Status: DC | PRN
  Administered 2015-09-08: 4 mg via INTRAVENOUS
  Filled 2015-09-08: qty 2

## 2015-09-08 MED ORDER — UMECLIDINIUM-VILANTEROL 62.5-25 MCG/INH IN AEPB: 1.0000 | INHALATION_SPRAY | Freq: Every day | RESPIRATORY_TRACT | Status: DC

## 2015-09-08 MED ORDER — SODIUM CHLORIDE 0.9 % IV BOLUS (SEPSIS)
1000.0000 mL | Freq: Once | INTRAVENOUS | Status: AC
  Administered 2015-09-08: 1000 mL via INTRAVENOUS

## 2015-09-08 MED ORDER — FUROSEMIDE 40 MG PO TABS
40.0000 mg | ORAL_TABLET | Freq: Every day | ORAL | Status: DC
  Administered 2015-09-09: 40 mg via ORAL
  Filled 2015-09-08: qty 1

## 2015-09-08 MED ORDER — ONDANSETRON HCL 4 MG PO TABS: 4.0000 mg | ORAL_TABLET | Freq: Three times a day (TID) | ORAL | Status: DC | PRN

## 2015-09-08 MED ORDER — POTASSIUM CHLORIDE CRYS ER 10 MEQ PO TBCR
10.0000 meq | EXTENDED_RELEASE_TABLET | Freq: Every day | ORAL | Status: DC
Start: 2015-09-08 — End: 2015-09-09
  Administered 2015-09-08 – 2015-09-09 (×2): 10 meq via ORAL
  Filled 2015-09-08 (×2): qty 1

## 2015-09-08 MED ORDER — SENNOSIDES-DOCUSATE SODIUM 8.6-50 MG PO TABS: 1.0000 | ORAL_TABLET | Freq: Every evening | ORAL | Status: DC | PRN

## 2015-09-08 MED ORDER — ONDANSETRON HCL 4 MG PO TABS: 4.0000 mg | ORAL_TABLET | Freq: Four times a day (QID) | ORAL | Status: DC | PRN

## 2015-09-08 MED ORDER — DIGOXIN 125 MCG PO TABS
0.1250 mg | ORAL_TABLET | Freq: Every day | ORAL | Status: DC
  Filled 2015-09-08: qty 1

## 2015-09-08 MED ORDER — SALMETEROL XINAFOATE 50 MCG/DOSE IN AEPB
1.0000 | INHALATION_SPRAY | Freq: Two times a day (BID) | RESPIRATORY_TRACT | Status: DC
  Administered 2015-09-08 – 2015-09-09 (×2): 1 via RESPIRATORY_TRACT
  Filled 2015-09-08: qty 0

## 2015-09-08 MED ORDER — SODIUM CHLORIDE 0.9 % IV SOLN
Freq: Once | INTRAVENOUS | Status: AC
  Administered 2015-09-08: 18:00:00 via INTRAVENOUS

## 2015-09-08 MED ORDER — ASPIRIN EC 81 MG PO TBEC
81.0000 mg | DELAYED_RELEASE_TABLET | Freq: Every day | ORAL | Status: DC
  Administered 2015-09-09: 81 mg via ORAL
  Filled 2015-09-08: qty 1

## 2015-09-08 MED ORDER — POLYETHYLENE GLYCOL 3350 17 G PO PACK
17.0000 g | PACK | Freq: Every day | ORAL | Status: DC
  Administered 2015-09-08: 17 g via ORAL
  Filled 2015-09-08 (×2): qty 1

## 2015-09-08 MED ORDER — PANTOPRAZOLE SODIUM 40 MG PO TBEC
40.0000 mg | DELAYED_RELEASE_TABLET | Freq: Every day | ORAL | Status: DC
  Administered 2015-09-09: 40 mg via ORAL
  Filled 2015-09-08: qty 1

## 2015-09-08 MED ORDER — LEVOTHYROXINE SODIUM 50 MCG PO TABS
50.0000 ug | ORAL_TABLET | Freq: Every day | ORAL | Status: DC
  Administered 2015-09-09: 50 ug via ORAL
  Filled 2015-09-08: qty 1

## 2015-09-08 MED ORDER — ACETAMINOPHEN 325 MG PO TABS: 650.0000 mg | ORAL_TABLET | Freq: Four times a day (QID) | ORAL | Status: DC | PRN

## 2015-09-08 MED ORDER — ASPIRIN 81 MG PO CHEW
324.0000 mg | CHEWABLE_TABLET | Freq: Once | ORAL | Status: AC
  Administered 2015-09-08: 324 mg via ORAL
  Filled 2015-09-08: qty 4

## 2015-09-08 MED ORDER — METOPROLOL TARTRATE 25 MG PO TABS
25.0000 mg | ORAL_TABLET | Freq: Two times a day (BID) | ORAL | Status: DC
  Administered 2015-09-09: 25 mg via ORAL
  Filled 2015-09-08: qty 1

## 2015-09-08 MED ORDER — TIOTROPIUM BROMIDE MONOHYDRATE 18 MCG IN CAPS
18.0000 ug | ORAL_CAPSULE | Freq: Every day | RESPIRATORY_TRACT | Status: DC
  Administered 2015-09-09: 18 ug via RESPIRATORY_TRACT
  Filled 2015-09-08: qty 5

## 2015-09-08 NOTE — ED Notes (Signed)
Patient transported to CT 

## 2015-09-08 NOTE — ED Provider Notes (Signed)
[facility] Emergency Department Provider Note REMINDER - THIS NOTE IS NOT A FINAL MEDICAL RECORD UNTIL IT IS SIGNED. UNTIL THEN, THE CONTENT BELOW MAY REFLECT INFORMATION FROM A DOCUMENTATION TEMPLATE, NOT THE ACTUAL PATIENT VISIT. ____________________________________________  Time seen: Approximately 1:01 PM  I have reviewed the triage vital signs and the nursing notes.   HISTORY  Chief Complaint Chest Pain and Shortness of Breath    HPI [name] is a 82 y.o. female history of previous coronary disease, heart attack, stroke, and pacemaker. History of atrial fibrillation, currently on digoxin.  Patient presents for the evaluation of chest pain. She's been having some mild shortness of breath, sharp and somewhat heavy pain in the middle chest throughout yesterday, and then today about 2 hours prior to arrival started to experience pain radiating into the left side of the chest and arm. It is worsened with deep breathing. She poorly feels very tired today, also slight short of breath. Denies abdominal pain nausea or vomiting. She had a small breakfast this morning.  Past Medical History  Diagnosis Date  . Myocardial infarction ([place])   . Stroke ([place])   . Pacemaker   . Blood clot in vein     There are no active problems to display for this patient.   Past Surgical History  Procedure Laterality Date  . Appendectomy    . Tonsillectomy    . Breast surgery    . Eye surgery      Current Outpatient Rx  Name  Route  Sig  Dispense  Refill  . acetaminophen (TYLENOL) 325 MG tablet   Oral   Take 650-1,000 mg by mouth every 6 (six) hours as needed.         Marland Kitchen aspirin EC 81 MG tablet   Oral   Take 81 mg by mouth daily.         . digoxin (LANOXIN) 0.125 MG tablet   Oral   Take 0.125 mg by mouth daily.         . diphenhydramine-acetaminophen (TYLENOL PM) 25-500 MG TABS tablet   Oral   Take 1 tablet by mouth at bedtime as needed.         .  furosemide (LASIX) 40 MG tablet   Oral   Take 40 mg by mouth.         . haloperidol (HALDOL) 0.5 MG tablet   Oral   Take 0.5 mg by mouth every 4 (four) hours as needed for agitation.         Marland Kitchen levothyroxine (SYNTHROID, LEVOTHROID) 50 MCG tablet   Oral   Take 50 mcg by mouth daily before breakfast. 30 minutes before breakfast         . metoprolol tartrate (LOPRESSOR) 25 MG tablet   Oral   Take 25 mg by mouth 2 (two) times daily.         . mometasone (NASONEX) 50 MCG/ACT nasal spray   Nasal   Place 2 sprays into the nose daily.         . nitroGLYCERIN (NITROSTAT) 0.4 MG SL tablet   Sublingual   Place 0.4 mg under the tongue every 5 (five) minutes as needed for chest pain.         Marland Kitchen omeprazole (PRILOSEC) 20 MG capsule   Oral   Take 20 mg by mouth 2 (two) times daily before a meal. 30 minutes before meals         . ondansetron (ZOFRAN) 8 MG  tablet   Oral   Take by mouth every 8 (eight) hours as needed for nausea or vomiting.         . polyethylene glycol (MIRALAX / GLYCOLAX) packet   Oral   Take 17 g by mouth daily.         . potassium chloride (K-DUR,KLOR-CON) 10 MEQ tablet   Oral   Take 10 mEq by mouth daily.         . predniSONE (DELTASONE) 5 MG tablet   Oral   Take 5 mg by mouth daily with breakfast.         . senna (SENOKOT) 8.6 MG TABS tablet   Oral   Take 1 tablet by mouth 2 (two) times daily as needed for mild constipation.          . traMADol (ULTRAM) 50 MG tablet   Oral   Take by mouth 4 (four) times daily as needed for moderate pain or severe pain.         Marland Kitchen Umeclidinium-Vilanterol (ANORO ELLIPTA) 62.5-25 MCG/INH AEPB   Inhalation   Inhale 1 puff into the lungs daily.         . vitamin B-12 (CYANOCOBALAMIN) 1000 MCG tablet   Oral   Take 1,000 mcg by mouth daily.           Allergies Adhesive; Clindamycin/lincomycin; Codeine; Demerol; Erythromycin; Morphine and related; Penicillins; and Sulfa antibiotics  History  reviewed. No pertinent family history.  Social History Social History  Substance Use Topics  . Smoking status: Never Smoker   . Smokeless tobacco: None  . Alcohol Use: No    Review of Systems Constitutional: No fever/chills, she has felt a little warm over the last couple days Eyes: No visual changes. ENT: No sore throat. Cardiovascular: See history of present illness Respiratory: Mild shortness of breath, pain worsens with deep breathing, and also reports no significant cough. Gastrointestinal: No abdominal pain.  No nausea, no vomiting.  No diarrhea.  No constipation. Genitourinary: Negative for dysuria. Musculoskeletal: Negative for back pain. Skin: Negative for rash. Neurological: Negative for headaches, focal weakness or numbness.  Reports previous pulmonary embolism  10-point ROS otherwise negative.  ____________________________________________   PHYSICAL EXAM:  VITAL SIGNS: ED Triage Vitals  Enc Vitals Group     BP 09/08/15 1222 124/44 mmHg     Pulse Rate 09/08/15 1222 96     Resp 09/08/15 1222 16     Temp 09/08/15 1222 100.1 F (37.8 C)     Temp src --      SpO2 09/08/15 1222 96 %     Weight 09/08/15 1222 120 lb (54.432 kg)     Height 09/08/15 1222 5\' 3"  (1.6 m)     Head Cir --      Peak Flow --      Pain Score --      Pain Loc --      Pain Edu? --      Excl. in [place]? --    Constitutional: Alert and oriented. Well appearing and in no acute distress. Eyes: Conjunctivae are normal. PERRL. EOMI. Head: Atraumatic. Nose: No congestion/rhinnorhea. Mouth/Throat: Mucous membranes are dry.  Oropharynx non-erythematous. Neck: No stridor.   Cardiovascular: Normal rate, regular rhythm. Grossly normal heart sounds.  Good peripheral circulation. Respiratory: Normal respiratory effort.  No retractions. Lungs CTAB. Patient does report increased pain with deep inspiration. Gastrointestinal: Soft and nontender. No distention. Negative Murphy. Musculoskeletal: No lower  extremity tenderness nor edema.  No joint  effusions. Neurologic:  Normal speech and language. No gross focal neurologic deficits are appreciated. Skin:  Skin is warm, dry and intact. No rash noted. Psychiatric: Mood and affect are normal. Speech and behavior are normal.  ____________________________________________   LABS (all labs ordered are listed, but only abnormal results are displayed)  Labs Reviewed  BASIC METABOLIC PANEL - Abnormal; Notable for the following:    Chloride 99 (*)    Creatinine, Ser 1.13 (*)    GFR calc non Af Amer 41 (*)    GFR calc Af Amer 48 (*)    All other components within normal limits  CBC - Abnormal; Notable for the following:    RDW 14.6 (*)    All other components within normal limits  URINALYSIS COMPLETEWITH MICROSCOPIC ([facility] ONLY) - Abnormal; Notable for the following:    Color, Urine STRAW (*)    APPearance CLEAR (*)    Hgb urine dipstick 1+ (*)    All other components within normal limits  TROPONIN I  CBG MONITORING, ED   ____________________________________________  EKG  Reviewed and interpreted by me at 1225 Ventricle rate 60 Atrial pacing, nonspecific T wave abnormality seen in inferior leads with minimal T-wave inversion. No significant ST elevation. Question minimal T-wave abnormality seen in lateral precordial leads, barely notable. QTc 380 QRS 80 PR 300 ____________________________________________  RADIOLOGY  DG Chest 2 View (Final result) Result time: 09/08/15 13:06:35   Final result by Rad Results In Interface (09/08/15 13:06:35)   Narrative:   CLINICAL DATA: Chest pain and shortness of breath and weakness.  EXAM: CHEST 2 VIEW  COMPARISON: Report of chest x-ray dated 07/13/2013 and chest x-rays dated 06/12/2012 and 05/24/2012  FINDINGS: There is chronic marked cardiomegaly, decreased since the prior chest x-rays. Tortuosity and calcification of the thoracic aorta. Pulmonary vascularity is normal. The lungs are  clear. No effusions. No acute osseous abnormality. Pacemaker in place.  IMPRESSION: Chronic marked cardiomegaly, slightly improved. No acute abnormalities.   Electronically Signed By: Lorriane Shire M.D. On: 09/08/2015 13:06       ____________________________________________   PROCEDURES  Procedure(s) performed: None  Critical Care performed: No  ____________________________________________   INITIAL IMPRESSION / ASSESSMENT AND PLAN / ED COURSE  Pertinent labs & imaging results that were available during my care of the patient were reviewed by me and considered in my medical decision making (see chart for details).  Patient plan for the evaluation of chest pain. In the ER she is noted to have a low-grade temperature. Does appear to have a mild pleuritic component. Given her previous history differential would include pulmonary embolus him, pleurisy, viral syndrome, pneumonia, pericarditis, myocarditis, or other concern. No intra-abdominal symptoms. Based on her previous history, chest x-ray does not reveal clear infiltrate we will obtain CT imaging to rule out pulmonary wasn't.  Discussed with the patient and based on her age, radiating pain, and a component of chest pressure we will obtain CT imaging and I plan to bring her in the hospital for rule out if CT negative given moderate risk. Patient family very agreeable.  After receiving Tylenol aspirin the patient does report her pain has gone away except when she does take a deep breath is still slightly there.  ----------------------------------------- 3:59 PM on 09/08/2015 -----------------------------------------  Ongoing care assigned to Dr. Marcelene Butte. Follow-up CT angiography, plan to admit the patient to hospital there after for further evaluation. The case was discussed with Dr. Ubaldo Glassing of cardiology given the patient's previous history as well as her  complaint of chest pain radiating into the left arm today. This  fairly atypical for the patient, and we will continue to rule her out for ACS given her moderate risk factors and previous coronary disease. She has not had any provocative stress testing recently, cardiology results do not show any clear recent stress testing. ____________________________________________   FINAL CLINICAL IMPRESSION(S) / ED DIAGNOSES  Final diagnoses:  Pleuritic pain  Moderate risk chest pain      Delman Kitten, MD 09/08/15 1601

## 2015-09-08 NOTE — Progress Notes (Signed)
Pt. admitted to unit, rm260 from ED, report from [place], [place]. Oriented to room, call bell, Ascom phones and staff. Bed in low position. Fall safety plan reviewed, contract signed and placed on wall, yellow non-skid socks in place, bed alarm on. Daughters at bedside. Full assessment to Epic; skin assessed with Carlyle Dolly, RN. Telemetry box verified with tele clerk and Angelia Mould, NT: [phone] . Will continue to monitor.

## 2015-09-08 NOTE — ED Notes (Addendum)
Pt states she is having chest pain, weakness, and shortness of breath that Sunday night. Pt states " it kinda catches" locate din central chest. Pt's pacemaker checked last week and was told everything was fine. Pt is alert and oriented x3.

## 2015-09-08 NOTE — H&P (Signed)
[facility] [place] NAME: [name]    MR#:  [id number]  DATE OF BIRTH:  01-24-1923  DATE OF ADMISSION:  09/08/2015  PRIMARY CARE PHYSICIAN:. Dr sparks  REQUESTING/REFERRING PHYSICIAN: Dr Jacqualine Code  CHIEF COMPLAINT:  Low-grade fever, pleuritic chest pain, generalized body aches, sinus congestion  HISTORY OF PRESENT ILLNESS:  [name]  is a 82 y.o. female with a known history of history of MI, chronic A. fib not on any anticoagulation, history of pacemaker comes to the emergency room after she started feeling increasing weakness along with generalized body aches a dull headache and low-grade fever of 100.4. Patient noticed some pleuritic chest pain with substernal today. She did cough up some mucus early earlier today. Denies any shortness of breath. She overall feels weak. Her EKG does not show any acute changes. First set of troponin is negative. Patient is being admitted for possible viral syndrome rule out MI.  PAST MEDICAL HISTORY:   Past Medical History  Diagnosis Date  . Myocardial infarction ([place])   . Stroke ([place])   . Pacemaker   . Blood clot in vein     PAST SURGICAL HISTOIRY:   Past Surgical History  Procedure Laterality Date  . Appendectomy    . Tonsillectomy    . Breast surgery    . Eye surgery      SOCIAL HISTORY:   Social History  Substance Use Topics  . Smoking status: Never Smoker   . Smokeless tobacco: Not on file  . Alcohol Use: No    FAMILY HISTORY:  History reviewed. No pertinent family history.  DRUG ALLERGIES:   Allergies  Allergen Reactions  . Adhesive [Tape] Itching    Reaction: itching  . Clindamycin/Lincomycin Other (See Comments)    Reaction: upset stomach   . Codeine Other (See Comments)  . Demerol [Meperidine] Nausea Only  . Erythromycin Other (See Comments)  . Morphine And Related     Reaction: unknown   . Penicillins Rash    Has patient had a PCN reaction causing  immediate rash, facial/tongue/throat swelling, SOB or lightheadedness with hypotension: unknown Has patient had a PCN reaction causing severe rash involving mucus membranes or skin necrosis: unknown Has patient had a PCN reaction that required hospitalization: unknown  Has patient had a PCN reaction occurring within the last 10 years: unknown  If all of the above answers are "NO", then may proceed with Cephalosporin use.    . Sulfa Antibiotics Rash    REVIEW OF SYSTEMS:  Review of Systems  Constitutional: Positive for fever. Negative for chills and weight loss.  HENT: Positive for congestion. Negative for ear discharge, ear pain and nosebleeds.   Eyes: Negative for blurred vision, pain and discharge.  Respiratory: Negative for sputum production, shortness of breath, wheezing and stridor.   Cardiovascular: Positive for chest pain. Negative for palpitations, orthopnea and PND.       Pleuritic chest pain  Gastrointestinal: Negative for nausea, vomiting, abdominal pain and diarrhea.  Genitourinary: Negative for urgency and frequency.  Musculoskeletal: Positive for myalgias and joint pain. Negative for back pain.       Generalized body aches  Neurological: Positive for headaches. Negative for sensory change, speech change, focal weakness and weakness.  Psychiatric/Behavioral: Negative for depression and hallucinations. The patient is not nervous/anxious.   All other systems reviewed and are negative.    MEDICATIONS AT HOME:   Prior to Admission medications   Medication Sig Start Date  End Date Taking? Authorizing Provider  acetaminophen (TYLENOL) 325 MG tablet Take 650-1,000 mg by mouth every 6 (six) hours as needed.   Yes Historical Provider, MD  aspirin EC 81 MG tablet Take 81 mg by mouth daily.   Yes Historical Provider, MD  digoxin (LANOXIN) 0.125 MG tablet Take 0.125 mg by mouth daily.   Yes Historical Provider, MD  diphenhydramine-acetaminophen (TYLENOL PM) 25-500 MG TABS tablet  Take 1 tablet by mouth at bedtime as needed.   Yes Historical Provider, MD  furosemide (LASIX) 40 MG tablet Take 40 mg by mouth.   Yes Historical Provider, MD  haloperidol (HALDOL) 0.5 MG tablet Take 0.5 mg by mouth every 4 (four) hours as needed for agitation.   Yes Historical Provider, MD  levothyroxine (SYNTHROID, LEVOTHROID) 50 MCG tablet Take 50 mcg by mouth daily before breakfast. 30 minutes before breakfast   Yes Historical Provider, MD  metoprolol tartrate (LOPRESSOR) 25 MG tablet Take 25 mg by mouth 2 (two) times daily.   Yes Historical Provider, MD  mometasone (NASONEX) 50 MCG/ACT nasal spray Place 2 sprays into the nose daily.   Yes Historical Provider, MD  nitroGLYCERIN (NITROSTAT) 0.4 MG SL tablet Place 0.4 mg under the tongue every 5 (five) minutes as needed for chest pain.   Yes Historical Provider, MD  omeprazole (PRILOSEC) 20 MG capsule Take 20 mg by mouth 2 (two) times daily before a meal. 30 minutes before meals   Yes Historical Provider, MD  ondansetron (ZOFRAN) 8 MG tablet Take by mouth every 8 (eight) hours as needed for nausea or vomiting.   Yes Historical Provider, MD  polyethylene glycol (MIRALAX / GLYCOLAX) packet Take 17 g by mouth daily.   Yes Historical Provider, MD  potassium chloride (K-DUR,KLOR-CON) 10 MEQ tablet Take 10 mEq by mouth daily.   Yes Historical Provider, MD  predniSONE (DELTASONE) 5 MG tablet Take 5 mg by mouth daily with breakfast.   Yes Historical Provider, MD  senna (SENOKOT) 8.6 MG TABS tablet Take 1 tablet by mouth 2 (two) times daily as needed for mild constipation.    Yes Historical Provider, MD  traMADol (ULTRAM) 50 MG tablet Take by mouth 4 (four) times daily as needed for moderate pain or severe pain.   Yes Historical Provider, MD  Umeclidinium-Vilanterol (ANORO ELLIPTA) 62.5-25 MCG/INH AEPB Inhale 1 puff into the lungs daily.   Yes Historical Provider, MD  vitamin B-12 (CYANOCOBALAMIN) 1000 MCG tablet Take 1,000 mcg by mouth daily.   Yes  Historical Provider, MD      VITAL SIGNS:  Blood pressure 120/57, pulse 96, temperature 100.1 F (37.8 C), resp. rate 24, height 5\' 3"  (1.6 m), weight 54.432 kg (120 lb), SpO2 94 %.  PHYSICAL EXAMINATION:  GENERAL:  82 y.o.-year-old patient lying in the bed with no acute distress.  EYES: Pupils equal, round, reactive to light and accommodation. No scleral icterus. Extraocular muscles intact.  HEENT: Head atraumatic, normocephalic. Oropharynx and nasopharynx clear. Congestion NECK:  Supple, no jugular venous distention. No thyroid enlargement, no tenderness.  LUNGS: Normal breath sounds bilaterally, no wheezing, rales,rhonchi or crepitation. No use of accessory muscles of respiration.  CARDIOVASCULAR: S1, S2 normal. No murmurs, rubs, or gallops.  ABDOMEN: Soft, nontender, nondistended. Bowel sounds present. No organomegaly or mass.  EXTREMITIES: No pedal edema, cyanosis, or clubbing.  NEUROLOGIC: Cranial nerves II through XII are intact. Muscle strength 5/5 in all extremities. Sensation intact. Gait not checked.  PSYCHIATRIC: The patient is alert and oriented x 3.  SKIN:  No obvious rash, lesion, or ulcer.   LABORATORY PANEL:   CBC  Recent Labs Lab 09/08/15 1227  WBC 10.5  HGB 12.2  HCT 36.8  PLT 189   ------------------------------------------------------------------------------------------------------------------  Chemistries   Recent Labs Lab 09/08/15 1227  NA 136  K 4.2  CL 99*  CO2 30  GLUCOSE 87  BUN 20  CREATININE 1.13*  CALCIUM 9.4   ------------------------------------------------------------------------------------------------------------------  Cardiac Enzymes  Recent Labs Lab 09/08/15 1227  TROPONINI <0.03   ------------------------------------------------------------------------------------------------------------------  RADIOLOGY:  Dg Chest 2 View  09/08/2015  CLINICAL DATA:  Chest pain and shortness of breath and weakness. EXAM: CHEST  2 VIEW  COMPARISON:  Report of chest x-ray dated 07/13/2013 and chest x-rays dated 06/12/2012 and 05/24/2012 FINDINGS: There is chronic marked cardiomegaly, decreased since the prior chest x-rays. Tortuosity and calcification of the thoracic aorta. Pulmonary vascularity is normal. The lungs are clear. No effusions. No acute osseous abnormality. Pacemaker in place. IMPRESSION: Chronic marked cardiomegaly, slightly improved. No acute abnormalities. Electronically Signed   By: Lorriane Shire M.D.   On: 09/08/2015 13:06   Ct Angio Chest Pe W/cm &/or Wo Cm  09/08/2015  CLINICAL DATA:  Patient with chest pain, weakness shortness of breath. EXAM: CT ANGIOGRAPHY CHEST WITH CONTRAST TECHNIQUE: Multidetector CT imaging of the chest was performed using the standard protocol during bolus administration of intravenous contrast. Multiplanar CT image reconstructions and MIPs were obtained to evaluate the vascular anatomy. CONTRAST:  53mL OMNIPAQUE IOHEXOL 350 MG/ML SOLN COMPARISON:  CT chest 05/26/2012. FINDINGS: Mediastinum/Nodes: Stable small low-attenuation nodules within the left and right thyroid lobes. Marked right atrial hypertrophy. No pericardial effusion. Aorta and main pulmonary artery are normal in size. No axillary, mediastinal or hilar lymphadenopathy. Adequate opacification of the pulmonary arterial system. Limited evaluation of the left lower lobe pulmonary arteries due to motion artifact. Within the above limitation, no evidence for pulmonary embolism. Lungs/Pleura: Central airways are patent. Multiple tiny 2- 3 mm predominately ground-glass nodules are demonstrated within the right and left upper lobes, stable from prior. Subpleural bandlike and nodular opacities within the right lower lobe may represent atelectasis, infection and/or scarring. No pleural effusion or pneumothorax. Upper abdomen: Small hiatal hernia. Musculoskeletal: No aggressive or acute appearing osseous lesions. Thoracic spine degenerative changes.  Review of the MIP images confirms the above findings. IMPRESSION: No evidence for pulmonary embolism. Evaluation of the left lower lobe pulmonary arteries limited due to motion artifact. Subpleural linear and bandlike opacities as well as focal areas of consolidation within the right lower lobe which are nonspecific and may represent atelectasis, infection and/or scarring. Recommend short term radiographic followup to ensure resolution. Multiple 2-3 mm ground-glass nodules are demonstrated within the right right and left upper lobes and appear stable compared with remote prior chest CT, potentially sequelae of prior infectious process. Marked right atrial enlargement. Electronically Signed   By: Lovey Newcomer M.D.   On: 09/08/2015 16:04    EKG:  Sinus rhythm  IMPRESSION AND PLAN:   [name]  is a 82 y.o. female with a known history of history of MI, chronic A. fib not on any anticoagulation, history of pacemaker comes to the emergency room after she started feeling increasing weakness along with generalized body aches a dull headache and low-grade fever of 100.4. Patient noticed some pleuritic chest pain with substernal today.  1. Chest pain pleuritic mid substernal -Appears viral syndrome given constellation of symptoms with low-grade fever, sinus congestion, generalized body aches and myalgia, no headache -Tylenol as  needed Monitor fever curve -Cycle cardiac enzymes 3 -Cardiac consultation if needed  2. Chronic A. fib flutter/A. Fib Rate controlled Not on any anticoagulation  3. DVT prophylaxis subcutaneous Lovenox  4. Generalized weakness Physical therapy to see  Above was discussed with patient and patient's daughter present in the emergency room.   All the records are reviewed and case discussed with ED provider. Management plans discussed with the patient, family and they are in agreement.  CODE STATUS: Full  TOTAL TIME TAKING CARE OF THIS PATIENT: 50 minutes.     Calvin Chura M.D on 09/08/2015 at 4:35 PM  Between 7am to 6pm - Pager - [phone]  After 6pm go to [email or web address] - password [email or web address] [facility]  [facility]  Office  [phone]  CC: Primary care physician; No primary care provider on file.

## 2015-09-08 NOTE — Progress Notes (Signed)
ANTICOAGULATION CONSULT NOTE - Initial Consult  Pharmacy Consult for Lovenox  Indication: VTE prophylaxis  Allergies  Allergen Reactions  . Adhesive [Tape] Itching    Reaction: itching  . Clindamycin/Lincomycin Other (See Comments)    Reaction: upset stomach   . Codeine Other (See Comments)  . Demerol [Meperidine] Nausea Only  . Erythromycin Other (See Comments)  . Morphine And Related     Reaction: unknown   . Penicillins Rash    Has patient had a PCN reaction causing immediate rash, facial/tongue/throat swelling, SOB or lightheadedness with hypotension: unknown Has patient had a PCN reaction causing severe rash involving mucus membranes or skin necrosis: unknown Has patient had a PCN reaction that required hospitalization: unknown  Has patient had a PCN reaction occurring within the last 10 years: unknown  If all of the above answers are "NO", then may proceed with Cephalosporin use.    . Sulfa Antibiotics Rash    Patient Measurements: Height: 5\' 3"  (160 cm) (stated) Weight: 122 lb 11.2 oz (55.656 kg) (admission) IBW/kg (Calculated) : 52.4 Heparin Dosing Weight:   Vital Signs: Temp: 98 F (36.7 C) (01/02 1734) Temp Source: Oral (01/02 1734) BP: 104/52 mmHg (01/02 1734) Pulse Rate: 69 (01/02 1734)  Labs:  Recent Labs  09/08/15 1227 09/08/15 1734  HGB 12.2 11.0*  HCT 36.8 33.5*  PLT 189 159  CREATININE 1.13*  --   TROPONINI <0.03  --     Estimated Creatinine Clearance: 27.4 mL/min (by C-G formula based on Cr of 1.13).   Medical History: Past Medical History  Diagnosis Date  . Myocardial infarction ([place])   . Stroke ([place])   . Pacemaker   . Blood clot in vein     Medications:  Prescriptions prior to admission  Medication Sig Dispense Refill Last Dose  . acetaminophen (TYLENOL) 325 MG tablet Take 650-1,000 mg by mouth every 6 (six) hours as needed.   prn at prn  . aspirin EC 81 MG tablet Take 81 mg by mouth daily.   09/08/2015 at Unknown time  .  digoxin (LANOXIN) 0.125 MG tablet Take 0.125 mg by mouth daily.   09/08/2015 at Unknown time  . diphenhydramine-acetaminophen (TYLENOL PM) 25-500 MG TABS tablet Take 1 tablet by mouth at bedtime as needed.   prn at prn  . furosemide (LASIX) 40 MG tablet Take 40 mg by mouth.   09/08/2015 at Unknown time  . haloperidol (HALDOL) 0.5 MG tablet Take 0.5 mg by mouth every 4 (four) hours as needed for agitation.   prn at prn  . levothyroxine (SYNTHROID, LEVOTHROID) 50 MCG tablet Take 50 mcg by mouth daily before breakfast. 30 minutes before breakfast   09/08/2015 at am  . metoprolol tartrate (LOPRESSOR) 25 MG tablet Take 25 mg by mouth 2 (two) times daily.     . mometasone (NASONEX) 50 MCG/ACT nasal spray Place 2 sprays into the nose daily.   prn at prn  . nitroGLYCERIN (NITROSTAT) 0.4 MG SL tablet Place 0.4 mg under the tongue every 5 (five) minutes as needed for chest pain.   prn at prn  . omeprazole (PRILOSEC) 20 MG capsule Take 20 mg by mouth 2 (two) times daily before a meal. 30 minutes before meals   09/08/2015 at Unknown time  . ondansetron (ZOFRAN) 8 MG tablet Take by mouth every 8 (eight) hours as needed for nausea or vomiting.   prn at prn  . polyethylene glycol (MIRALAX / GLYCOLAX) packet Take 17 g by mouth daily.  prn at prn  . potassium chloride (K-DUR,KLOR-CON) 10 MEQ tablet Take 10 mEq by mouth daily.   09/07/2015 at Unknown time  . predniSONE (DELTASONE) 5 MG tablet Take 5 mg by mouth daily with breakfast.   09/08/2015 at am   . senna (SENOKOT) 8.6 MG TABS tablet Take 1 tablet by mouth 2 (two) times daily as needed for mild constipation.    09/08/2015 at am  . traMADol (ULTRAM) 50 MG tablet Take by mouth 4 (four) times daily as needed for moderate pain or severe pain.   09/08/2015 at Unknown time  . Umeclidinium-Vilanterol (ANORO ELLIPTA) 62.5-25 MCG/INH AEPB Inhale 1 puff into the lungs daily.   09/08/2015 at Unknown time  . vitamin B-12 (CYANOCOBALAMIN) 1000 MCG tablet Take 1,000 mcg by mouth daily.    09/08/2015 at am     Assessment: CrCl = 27.4 ml/min  Goal of Therapy:  DVT prophylaxis   Plan:  Lovenox 40 mg SQ Q24H originally ordered, will adjust dose to lovenox 30 mg SQ Q24H based on CrCl < 30 ml/min.   Vikrant Pryce D 09/08/2015,6:08 PM

## 2015-09-09 DIAGNOSIS — R0781 Pleurodynia: Secondary | ICD-10-CM | POA: Diagnosis not present

## 2015-09-09 LAB — TROPONIN I: TROPONIN I: 0.03 ng/mL (ref <0.031)

## 2015-09-09 NOTE — Discharge Instructions (Signed)
Activity as tolerated with the help of walker Diet-heart healthy Follow-up with primary care physician in a week Follow-up with cardiology in a week

## 2015-09-09 NOTE — Discharge Summary (Signed)
[facility] [place] NAME: [name]    MR#:  [id number]  DATE OF BIRTH:  1923-06-16  DATE OF ADMISSION:  09/08/2015 ADMITTING PHYSICIAN: Fritzi Mandes, MD  DATE OF DISCHARGE: 09/09/2015 PRIMARY CARE PHYSICIAN: No primary care provider on file.    ADMISSION DIAGNOSIS:  Pleuritic pain [R07.81] Moderate risk chest pain [R07.9]  DISCHARGE DIAGNOSIS:  Active Problems:   Chest pain, pleuritic  acute viral syndrome  SECONDARY DIAGNOSIS:   Past Medical History  Diagnosis Date  . Myocardial infarction ([place])   . Stroke ([place])   . Pacemaker   . Blood clot in vein     HOSPITAL COURSE:  Brileigh Meyers is a 82 y.o. female with a known history of history of MI, chronic A. fib not on any anticoagulation, history of pacemaker comes to the emergency room after she started feeling increasing weakness along with generalized body aches a dull headache and low-grade fever of 100.4. Patient noticed some pleuritic chest pain with substernal today.  1. Chest pain pleuritic mid substernal -Appears viral syndrome given constellation of symptoms with low-grade fever, sinus congestion, generalized body aches and myalgia, no headache- improved clinically -Tylenol as needed Monitor fever curve-afebrile -Negative cardiac enzymes 3  2. Chronic A. fib flutter/A. Fib Rate controlled Not on any anticoagulation Outpatient follow-up with cardiology as recommended  3. DVT prophylaxis subcutaneous Lovenox  4. Generalized weakness Patient and family refused Physical therapy evaluation as patient has 247 caregivers at home and ambulates with walker at home   DISCHARGE CONDITIONS:   fair  CONSULTS OBTAINED:      PROCEDURES none  DRUG ALLERGIES:   Allergies  Allergen Reactions  . Adhesive [Tape] Itching    Reaction: itching  . Clindamycin/Lincomycin Other (See Comments)    Reaction: upset stomach   . Codeine Other (See Comments)  . Demerol  [Meperidine] Nausea Only  . Erythromycin Other (See Comments)  . Morphine And Related     Reaction: unknown   . Penicillins Rash    Has patient had a PCN reaction causing immediate rash, facial/tongue/throat swelling, SOB or lightheadedness with hypotension: unknown Has patient had a PCN reaction causing severe rash involving mucus membranes or skin necrosis: unknown Has patient had a PCN reaction that required hospitalization: unknown  Has patient had a PCN reaction occurring within the last 10 years: unknown  If all of the above answers are "NO", then may proceed with Cephalosporin use.    . Sulfa Antibiotics Rash    DISCHARGE MEDICATIONS:   Current Discharge Medication List    CONTINUE these medications which have NOT CHANGED   Details  acetaminophen (TYLENOL) 325 MG tablet Take 650-1,000 mg by mouth every 6 (six) hours as needed.    aspirin EC 81 MG tablet Take 81 mg by mouth daily.    digoxin (LANOXIN) 0.125 MG tablet Take 0.125 mg by mouth daily.    diphenhydramine-acetaminophen (TYLENOL PM) 25-500 MG TABS tablet Take 1 tablet by mouth at bedtime as needed.    furosemide (LASIX) 40 MG tablet Take 40 mg by mouth.    haloperidol (HALDOL) 0.5 MG tablet Take 0.5 mg by mouth every 4 (four) hours as needed for agitation.    levothyroxine (SYNTHROID, LEVOTHROID) 50 MCG tablet Take 50 mcg by mouth daily before breakfast. 30 minutes before breakfast    metoprolol tartrate (LOPRESSOR) 25 MG tablet Take 25 mg by mouth 2 (two) times daily.    mometasone (NASONEX) 50 MCG/ACT nasal  spray Place 2 sprays into the nose daily.    nitroGLYCERIN (NITROSTAT) 0.4 MG SL tablet Place 0.4 mg under the tongue every 5 (five) minutes as needed for chest pain.    omeprazole (PRILOSEC) 20 MG capsule Take 20 mg by mouth 2 (two) times daily before a meal. 30 minutes before meals    ondansetron (ZOFRAN) 8 MG tablet Take by mouth every 8 (eight) hours as needed for nausea or vomiting.     polyethylene glycol (MIRALAX / GLYCOLAX) packet Take 17 g by mouth daily.    potassium chloride (K-DUR,KLOR-CON) 10 MEQ tablet Take 10 mEq by mouth daily.    predniSONE (DELTASONE) 5 MG tablet Take 5 mg by mouth daily with breakfast.    senna (SENOKOT) 8.6 MG TABS tablet Take 1 tablet by mouth 2 (two) times daily as needed for mild constipation.     traMADol (ULTRAM) 50 MG tablet Take by mouth 4 (four) times daily as needed for moderate pain or severe pain.    Umeclidinium-Vilanterol (ANORO ELLIPTA) 62.5-25 MCG/INH AEPB Inhale 1 puff into the lungs daily.    vitamin B-12 (CYANOCOBALAMIN) 1000 MCG tablet Take 1,000 mcg by mouth daily.         DISCHARGE INSTRUCTIONS:     DIET:  Cardiac diet  DISCHARGE CONDITION:  Fair  ACTIVITY:  Activity as tolerated with walker  OXYGEN:  Home Oxygen: No.   Oxygen Delivery: room air  DISCHARGE LOCATION:  home   If you experience worsening of your admission symptoms, develop shortness of breath, life threatening emergency, suicidal or homicidal thoughts you must seek medical attention immediately by calling 911 or calling your MD immediately  if symptoms less severe.  You Must read complete instructions/literature along with all the possible adverse reactions/side effects for all the Medicines you take and that have been prescribed to you. Take any new Medicines after you have completely understood and accpet all the possible adverse reactions/side effects.   Please note  You were cared for by a hospitalist during your hospital stay. If you have any questions about your discharge medications or the care you received while you were in the hospital after you are discharged, you can call the unit and asked to speak with the hospitalist on call if the hospitalist that took care of you is not available. Once you are discharged, your primary care physician will handle any further medical issues. Please note that NO REFILLS for any discharge  medications will be authorized once you are discharged, as it is imperative that you return to your primary care physician (or establish a relationship with a primary care physician if you do not have one) for your aftercare needs so that they can reassess your need for medications and monitor your lab values.     Today  Chief Complaint  Patient presents with  . Chest Pain  . Shortness of Breath   Patient is feeling fine. Body aches are improving. Headache is resolved. Wants to go home. According to the 2 daughters, at home patient ambulates with walker and has caregivers 24/ 7 at home.  ROS: none CONSTITUTIONAL: Denies fevers, chills. Denies any fatigue, weakness.  EYES: Denies blurry vision, double vision, eye pain. EARS, NOSE, THROAT: Denies tinnitus, ear pain, hearing loss. RESPIRATORY: Denies cough, wheeze, shortness of breath.  CARDIOVASCULAR: Denies chest pain, palpitations, edema.  GASTROINTESTINAL: Denies nausea, vomiting, diarrhea, abdominal pain. Denies bright red blood per rectum. GENITOURINARY: Denies dysuria, hematuria. ENDOCRINE: Denies nocturia or thyroid problems. HEMATOLOGIC AND  LYMPHATIC: Denies easy bruising or bleeding. SKIN: Denies rash or lesion. MUSCULOSKELETAL: Denies pain in neck, back, shoulder, knees, hips or arthritic symptoms.  NEUROLOGIC: Denies paralysis, paresthesias.  PSYCHIATRIC: Denies anxiety or depressive symptoms.   VITAL SIGNS:  Blood pressure 107/50, pulse 68, temperature 98.4 F (36.9 C), temperature source Oral, resp. rate 18, height 5\' 3"  (1.6 m), weight 55.656 kg (122 lb 11.2 oz), SpO2 97 %.  I/O:   Intake/Output Summary (Last 24 hours) at 09/09/15 1257 Last data filed at 09/09/15 1002  Gross per 24 hour  Intake 191.25 ml  Output    500 ml  Net -308.75 ml    PHYSICAL EXAMINATION:  GENERAL:  82 y.o.-year-old patient lying in the bed with no acute distress.  EYES: Pupils equal, round, reactive to light and accommodation. No  scleral icterus. Extraocular muscles intact.  HEENT: Head atraumatic, normocephalic. Oropharynx and nasopharynx clear.  NECK:  Supple, no jugular venous distention. No thyroid enlargement, no tenderness.  LUNGS: Normal breath sounds bilaterally, no wheezing, rales,rhonchi or crepitation. No use of accessory muscles of respiration.  CARDIOVASCULAR: S1, S2 normal. No murmurs, rubs, or gallops.  ABDOMEN: Soft, non-tender, non-distended. Bowel sounds present. No organomegaly or mass.  EXTREMITIES: No pedal edema, cyanosis, or clubbing.  NEUROLOGIC: Cranial nerves II through XII are intact. Muscle strength 5/5 in all extremities. Sensation intact. Gait not checked.  PSYCHIATRIC: The patient is alert and oriented x 3.  SKIN: No obvious rash, lesion, or ulcer.   DATA REVIEW:   CBC  Recent Labs Lab 09/08/15 1734  WBC 9.1  HGB 11.0*  HCT 33.5*  PLT 159    Chemistries   Recent Labs Lab 09/08/15 1227 09/08/15 1734  NA 136  --   K 4.2  --   CL 99*  --   CO2 30  --   GLUCOSE 87  --   BUN 20  --   CREATININE 1.13* 1.06*  CALCIUM 9.4  --     Cardiac Enzymes  Recent Labs Lab 09/09/15 0541  TROPONINI 0.03    Microbiology Results  Results for orders placed or performed in visit on 09/28/12  Influenza A&B Antigens [facility])     Status: None   Collection Time: 09/28/12  4:10 PM  Result Value Ref Range Status   Micro Text Report   Final       COMMENT                   NEGATIVE FOR INFLUENZA A (ANTIGEN ABSENT)   COMMENT                   NEGATIVE FOR INFLUENZA B (ANTIGEN ABSENT)   ANTIBIOTIC                                                      Culture, blood (single)     Status: None   Collection Time: 09/28/12  4:10 PM  Result Value Ref Range Status   Micro Text Report   Final       COMMENT                   NO GROWTH AEROBICALLY/ANAEROBICALLY IN 5 DAYS   ANTIBIOTIC  Culture, blood (single)     Status: None    Collection Time: 09/28/12  4:10 PM  Result Value Ref Range Status   Micro Text Report   Final       COMMENT                   NO GROWTH AEROBICALLY/ANAEROBICALLY IN 5 DAYS   ANTIBIOTIC                                                      Culture, expectorated sputum-assessment     Status: None   Collection Time: 09/29/12  6:38 AM  Result Value Ref Range Status   Micro Text Report   Final       COMMENT                   APPEARS TO BE NORMAL FLORA AT 48 HOURS   GRAM STAIN                EXCELLENT SPECIMEN-90-100% WBC   GRAM STAIN                MANY WHITE BLOOD CELLS   GRAM STAIN                FEW GRAM POSITIVE COCCI   GRAM STAIN                RARE GRAM NEGATIVE ROD   ANTIBIOTIC                                                        RADIOLOGY:  Dg Chest 2 View  09/08/2015  CLINICAL DATA:  Chest pain and shortness of breath and weakness. EXAM: CHEST  2 VIEW COMPARISON:  Report of chest x-ray dated 07/13/2013 and chest x-rays dated 06/12/2012 and 05/24/2012 FINDINGS: There is chronic marked cardiomegaly, decreased since the prior chest x-rays. Tortuosity and calcification of the thoracic aorta. Pulmonary vascularity is normal. The lungs are clear. No effusions. No acute osseous abnormality. Pacemaker in place. IMPRESSION: Chronic marked cardiomegaly, slightly improved. No acute abnormalities. Electronically Signed   By: Lorriane Shire M.D.   On: 09/08/2015 13:06   Ct Angio Chest Pe W/cm &/or Wo Cm  09/08/2015  CLINICAL DATA:  Patient with chest pain, weakness shortness of breath. EXAM: CT ANGIOGRAPHY CHEST WITH CONTRAST TECHNIQUE: Multidetector CT imaging of the chest was performed using the standard protocol during bolus administration of intravenous contrast. Multiplanar CT image reconstructions and MIPs were obtained to evaluate the vascular anatomy. CONTRAST:  6mL OMNIPAQUE IOHEXOL 350 MG/ML SOLN COMPARISON:  CT chest 05/26/2012. FINDINGS: Mediastinum/Nodes: Stable small  low-attenuation nodules within the left and right thyroid lobes. Marked right atrial hypertrophy. No pericardial effusion. Aorta and main pulmonary artery are normal in size. No axillary, mediastinal or hilar lymphadenopathy. Adequate opacification of the pulmonary arterial system. Limited evaluation of the left lower lobe pulmonary arteries due to motion artifact. Within the above limitation, no evidence for pulmonary embolism. Lungs/Pleura: Central airways are patent. Multiple tiny 2- 3 mm predominately ground-glass nodules are demonstrated within the right and left upper lobes, stable from prior. Subpleural bandlike and nodular  opacities within the right lower lobe may represent atelectasis, infection and/or scarring. No pleural effusion or pneumothorax. Upper abdomen: Small hiatal hernia. Musculoskeletal: No aggressive or acute appearing osseous lesions. Thoracic spine degenerative changes. Review of the MIP images confirms the above findings. IMPRESSION: No evidence for pulmonary embolism. Evaluation of the left lower lobe pulmonary arteries limited due to motion artifact. Subpleural linear and bandlike opacities as well as focal areas of consolidation within the right lower lobe which are nonspecific and may represent atelectasis, infection and/or scarring. Recommend short term radiographic followup to ensure resolution. Multiple 2-3 mm ground-glass nodules are demonstrated within the right right and left upper lobes and appear stable compared with remote prior chest CT, potentially sequelae of prior infectious process. Marked right atrial enlargement. Electronically Signed   By: Lovey Newcomer M.D.   On: 09/08/2015 16:04    EKG:   Orders placed or performed during the hospital encounter of 09/08/15  . ED EKG  . ED EKG      Management plans discussed with the patient, family and they are in agreement.  CODE STATUS:     Code Status Orders        Start     Ordered   09/08/15 1734  Full code    Continuous     09/08/15 1733    Advance Directive Documentation        Most Recent Value   Type of Advance Directive  Healthcare Power of Attorney, Living will   Pre-existing out of facility DNR order (yellow form or pink MOST form)     "MOST" Form in Place?        TOTAL TIME TAKING CARE OF THIS PATIENT: 45  minutes.    @[facility] @  on 09/09/2015 at 12:58 PM  Between 7am to 6pm - Pager - [phone]  After 6pm go to [email or web address] - password [email or web address] [facility]  [facility]  Office  [phone]  CC: Primary care physician; No primary care provider on file.

## 2017-04-24 ENCOUNTER — Observation Stay
Admission: EM | Admit: 2017-04-24 | Discharge: 2017-04-26 | Disposition: A | Discharge status: Home / Self Care | Payer: Medicare Other | Attending: Internal Medicine | Admitting: Internal Medicine

## 2017-04-24 ENCOUNTER — Encounter: Payer: Self-pay | Admitting: Emergency Medicine

## 2017-04-24 ENCOUNTER — Emergency Department: Payer: Medicare Other

## 2017-04-24 DIAGNOSIS — I82442 Acute embolism and thrombosis of left tibial vein: Secondary | ICD-10-CM | POA: Insufficient documentation

## 2017-04-24 DIAGNOSIS — R0602 Shortness of breath: Secondary | ICD-10-CM | POA: Insufficient documentation

## 2017-04-24 DIAGNOSIS — R0789 Other chest pain: Secondary | ICD-10-CM

## 2017-04-24 DIAGNOSIS — Z950 Presence of cardiac pacemaker: Secondary | ICD-10-CM | POA: Diagnosis not present

## 2017-04-24 DIAGNOSIS — M1990 Unspecified osteoarthritis, unspecified site: Secondary | ICD-10-CM | POA: Diagnosis not present

## 2017-04-24 DIAGNOSIS — Z880 Allergy status to penicillin: Secondary | ICD-10-CM | POA: Diagnosis not present

## 2017-04-24 DIAGNOSIS — J449 Chronic obstructive pulmonary disease, unspecified: Secondary | ICD-10-CM | POA: Insufficient documentation

## 2017-04-24 DIAGNOSIS — Z7982 Long term (current) use of aspirin: Secondary | ICD-10-CM | POA: Insufficient documentation

## 2017-04-24 DIAGNOSIS — Z791 Long term (current) use of non-steroidal anti-inflammatories (NSAID): Secondary | ICD-10-CM | POA: Insufficient documentation

## 2017-04-24 DIAGNOSIS — I82432 Acute embolism and thrombosis of left popliteal vein: Secondary | ICD-10-CM | POA: Insufficient documentation

## 2017-04-24 DIAGNOSIS — Z86711 Personal history of pulmonary embolism: Secondary | ICD-10-CM | POA: Diagnosis not present

## 2017-04-24 DIAGNOSIS — I2510 Atherosclerotic heart disease of native coronary artery without angina pectoris: Secondary | ICD-10-CM | POA: Diagnosis not present

## 2017-04-24 DIAGNOSIS — I5032 Chronic diastolic (congestive) heart failure: Secondary | ICD-10-CM | POA: Insufficient documentation

## 2017-04-24 DIAGNOSIS — Z881 Allergy status to other antibiotic agents status: Secondary | ICD-10-CM | POA: Diagnosis not present

## 2017-04-24 DIAGNOSIS — M419 Scoliosis, unspecified: Secondary | ICD-10-CM | POA: Insufficient documentation

## 2017-04-24 DIAGNOSIS — I82412 Acute embolism and thrombosis of left femoral vein: Principal | ICD-10-CM | POA: Insufficient documentation

## 2017-04-24 DIAGNOSIS — Z79899 Other long term (current) drug therapy: Secondary | ICD-10-CM | POA: Diagnosis not present

## 2017-04-24 DIAGNOSIS — I4891 Unspecified atrial fibrillation: Secondary | ICD-10-CM | POA: Insufficient documentation

## 2017-04-24 DIAGNOSIS — R079 Chest pain, unspecified: Secondary | ICD-10-CM | POA: Diagnosis present

## 2017-04-24 DIAGNOSIS — Z86718 Personal history of other venous thrombosis and embolism: Secondary | ICD-10-CM | POA: Insufficient documentation

## 2017-04-24 DIAGNOSIS — E039 Hypothyroidism, unspecified: Secondary | ICD-10-CM | POA: Insufficient documentation

## 2017-04-24 DIAGNOSIS — K219 Gastro-esophageal reflux disease without esophagitis: Secondary | ICD-10-CM | POA: Diagnosis not present

## 2017-04-24 DIAGNOSIS — I110 Hypertensive heart disease with heart failure: Secondary | ICD-10-CM | POA: Diagnosis not present

## 2017-04-24 DIAGNOSIS — Z8673 Personal history of transient ischemic attack (TIA), and cerebral infarction without residual deficits: Secondary | ICD-10-CM | POA: Diagnosis not present

## 2017-04-24 DIAGNOSIS — I252 Old myocardial infarction: Secondary | ICD-10-CM | POA: Insufficient documentation

## 2017-04-24 HISTORY — DX: Diverticulitis of intestine, part unspecified, without perforation or abscess without bleeding: K57.92

## 2017-04-24 HISTORY — DX: Gastro-esophageal reflux disease without esophagitis: K21.9

## 2017-04-24 HISTORY — DX: Deep phlebothrombosis in pregnancy, unspecified trimester: O22.30

## 2017-04-24 HISTORY — DX: Scoliosis, unspecified: M41.9

## 2017-04-24 HISTORY — DX: Other pulmonary embolism without acute cor pulmonale: I26.99

## 2017-04-24 HISTORY — DX: Chronic obstructive pulmonary disease, unspecified: J44.9

## 2017-04-24 HISTORY — DX: Transient cerebral ischemic attack, unspecified: G45.9

## 2017-04-24 HISTORY — DX: Disorder of thyroid, unspecified: E07.9

## 2017-04-24 HISTORY — DX: Malignant (primary) neoplasm, unspecified: C80.1

## 2017-04-24 HISTORY — DX: Unspecified osteoarthritis, unspecified site: M19.90

## 2017-04-24 LAB — BASIC METABOLIC PANEL
Anion gap: 10 (ref 5–15)
BUN: 22 mg/dL — AB (ref 6–20)
CHLORIDE: 104 mmol/L (ref 101–111)
CO2: 25 mmol/L (ref 22–32)
CREATININE: 1.26 mg/dL — AB (ref 0.44–1.00)
Calcium: 9.2 mg/dL (ref 8.9–10.3)
GFR, EST AFRICAN AMERICAN: 42 mL/min — AB (ref >60)
GFR, EST NON AFRICAN AMERICAN: 36 mL/min — AB (ref >60)
Glucose, Bld: 163 mg/dL — ABNORMAL HIGH (ref 65–99)
POTASSIUM: 3.9 mmol/L (ref 3.5–5.1)
SODIUM: 139 mmol/L (ref 135–145)

## 2017-04-24 LAB — CBC
HCT: 36 % (ref 35.0–47.0)
Hemoglobin: 11.9 g/dL — ABNORMAL LOW (ref 12.0–16.0)
MCH: 29.1 pg (ref 26.0–34.0)
MCHC: 33 g/dL (ref 32.0–36.0)
MCV: 88.1 fL (ref 80.0–100.0)
PLATELETS: 215 10*3/uL (ref 150–440)
RBC: 4.09 MIL/uL (ref 3.80–5.20)
RDW: 14.4 % (ref 11.5–14.5)
WBC: 9.9 10*3/uL (ref 3.6–11.0)

## 2017-04-24 LAB — TROPONIN I

## 2017-04-24 MED ORDER — GI COCKTAIL ~~LOC~~
30.0000 mL | Freq: Once | ORAL | Status: AC
  Administered 2017-04-24: 30 mL via ORAL

## 2017-04-24 MED ORDER — NITROGLYCERIN 0.4 MG SL SUBL
0.4000 mg | SUBLINGUAL_TABLET | Freq: Once | SUBLINGUAL | Status: AC
  Administered 2017-04-24: 0.4 mg via SUBLINGUAL

## 2017-04-24 MED ORDER — GI COCKTAIL ~~LOC~~
ORAL | Status: AC
  Filled 2017-04-24: qty 30

## 2017-04-24 NOTE — ED Provider Notes (Signed)
[facility] Emergency Department Provider Note   ____________________________________________    I have reviewed the triage vital signs and the nursing notes.   HISTORY  Chief Complaint Chest Pain and Shortness of Breath     HPI [name] is a 83 y.o. female presents with complaints of chest pain and shortness of breath.Patient notes her chest started gradually hurting today and she feels that her breathing is "not as good as normal". She denies fevers or chills or cough. No recent travel. No calf pain or swelling. Distant history of PE and also MI. Not on blood thinners. No nausea or vomiting or diaphoresis. She has not taken anything for this. No radiation of pain   Past Medical History:  Diagnosis Date  . Arthritis   . Blood clot in vein   . Cancer (HCC)    squamous cell carcinoma  . COPD (chronic obstructive pulmonary disease) ([place])   . Diverticulitis   . DVT (deep vein thrombosis) in pregnancy ([place])   . GERD (gastroesophageal reflux disease)   . Myocardial infarction ([place])   . Pacemaker   . PE (pulmonary thromboembolism) ([place])   . Scoliosis   . Stroke ([place])   . Thyroid disease   . TIA (transient ischemic attack)     Patient Active Problem List   Diagnosis Date Noted  . Chest pain, pleuritic 09/08/2015    Past Surgical History:  Procedure Laterality Date  . APPENDECTOMY    . BREAST SURGERY    . EYE SURGERY    . PACEMAKER IMPLANT    . TONSILLECTOMY      Prior to Admission medications   Medication Sig Start Date End Date Taking? Authorizing Provider  acetaminophen (TYLENOL) 325 MG tablet Take 650-1,000 mg by mouth every 6 (six) hours as needed.   Yes [provider]  aspirin EC 81 MG tablet Take 81 mg by mouth daily.   Yes [provider]  diphenhydramine-acetaminophen (TYLENOL PM) 25-500 MG TABS tablet Take 1 tablet by mouth at bedtime as needed.   Yes [provider]  furosemide (LASIX) 40 MG  tablet Take 40 mg by mouth.   Yes [provider]  levothyroxine (SYNTHROID, LEVOTHROID) 50 MCG tablet Take 50 mcg by mouth daily before breakfast. 30 minutes before breakfast   Yes [provider]  loratadine (CLARITIN) 10 MG tablet Take 10 mg by mouth daily as needed for allergies.   Yes [provider]  metoprolol tartrate (LOPRESSOR) 25 MG tablet Take 25 mg by mouth 2 (two) times daily.   Yes [provider]  mometasone (NASONEX) 50 MCG/ACT nasal spray Place 2 sprays into the nose daily.   Yes [provider]  nitroGLYCERIN (NITROSTAT) 0.4 MG SL tablet Place 0.4 mg under the tongue every 5 (five) minutes as needed for chest pain.   Yes [provider]  omeprazole (PRILOSEC) 20 MG capsule Take 20 mg by mouth 2 (two) times daily before a meal. 30 minutes before meals   Yes [provider]  polyethylene glycol (MIRALAX / GLYCOLAX) packet Take 17 g by mouth daily.   Yes [provider]  potassium chloride (K-DUR,KLOR-CON) 10 MEQ tablet Take 10 mEq by mouth daily.   Yes [provider]  predniSONE (DELTASONE) 5 MG tablet Take 5 mg by mouth daily with breakfast.   Yes [provider]  senna (SENOKOT) 8.6 MG TABS tablet Take 1 tablet by mouth 2 (two) times daily as needed for mild  constipation.    Yes [provider]  traMADol (ULTRAM) 50 MG tablet Take by mouth 4 (four) times daily as needed for moderate pain or severe pain.   Yes [provider]  Umeclidinium-Vilanterol (ANORO ELLIPTA) 62.5-25 MCG/INH AEPB Inhale 1 puff into the lungs daily.   Yes [provider]  vitamin B-12 (CYANOCOBALAMIN) 1000 MCG tablet Take 1,000 mcg by mouth daily.   Yes [provider]     Allergies Adhesive [tape]; Clindamycin/lincomycin; Codeine; Demerol [meperidine]; Erythromycin; Morphine and related; Penicillins; and Sulfa antibiotics  No family history on file.  Social History Social  History  Substance Use Topics  . Smoking status: Never Smoker  . Smokeless tobacco: Never Used  . Alcohol use No    Review of Systems  Constitutional: No fever/chills Eyes: No visual changes.  ENT: No sore throat. Cardiovascular: Central chest pain, Respiratory: As above Gastrointestinal: No abdominal pain.  No nausea, no vomiting.   Genitourinary: Negative for dysuria. Musculoskeletal: Negative for back pain. Skin: Negative for rash. Neurological: Negative for headaches    ____________________________________________   PHYSICAL EXAM:  VITAL SIGNS: ED Triage Vitals  Enc Vitals Group     BP 04/24/17 2017 (!) 113/48     Pulse Rate 04/24/17 2017 71     Resp 04/24/17 2017 18     Temp 04/24/17 2017 98.1 F (36.7 C)     Temp Source 04/24/17 2017 Oral     SpO2 04/24/17 2017 96 %     Weight 04/24/17 2009 60.3 kg (133 lb)     Height 04/24/17 2009 1.676 m (5\' 6" )     Head Circumference --      Peak Flow --      Pain Score 04/24/17 2009 8     Pain Loc --      Pain Edu? --      Excl. in [place]? --     Constitutional: Alert and oriented. No acute distress. Pleasant and interactive Eyes: Conjunctivae are normal.  Head: Atraumatic. Nose: No congestion/rhinnorhea. Mouth/Throat: Mucous membranes are moist.    Cardiovascular: Normal rate, regular rhythm. Grossly normal heart sounds.  Good peripheral circulation. Respiratory: Normal respiratory effort.  No retractions. Lungs CTAB. Gastrointestinal: Soft and nontender. No distention.  No CVA tenderness.  Musculoskeletal: No lower extremity tenderness nor edema.  Warm and well perfused Neurologic:  Normal speech and language. No gross focal neurologic deficits are appreciated.  Skin:  Skin is warm, dry and intact. No rash noted. Psychiatric: Mood and affect are normal. Speech and behavior are normal.  ____________________________________________   LABS (all labs ordered are listed, but only abnormal results are  displayed)  Labs Reviewed  BASIC METABOLIC PANEL - Abnormal; Notable for the following:       Result Value   Glucose, Bld 163 (*)    BUN 22 (*)    Creatinine, Ser 1.26 (*)    GFR calc non Af Amer 36 (*)    GFR calc Af Amer 42 (*)    All other components within normal limits  CBC - Abnormal; Notable for the following:    Hemoglobin 11.9 (*)    All other components within normal limits  TROPONIN I   ____________________________________________  EKG  ED ECG REPORT I, Lavonia Drafts, the attending physician, personally viewed and interpreted this ECG.  Date: 04/24/2017   Rhythm: Pacemaker, demand QRS Axis: normal Intervals: normal ST/T Wave abnormalities: Nonspecific changes   ____________________________________________  RADIOLOGY  Chest x-ray unremarkable ____________________________________________   PROCEDURES  Procedure(s) performed: No    Critical Care performed: No ____________________________________________   INITIAL IMPRESSION / ASSESSMENT AND PLAN / ED COURSE  Pertinent labs & imaging results that were available during my care of the patient were reviewed by me and considered in my medical decision making (see chart for details).  Patient presents with chest pain and mild shortness of breath. No pleurisy. Lab work is reassuring. Chest x-ray is normal. There appeared to be a component of gastritis but GI cocktail did not help at all. I'll admit the patient for further management by the hospitalist.    ____________________________________________   FINAL CLINICAL IMPRESSION(S) / ED DIAGNOSES  Final diagnoses:  Atypical chest pain      NEW MEDICATIONS STARTED DURING THIS VISIT:  New Prescriptions   No medications on file     Note:  This document was prepared using Dragon voice recognition software and may include unintentional dictation errors.    Lavonia Drafts, MD 04/24/17 [phone]

## 2017-04-25 ENCOUNTER — Observation Stay: Payer: Medicare Other

## 2017-04-25 ENCOUNTER — Observation Stay
Admit: 2017-04-25 | Discharge: 2017-04-25 | Disposition: A | Discharge status: Home / Self Care | Payer: Medicare Other | Attending: Family Medicine | Admitting: Family Medicine

## 2017-04-25 ENCOUNTER — Encounter: Payer: Self-pay | Admitting: Radiology

## 2017-04-25 DIAGNOSIS — I82412 Acute embolism and thrombosis of left femoral vein: Secondary | ICD-10-CM | POA: Diagnosis not present

## 2017-04-25 DIAGNOSIS — R079 Chest pain, unspecified: Secondary | ICD-10-CM | POA: Diagnosis present

## 2017-04-25 LAB — CBC
HEMATOCRIT: 35.9 % (ref 35.0–47.0)
HEMOGLOBIN: 11.8 g/dL — AB (ref 12.0–16.0)
MCH: 28.8 pg (ref 26.0–34.0)
MCHC: 32.9 g/dL (ref 32.0–36.0)
MCV: 87.6 fL (ref 80.0–100.0)
Platelets: 186 10*3/uL (ref 150–440)
RBC: 4.1 MIL/uL (ref 3.80–5.20)
RDW: 14.5 % (ref 11.5–14.5)
WBC: 8.6 10*3/uL (ref 3.6–11.0)

## 2017-04-25 LAB — BRAIN NATRIURETIC PEPTIDE: B NATRIURETIC PEPTIDE 5: 184 pg/mL — AB (ref 0.0–100.0)

## 2017-04-25 LAB — BASIC METABOLIC PANEL
ANION GAP: 8 (ref 5–15)
BUN: 21 mg/dL — ABNORMAL HIGH (ref 6–20)
CALCIUM: 9 mg/dL (ref 8.9–10.3)
CO2: 28 mmol/L (ref 22–32)
CREATININE: 1.02 mg/dL — AB (ref 0.44–1.00)
Chloride: 103 mmol/L (ref 101–111)
GFR calc non Af Amer: 47 mL/min — ABNORMAL LOW (ref >60)
GFR, EST AFRICAN AMERICAN: 54 mL/min — AB (ref >60)
GLUCOSE: 70 mg/dL (ref 65–99)
POTASSIUM: 3.6 mmol/L (ref 3.5–5.1)
Sodium: 139 mmol/L (ref 135–145)

## 2017-04-25 LAB — TROPONIN I
Troponin I: <0.03 ng/mL (ref <0.03)
Troponin I: <0.03 ng/mL (ref <0.03)
Troponin I: 0.03 ng/mL (ref <0.03)

## 2017-04-25 LAB — APTT: aPTT: 27 seconds (ref 24–36)

## 2017-04-25 LAB — PROTIME-INR
INR: 0.95
Prothrombin Time: 12.7 seconds (ref 11.4–15.2)

## 2017-04-25 LAB — FIBRIN DERIVATIVES D-DIMER (ARMC ONLY): Fibrin derivatives D-dimer (ARMC): 3892.22 — ABNORMAL HIGH (ref 0.00–499.00)

## 2017-04-25 MED ORDER — IOPAMIDOL (ISOVUE-370) INJECTION 76%
60.0000 mL | Freq: Once | INTRAVENOUS | Status: AC | PRN
  Administered 2017-04-25: 60 mL via INTRAVENOUS

## 2017-04-25 MED ORDER — WARFARIN - PHARMACIST DOSING INPATIENT
Freq: Every day | Status: DC
  Administered 2017-04-25: 1

## 2017-04-25 MED ORDER — LEVOTHYROXINE SODIUM 50 MCG PO TABS
50.0000 ug | ORAL_TABLET | Freq: Every day | ORAL | Status: DC
  Administered 2017-04-25 – 2017-04-26 (×2): 50 ug via ORAL
  Filled 2017-04-25 (×2): qty 1

## 2017-04-25 MED ORDER — ONDANSETRON HCL 4 MG/2ML IJ SOLN: 4.0000 mg | Freq: Four times a day (QID) | INTRAMUSCULAR | Status: DC | PRN

## 2017-04-25 MED ORDER — METOPROLOL TARTRATE 25 MG PO TABS
25.0000 mg | ORAL_TABLET | Freq: Two times a day (BID) | ORAL | Status: DC
  Administered 2017-04-25 – 2017-04-26 (×3): 25 mg via ORAL
  Filled 2017-04-25 (×3): qty 1

## 2017-04-25 MED ORDER — POTASSIUM CHLORIDE CRYS ER 20 MEQ PO TBCR
10.0000 meq | EXTENDED_RELEASE_TABLET | Freq: Every day | ORAL | Status: DC
  Administered 2017-04-25 – 2017-04-26 (×2): 10 meq via ORAL
  Filled 2017-04-25 (×2): qty 1

## 2017-04-25 MED ORDER — LORATADINE 10 MG PO TABS: 10.0000 mg | ORAL_TABLET | Freq: Every day | ORAL | Status: DC | PRN

## 2017-04-25 MED ORDER — HEPARIN SODIUM (PORCINE) 5000 UNIT/ML IJ SOLN: 5000.0000 [IU] | Freq: Three times a day (TID) | INTRAMUSCULAR | Status: DC

## 2017-04-25 MED ORDER — GI COCKTAIL ~~LOC~~
30.0000 mL | Freq: Four times a day (QID) | ORAL | Status: DC | PRN
  Filled 2017-04-25: qty 30

## 2017-04-25 MED ORDER — ASPIRIN EC 81 MG PO TBEC
81.0000 mg | DELAYED_RELEASE_TABLET | Freq: Every day | ORAL | Status: DC
  Administered 2017-04-25 – 2017-04-26 (×2): 81 mg via ORAL
  Filled 2017-04-25 (×2): qty 1

## 2017-04-25 MED ORDER — ACETAMINOPHEN 325 MG PO TABS
650.0000 mg | ORAL_TABLET | ORAL | Status: DC | PRN
  Administered 2017-04-25: 650 mg via ORAL
  Filled 2017-04-25: qty 2

## 2017-04-25 MED ORDER — PANTOPRAZOLE SODIUM 40 MG PO TBEC
40.0000 mg | DELAYED_RELEASE_TABLET | Freq: Every day | ORAL | Status: DC
  Administered 2017-04-25: 40 mg via ORAL
  Filled 2017-04-25: qty 1

## 2017-04-25 MED ORDER — VITAMIN B-12 1000 MCG PO TABS
1000.0000 ug | ORAL_TABLET | Freq: Every day | ORAL | Status: DC
  Administered 2017-04-25 – 2017-04-26 (×2): 1000 ug via ORAL
  Filled 2017-04-25 (×2): qty 1

## 2017-04-25 MED ORDER — DIPHENHYDRAMINE-APAP (SLEEP) 25-500 MG PO TABS: 1.0000 | ORAL_TABLET | Freq: Every evening | ORAL | Status: DC | PRN

## 2017-04-25 MED ORDER — TRAMADOL HCL 50 MG PO TABS
50.0000 mg | ORAL_TABLET | Freq: Two times a day (BID) | ORAL | Status: DC | PRN
  Administered 2017-04-25 (×2): 50 mg via ORAL
  Filled 2017-04-25 (×2): qty 1

## 2017-04-25 MED ORDER — HEPARIN (PORCINE) IN NACL 100-0.45 UNIT/ML-% IJ SOLN
1100.0000 [IU]/h | INTRAMUSCULAR | Status: DC
  Administered 2017-04-25: 1100 [IU]/h via INTRAVENOUS
  Filled 2017-04-25: qty 250

## 2017-04-25 MED ORDER — WARFARIN SODIUM 5 MG PO TABS
5.0000 mg | ORAL_TABLET | Freq: Every day | ORAL | Status: DC
  Administered 2017-04-25: 5 mg via ORAL
  Filled 2017-04-25: qty 1

## 2017-04-25 MED ORDER — PREDNISONE 10 MG PO TABS
5.0000 mg | ORAL_TABLET | Freq: Every day | ORAL | Status: DC
  Administered 2017-04-25 – 2017-04-26 (×2): 5 mg via ORAL
  Filled 2017-04-25 (×2): qty 1

## 2017-04-25 MED ORDER — FUROSEMIDE 40 MG PO TABS
40.0000 mg | ORAL_TABLET | Freq: Every day | ORAL | Status: DC
  Administered 2017-04-25 – 2017-04-26 (×2): 40 mg via ORAL
  Filled 2017-04-25 (×2): qty 1

## 2017-04-25 MED ORDER — ACETAMINOPHEN 500 MG PO TABS: 500.0000 mg | ORAL_TABLET | Freq: Every evening | ORAL | Status: DC | PRN

## 2017-04-25 MED ORDER — NITROGLYCERIN 0.4 MG SL SUBL: 0.4000 mg | SUBLINGUAL_TABLET | SUBLINGUAL | Status: DC | PRN

## 2017-04-25 MED ORDER — SENNA 8.6 MG PO TABS: 1.0000 | ORAL_TABLET | Freq: Two times a day (BID) | ORAL | Status: DC | PRN

## 2017-04-25 MED ORDER — ENOXAPARIN SODIUM 60 MG/0.6ML ~~LOC~~ SOLN
60.0000 mg | Freq: Two times a day (BID) | SUBCUTANEOUS | Status: DC
  Administered 2017-04-25 (×2): 60 mg via SUBCUTANEOUS
  Filled 2017-04-25 (×2): qty 0.6

## 2017-04-25 MED ORDER — DIPHENHYDRAMINE HCL 25 MG PO CAPS
25.0000 mg | ORAL_CAPSULE | Freq: Every evening | ORAL | Status: DC | PRN
  Administered 2017-04-25: 25 mg via ORAL
  Filled 2017-04-25 (×2): qty 1

## 2017-04-25 MED ORDER — UMECLIDINIUM-VILANTEROL 62.5-25 MCG/INH IN AEPB
1.0000 | INHALATION_SPRAY | Freq: Every day | RESPIRATORY_TRACT | Status: DC
  Administered 2017-04-25 – 2017-04-26 (×2): 1 via RESPIRATORY_TRACT
  Filled 2017-04-25: qty 14

## 2017-04-25 MED ORDER — SODIUM CHLORIDE 0.9 % IV SOLN
INTRAVENOUS | Status: DC
  Administered 2017-04-25: 04:00:00 via INTRAVENOUS

## 2017-04-25 MED ORDER — HEPARIN BOLUS VIA INFUSION
4000.0000 [IU] | Freq: Once | INTRAVENOUS | Status: AC
  Administered 2017-04-25: 4000 [IU] via INTRAVENOUS
  Filled 2017-04-25: qty 4000

## 2017-04-25 MED ORDER — FLUTICASONE PROPIONATE 50 MCG/ACT NA SUSP
1.0000 | Freq: Every day | NASAL | Status: DC
  Administered 2017-04-25 – 2017-04-26 (×2): 1 via NASAL
  Filled 2017-04-25: qty 16

## 2017-04-25 MED ORDER — POLYETHYLENE GLYCOL 3350 17 G PO PACK
17.0000 g | PACK | Freq: Every day | ORAL | Status: DC
  Administered 2017-04-25: 17 g via ORAL
  Filled 2017-04-25 (×2): qty 1

## 2017-04-25 NOTE — Progress Notes (Signed)
[facility] [place] NAME: [name]    MR#:  [id number]  DATE OF BIRTH:  07-May-1923  SUBJECTIVE:   Patient came in with some chest discomfort and left lower extremity cramping pain. She is sitting up and eating lunch. Denies any shortness of breath. Feels weak. Daughter in the room. REVIEW OF SYSTEMS:   Review of Systems  Constitutional: Negative for chills, fever and weight loss.  HENT: Negative for ear discharge, ear pain and nosebleeds.   Eyes: Negative for blurred vision, pain and discharge.  Respiratory: Negative for sputum production, shortness of breath, wheezing and stridor.   Cardiovascular: Positive for leg swelling. Negative for chest pain, palpitations, orthopnea and PND.  Gastrointestinal: Negative for abdominal pain, diarrhea, nausea and vomiting.  Genitourinary: Negative for frequency and urgency.  Musculoskeletal: Negative for back pain and joint pain.  Neurological: Positive for weakness. Negative for sensory change, speech change and focal weakness.  Psychiatric/Behavioral: Negative for depression and hallucinations. The patient is not nervous/anxious.    Tolerating Diet:yes Tolerating PT: pending  DRUG ALLERGIES:   Allergies  Allergen Reactions  . Adhesive [Tape] Itching    Reaction: itching  . Clindamycin/Lincomycin Other (See Comments)    Reaction: upset stomach   . Codeine Other (See Comments)  . Demerol [Meperidine] Nausea Only  . Erythromycin Other (See Comments)  . Morphine And Related     Reaction: unknown   . Penicillins Rash    Has patient had a PCN reaction causing immediate rash, facial/tongue/throat swelling, SOB or lightheadedness with hypotension: unknown Has patient had a PCN reaction causing severe rash involving mucus membranes or skin necrosis: unknown Has patient had a PCN reaction that required hospitalization: unknown  Has patient had a PCN reaction occurring within the last 10  years: unknown  If all of the above answers are "NO", then may proceed with Cephalosporin use.    . Sulfa Antibiotics Rash    VITALS:  Blood pressure 130/68, pulse 80, temperature 97.7 F (36.5 C), temperature source Oral, resp. rate 18, height 5\' 6"  (1.676 m), weight 61.4 kg (135 lb 4.8 oz), SpO2 100 %.  PHYSICAL EXAMINATION:   Physical Exam  GENERAL:  83 y.o.-year-old patient lying in the bed with no acute distress.  EYES: Pupils equal, round, reactive to light and accommodation. No scleral icterus. Extraocular muscles intact.  HEENT: Head atraumatic, normocephalic. Oropharynx and nasopharynx clear.  NECK:  Supple, no jugular venous distention. No thyroid enlargement, no tenderness.  LUNGS: Normal breath sounds bilaterally, no wheezing, rales, rhonchi. No use of accessory muscles of respiration.  CARDIOVASCULAR: S1, S2 normal. No murmurs, rubs, or gallops.  ABDOMEN: Soft, nontender, nondistended. Bowel sounds present. No organomegaly or mass.  EXTREMITIES: No cyanosis, clubbing or edema b/l.   Left calf mild tenderness NEUROLOGIC: Cranial nerves II through XII are intact. No focal Motor or sensory deficits b/l.   PSYCHIATRIC:  patient is alert and oriented x 3.  SKIN: No obvious rash, lesion, or ulcer.   LABORATORY PANEL:  CBC  Recent Labs Lab 04/25/17 0407  WBC 8.6  HGB 11.8*  HCT 35.9  PLT 186    Chemistries   Recent Labs Lab 04/25/17 0407  NA 139  K 3.6  CL 103  CO2 28  GLUCOSE 70  BUN 21*  CREATININE 1.02*  CALCIUM 9.0   Cardiac Enzymes  Recent Labs Lab 04/25/17 0941  TROPONINI 0.03*   RADIOLOGY:  Dg Chest 2 View  Result Date:  04/24/2017 CLINICAL DATA:  Chest pain. EXAM: CHEST  2 VIEW COMPARISON:  CT of the chest 09/08/2015 FINDINGS: Dual lead cardiac pacemaker. Enlarged cardiac silhouette. Mediastinal contours appear intact. Tortuosity and calcifications of the thoracic aorta. There is no evidence of focal airspace consolidation, pleural effusion  or pneumothorax. Osseous structures are without acute abnormality. Soft tissues are grossly normal. IMPRESSION: No evidence of focal airspace consolidation. Enlarged cardiac silhouette.  No evidence of pulmonary edema. Calcific atherosclerotic disease of the aorta. Electronically Signed   By: Fidela Salisbury M.D.   On: 04/24/2017 21:21   Ct Angio Chest Pe W Or Wo Contrast  Result Date: 04/25/2017 CLINICAL DATA:  83 year old with chest pain and shortness of breath. History of pulmonary embolus. High pretest probability for pulmonary embolus. EXAM: CT ANGIOGRAPHY CHEST WITH CONTRAST TECHNIQUE: Multidetector CT imaging of the chest was performed using the standard protocol during bolus administration of intravenous contrast. Multiplanar CT image reconstructions and MIPs were obtained to evaluate the vascular anatomy. CONTRAST:  60 cc Isovue 370 IV COMPARISON:  Radiographs yesterday.  Chest CT 09/08/2015 FINDINGS: Cardiovascular: There are no filling defects within the pulmonary arteries to suggest pulmonary embolus. Small web within the right lower lobe segmental pulmonary artery consistent with prior chronic pulmonary embolus. Atherosclerosis of the thoracic aorta which tortuous. Multi chamber cardiomegaly with marked right atrial dilatation. Left-sided pacemaker in place. Mediastinum/Nodes: No mediastinal or hilar adenopathy. Heterogeneous thyroid gland without dominant nodule. No axillary adenopathy. The upper esophagus is patulous, mild thickening of the distal most esophagus. Lungs/Pleura: Small scattered pulmonary nodules are stable or slightly decreased in size from prior exam. For example subpleural nodule in the right lower lobe image 64 series 6 measures 4 mm, previously 6 mm. No new or progressive pulmonary nodule. No consolidation. No pulmonary edema. Scattered linear atelectasis and nodular scarring in the right lower lobe, stable from prior exams. No pleural fluid. Mucus/debris in the trachea.  Upper Abdomen: Mild thickening of the distal esophagus. No acute abnormality. Musculoskeletal: There are no acute or suspicious osseous abnormalities. Review of the MIP images confirms the above findings. IMPRESSION: 1. No acute pulmonary embolus. Small web in the right lower lobe, sequela of prior/chronic pulmonary embolus. 2. Multi chamber cardiomegaly with prominent right atrial dilatation. Atherosclerosis of the thoracic aorta. 3. Stable small pulmonary nodules.  Right lower lobe scarring. 4. Minimal mucus/debris in the trachea. Aortic Atherosclerosis (ICD10-I70.0). Electronically Signed   By: Jeb Levering M.D.   On: 04/25/2017 02:15   US Venous Img Lower Bilateral  Result Date: 04/25/2017 CLINICAL DATA:  Shortness of breath.  History of pulmonary embolus. EXAM: BILATERAL LOWER EXTREMITY VENOUS DOPPLER ULTRASOUND TECHNIQUE: Gray-scale sonography with graded compression, as well as color Doppler and duplex ultrasound were performed to evaluate the lower extremity deep venous systems from the level of the common femoral vein and including the common femoral, femoral, profunda femoral, popliteal and calf veins including the posterior tibial, peroneal and gastrocnemius veins when visible. The superficial great saphenous vein was also interrogated. Spectral Doppler was utilized to evaluate flow at rest and with distal augmentation maneuvers in the common femoral, femoral and popliteal veins. COMPARISON:  None. FINDINGS: RIGHT LOWER EXTREMITY Common Femoral Vein: No evidence of thrombus. Normal compressibility, respiratory phasicity and response to augmentation. Saphenofemoral Junction: No evidence of thrombus. Normal compressibility and flow on color Doppler imaging. Profunda Femoral Vein: No evidence of thrombus. Normal compressibility and flow on color Doppler imaging. Femoral Vein: No evidence of thrombus. Normal compressibility, respiratory phasicity and response  to augmentation. Popliteal Vein: No  evidence of thrombus. Normal compressibility, respiratory phasicity and response to augmentation. Calf Veins: No evidence of thrombus. Normal compressibility and flow on color Doppler imaging. Superficial Great Saphenous Vein: No evidence of thrombus. Normal compressibility and flow on color Doppler imaging. Venous Reflux:  None. Other Findings:  None. LEFT LOWER EXTREMITY Common Femoral Vein: No evidence of thrombus. Normal compressibility, respiratory phasicity and response to augmentation. Saphenofemoral Junction: No evidence of thrombus. Normal compressibility and flow on color Doppler imaging. Profunda Femoral Vein: No evidence of thrombus. Normal compressibility and flow on color Doppler imaging. Femoral Vein: Nonocclusive thrombus in the proximal and mid femoral vein. Popliteal Vein: Nonocclusive thrombus in the distal popliteal vein. Calf Veins: Nonocclusive thrombus in the posterior tibial and peroneal vein. Superficial Great Saphenous Vein: No evidence of thrombus. Normal compressibility and flow on color Doppler imaging. Venous Reflux:  None. Other Findings:  None. IMPRESSION: 1. Positive for DVT with acute nonocclusive thrombus in the left femoral, popliteal and calf veins. 2. No right lower extremity DVT. These results will be called to the ordering clinician or representative by the Radiologist Assistant, and communication documented in the PACS or zVision Dashboard. Electronically Signed   By: Jeb Levering M.D.   On: 04/25/2017 03:48   ASSESSMENT AND PLAN:  83 y.o. female with a history of COPD, chronic diastolic congestive heart failure,, sick sinus syndrome, hypertension, atrial fibrillation, osteoarthritis GERD, DVT/PE, CAD status post MI, TIA/CVA, hypothyroidism now being admitted with:  1. Chest pain, ruled out ACS - Negative. Trend of troponins - When necessary nitro, beta blocker, aspirin ordered.   -CT chest negative for PE.  2. Left lower extremity nonocclusive DVT -And had  history of DVT in the remote past in 2007.  -Now on oral warfarin and Lovenox subcutaneous twice a day to bridge until INR is therapeutic -CT chest negative for PE  3. History of CAD Continue aspirin,, metoprolol  4. History of COPD Continue Claritin, Anoro, Flonase, O2 and nebs as needed  5. History of GERD Continue Protonix  6. History of hypothyroidism -Continue Synthroid  Discussed at length with daughter. Physical therapy to see patient. Will have home health arranged for education regarding Lovenox  Case discussed with Care Management/Social Worker. Management plans discussed with the patient, family and they are in agreement.  CODE STATUS: FULL  DVT Prophylaxis: Lovenox and Coumadin  TOTAL TIME TAKING CARE OF THIS PATIENT: *30* minutes.  >50% time spent on counselling and coordination of care  POSSIBLE D/C IN 1-2 DAYS, DEPENDING ON CLINICAL CONDITION.  Note: This dictation was prepared with Dragon dictation along with smaller phrase technology. Any transcriptional errors that result from this process are unintentional.  Roshana Shuffield M.D on 04/25/2017 at 2:06 PM  Between 7am to 6pm - Pager - [phone]  After 6pm go to [email or web address] - password [email or web address] [facility]  Office  [phone]  CC: Primary care physician; Idelle Crouch, MD

## 2017-04-25 NOTE — Care Management (Signed)
Patient placed in observation for chest pain.  has ruled in for DVT but ruled out for acute pulmonary embolus.  Previous history of pulmonary embolus.  Patient's daughter is at bedside.  Patient has round the clock caregivers in the home.  No issues accessing medical care, obtaining meds or with transportation.  Has access to a walker.  Currently on lovenox injections

## 2017-04-25 NOTE — H&P (Signed)
[place] @ [facility] Admission History and Physical Harvie Bridge, D.O.  ---------------------------------------------------------------------------------------------------------------------   PATIENT NAME: [name] MR#: [id number] DATE OF BIRTH: Sep 08, 1922 DATE OF ADMISSION: 04/24/2017 PRIMARY CARE PHYSICIAN: Idelle Crouch, MD  REQUESTING/REFERRING PHYSICIAN: ED Dr. Corky Downs  CHIEF COMPLAINT: Chief Complaint  Patient presents with  . Chest Pain  . Shortness of Breath    HISTORY OF PRESENT ILLNESS: [name] is a 83 y.o. female with a known history of COPD, chronic diastolic congestive heart failure,, sick sinus syndrome, hypertension, atrial fibrillation, osteoarthritis GERD, DVT/PE, CAD status post MI, TIA/CVA, hypothyroidism presents to the emergency department for evaluation of chest pain and shortness of breath.Patient describes progressively worsening localized left-sided chest pain and shortness of breath over the course of the past two days today not associated with any nausea, diaphoresis, palpitations. Patient denies fevers/chills, weakness, dizziness, N/V/C/D, abdominal pain, dysuria/frequency, changes in mental status.   Otherwise there has been no change in status. Patient has been taking medication as prescribed and there has been no recent change in medication or diet.  There has been no recent illness, travel or sick contacts.  Patient is independent, mobilizing with a walker.    EMS/ED COURSE:   Patient received nitroglycerin sublingual and GI cocktail which did not help. Medical admission was requested for further workup of chest pain.  PAST MEDICAL HISTORY: Past Medical History:  Diagnosis Date  . Arthritis   . Blood clot in vein   . Cancer (HCC)    squamous cell carcinoma  . COPD (chronic obstructive pulmonary disease) ([place])   . Diverticulitis   . DVT (deep vein thrombosis) in pregnancy ([place])   . GERD (gastroesophageal reflux  disease)   . Myocardial infarction ([place])   . Pacemaker   . PE (pulmonary thromboembolism) ([place])   . Scoliosis   . Stroke ([place])   . Thyroid disease   . TIA (transient ischemic attack)       PAST SURGICAL HISTORY: Past Surgical History:  Procedure Laterality Date  . APPENDECTOMY    . BREAST SURGERY    . EYE SURGERY    . PACEMAKER IMPLANT    . TONSILLECTOMY        SOCIAL HISTORY: Social History  Substance Use Topics  . Smoking status: Never Smoker  . Smokeless tobacco: Never Used  . Alcohol use No      FAMILY HISTORY: Family History   Medical History Relation Name Comments  Heart disease Mother    Myocardial Infarction (Heart attack) Mother    Breast cancer Other    Colon cancer Other    Coronary Artery Disease (Blocked arteries around heart) Other    Liver cancer Other    Lung cancer Other     Relation Name Status Comments  Brother  Deceased   Father  Deceased   Mother  Deceased   Other     Sister  Deceased       MEDICATIONS AT HOME: Prior to Admission medications   Medication Sig Start Date End Date Taking? Authorizing Provider  acetaminophen (TYLENOL) 325 MG tablet Take 650-1,000 mg by mouth every 6 (six) hours as needed.   Yes [provider]  aspirin EC 81 MG tablet Take 81 mg by mouth daily.   Yes [provider]  diphenhydramine-acetaminophen (TYLENOL PM) 25-500 MG TABS tablet Take 1 tablet by mouth at bedtime as needed.   Yes [provider]  furosemide (LASIX) 40 MG tablet Take 40 mg by mouth.  Yes [provider]  levothyroxine (SYNTHROID, LEVOTHROID) 50 MCG tablet Take 50 mcg by mouth daily before breakfast. 30 minutes before breakfast   Yes [provider]  loratadine (CLARITIN) 10 MG tablet Take 10 mg by mouth daily as needed for allergies.   Yes [provider]  metoprolol tartrate (LOPRESSOR) 25 MG tablet Take 25 mg by mouth 2 (two) times daily.   Yes [provider]  mometasone (NASONEX) 50 MCG/ACT nasal spray Place 2 sprays into the nose daily.   Yes [provider]  nitroGLYCERIN (NITROSTAT) 0.4 MG SL tablet Place 0.4 mg under the tongue every 5 (five) minutes as needed for chest pain.   Yes [provider]  omeprazole (PRILOSEC) 20 MG capsule Take 20 mg by mouth 2 (two) times daily before a meal. 30 minutes before meals   Yes [provider]  polyethylene glycol (MIRALAX / GLYCOLAX) packet Take 17 g by mouth daily.   Yes [provider]  potassium chloride (K-DUR,KLOR-CON) 10 MEQ tablet Take 10 mEq by mouth daily.   Yes [provider]  predniSONE (DELTASONE) 5 MG tablet Take 5 mg by mouth daily with breakfast.   Yes [provider]  senna (SENOKOT) 8.6 MG TABS tablet Take 1 tablet by mouth 2 (two) times daily as needed for mild constipation.    Yes [provider]  traMADol (ULTRAM) 50 MG tablet Take by mouth 4 (four) times daily as needed for moderate pain or severe pain.   Yes [provider]  Umeclidinium-Vilanterol (ANORO ELLIPTA) 62.5-25 MCG/INH AEPB Inhale 1 puff into the lungs daily.   Yes [provider]  vitamin B-12 (CYANOCOBALAMIN) 1000 MCG tablet Take 1,000 mcg by mouth daily.   Yes [provider]      DRUG ALLERGIES: Allergies  Allergen Reactions  . Adhesive [Tape] Itching    Reaction: itching  . Clindamycin/Lincomycin Other (See Comments)    Reaction: upset stomach   . Codeine Other (See Comments)  . Demerol [Meperidine] Nausea Only  . Erythromycin Other (See Comments)  . Morphine And Related     Reaction: unknown   . Penicillins Rash    Has patient had a PCN reaction causing immediate rash, facial/tongue/throat swelling, SOB or lightheadedness with hypotension: unknown Has patient had a PCN reaction causing severe rash involving mucus membranes or skin necrosis: unknown Has patient had a PCN reaction that required  hospitalization: unknown  Has patient had a PCN reaction occurring within the last 10 years: unknown  If all of the above answers are "NO", then may proceed with Cephalosporin use.    . Sulfa Antibiotics Rash     REVIEW OF SYSTEMS: CONSTITUTIONAL: No fatigue, weakness, fever, chills, weight gain/loss, headache EYES: No blurry or double vision. ENT: No tinnitus, postnasal drip, redness or soreness of the oropharynx. RESPIRATORY: Positive dyspnea, negative cough, wheeze, hemoptysis. CARDIOVASCULAR: Positive chest pain, negative orthopnea, palpitations, syncope. GASTROINTESTINAL: No nausea, vomiting, constipation, diarrhea, abdominal pain. No hematemesis, melena or hematochezia. GENITOURINARY: No dysuria, frequency, hematuria. ENDOCRINE: No polyuria or nocturia. No heat or cold intolerance. HEMATOLOGY: No anemia, bruising, bleeding. INTEGUMENTARY: No rashes, ulcers, lesions. MUSCULOSKELETAL: No pain, arthritis, swelling, gout. NEUROLOGIC: No numbness, tingling, weakness or ataxia. No seizure-type activity. PSYCHIATRIC: No anxiety, depression, insomnia.  PHYSICAL EXAMINATION: VITAL SIGNS: Blood pressure (!) 106/52, pulse (!) 59, temperature 98.1 F (36.7 C), temperature source Oral, resp. rate 13, height 5\' 6"  (1.676 m), weight 60.3 kg (133 lb), SpO2 96 %.  GENERAL: 83 y.o.-year-old  female patient, well-developed, well-nourished lying in the bed in no acute distress.  Pleasant and cooperative.   HEENT: Head atraumatic, normocephalic. Pupils equal, round, reactive to light and accommodation. No scleral icterus. Extraocular muscles intact. Oropharynx is clear. Mucus membranes moist. NECK: Supple, full range of motion. No JVD, no bruit heard. No cervical lymphadenopathy. CHEST: Normal breath sounds bilaterally. No wheezing, rales, rhonchi or crackles. No use of accessory muscles of respiration.  No reproducible chest wall tenderness.  CARDIOVASCULAR: S1, S2 normal. No murmurs, rubs, or  gallops appreciated. Cap refill <2 seconds. ABDOMEN: Soft, nontender, nondistended. No rebound, guarding, rigidity. Normoactive bowel sounds present in all four quadrants. No organomegaly or mass. EXTREMITIES: Full range of motion. No pedal edema, cyanosis, or clubbing.  Left calf tenderness, right calf mildly edematous, nonpitting.   NEUROLOGIC: Cranial nerves II through XII are grossly intact with no focal sensorimotor deficit. Muscle strength 5/5 in all extremities. Sensation intact. Gait not checked. PSYCHIATRIC: The patient is alert and oriented x 3. Normal affect, mood, thought content. SKIN: Warm, dry, and intact without obvious rash, lesion, or ulcer.  LABORATORY PANEL:  CBC  Recent Labs Lab 04/24/17 2038  WBC 9.9  HGB 11.9*  HCT 36.0  PLT 215   ----------------------------------------------------------------------------------------------------------------- Chemistries  Recent Labs Lab 04/24/17 2038  NA 139  K 3.9  CL 104  CO2 25  GLUCOSE 163*  BUN 22*  CREATININE 1.26*  CALCIUM 9.2   ------------------------------------------------------------------------------------------------------------------ Cardiac Enzymes  Recent Labs Lab 04/24/17 2038  TROPONINI <0.03   ------------------------------------------------------------------------------------------------------------------  RADIOLOGY: Dg Chest 2 View  Result Date: 04/24/2017 CLINICAL DATA:  Chest pain. EXAM: CHEST  2 VIEW COMPARISON:  CT of the chest 09/08/2015 FINDINGS: Dual lead cardiac pacemaker. Enlarged cardiac silhouette. Mediastinal contours appear intact. Tortuosity and calcifications of the thoracic aorta. There is no evidence of focal airspace consolidation, pleural effusion or pneumothorax. Osseous structures are without acute abnormality. Soft tissues are grossly normal. IMPRESSION: No evidence of focal airspace consolidation. Enlarged cardiac silhouette.  No evidence of pulmonary edema. Calcific  atherosclerotic disease of the aorta. Electronically Signed   By: Fidela Salisbury M.D.   On: 04/24/2017 21:21    EKG: Paced  IMPRESSION AND PLAN:  This is a 83 y.o. female with a history of COPD, chronic diastolic congestive heart failure,, sick sinus syndrome, hypertension, atrial fibrillation, osteoarthritis GERD, DVT/PE, CAD status post MI, TIA/CVA, hypothyroidism now being admitted with:  1. Chest pain, rule out ACS, rule out PE - Admit to observation with telemetry monitoring. - Trend troponins, check lipids and TSH. - Morphine, nitro, beta blocker, aspirin ordered.   - Consider cardiology consult - Check D-Dimer and CTA if positive given history of DVT/PE - Check BNP and echo given H/O heart failure  2. History of CAD Continue aspirin,, metoprolol  3. History of hypothyroidism -Continue Synthroid  4. History of COPD Continue Claritin, Anoro, Flonase, O2 and nebs as needed  5. History of GERD Continue Protonix   Admission status: Observation, telemetry Diet/Nutrition: Heart healthy Fluids: HL DVT Px: Heparin, SCDs and early ambulation Code Status: Full Disposition Plan: To home in <24 hours  All the records are reviewed and case discussed with ED provider. Management plans discussed with the patient and/or family who express understanding and agree with plan of care.   TOTAL TIME TAKING CARE OF THIS PATIENT: 60 minutes.   Travas Schexnayder D.O. on 04/25/2017 at 12:09 AM Between 7am to 6pm - Pager - [phone] After 6pm go to [email or web address] - password [email or web address]  [facility] [employer] Hospitalists Office [phone] CC: Primary care physician; Idelle Crouch, MD     Note: This dictation was prepared with Dragon dictation along with smaller phrase technology. Any transcriptional errors that result from this process are unintentional.

## 2017-04-25 NOTE — Progress Notes (Signed)
ANTICOAGULATION CONSULT NOTE - Initial Consult  Pharmacy Consult for heparin/warfarin  Indication: DVT  Allergies  Allergen Reactions  . Adhesive [Tape] Itching    Reaction: itching  . Clindamycin/Lincomycin Other (See Comments)    Reaction: upset stomach   . Codeine Other (See Comments)  . Demerol [Meperidine] Nausea Only  . Erythromycin Other (See Comments)  . Morphine And Related     Reaction: unknown   . Penicillins Rash    Has patient had a PCN reaction causing immediate rash, facial/tongue/throat swelling, SOB or lightheadedness with hypotension: unknown Has patient had a PCN reaction causing severe rash involving mucus membranes or skin necrosis: unknown Has patient had a PCN reaction that required hospitalization: unknown  Has patient had a PCN reaction occurring within the last 10 years: unknown  If all of the above answers are "NO", then may proceed with Cephalosporin use.    . Sulfa Antibiotics Rash    Patient Measurements: Height: 5\' 6"  (167.6 cm) Weight: 135 lb 4.8 oz (61.4 kg) IBW/kg (Calculated) : 59.3 Heparin Dosing Weight: 61.4 kg  Vital Signs: Temp: 97.7 F (36.5 C) (08/20 0401) Temp Source: Oral (08/20 0401) BP: 105/62 (08/20 0401) Pulse Rate: 106 (08/20 0401)  Labs:  Recent Labs  04/24/17 2038 04/25/17 0407 04/25/17 0516  HGB 11.9* 11.8*  --   HCT 36.0 35.9  --   PLT 215 186  --   APTT  --   --  27  LABPROT  --   --  12.7  INR  --   --  0.95  CREATININE 1.26* 1.02*  --   TROPONINI <0.03 <0.03  --     Estimated Creatinine Clearance: 33.6 mL/min (A) (by C-G formula based on SCr of 1.02 mg/dL (H)).   Medical History: Past Medical History:  Diagnosis Date  . Arthritis   . Blood clot in vein   . Cancer (HCC)    squamous cell carcinoma  . COPD (chronic obstructive pulmonary disease) ([place])   . Diverticulitis   . DVT (deep vein thrombosis) in pregnancy ([place])   . GERD (gastroesophageal reflux disease)   . Myocardial infarction ([place])    . Pacemaker   . PE (pulmonary thromboembolism) ([place])   . Scoliosis   . Stroke ([place])   . Thyroid disease   . TIA (transient ischemic attack)     Medications:  Scheduled:  . aspirin EC  81 mg Oral Daily  . enoxaparin (LOVENOX) injection  60 mg Subcutaneous Q12H  . fluticasone  1 spray Each Nare Daily  . furosemide  40 mg Oral Daily  . levothyroxine  50 mcg Oral QAC breakfast  . metoprolol tartrate  25 mg Oral BID  . pantoprazole  40 mg Oral QAC lunch  . polyethylene glycol  17 g Oral Daily  . potassium chloride  10 mEq Oral Daily  . predniSONE  5 mg Oral Q breakfast  . umeclidinium-vilanterol  1 puff Inhalation Daily  . vitamin B-12  1,000 mcg Oral Daily  . warfarin  5 mg Oral q1800  . Warfarin - Pharmacist Dosing Inpatient   Does not apply q1800    Assessment: Patient admitted for CP and SOB was found to be positive for DVT on [place]. Patient is not a candidate for rivaroxaban d/t CrCl < 30 ml/min and not a candidate for apixaban d/t age > 70 yrs and wt. 60 kg.  Patient is being newly started on warfarin for DVT and will be bridged with heparin for at least  5 days and until 2 therapeutic INRs achieved.  Goal of Therapy:  INR 2-3 Monitor platelets by anticoagulation protocol: Yes   Plan:  Will bolus w/ heparin 4000 units IV x 1 Will start drip rate at 1100 units/hr and will check HL @ 1400. Will start warfarin 5 mg empirically and adjusted based on INR trends Baseline labs ordered. Will monitor CBC and daily INRs.  Dr. Posey Pronto to start Lovenox in order to easily discharge patient in the future. Will start lovenox 60 mg subq q12h -- will start lovenox 1 hour after heparin drip stopped (0800)  Tobie Lords, PharmD, BCPS Clinical Pharmacist 04/25/2017

## 2017-04-25 NOTE — Care Management Obs Status (Signed)
[place] NOTIFICATION   Patient Details  Name: [name] MRN: [id number] Date of Birth: Apr 12, 1923   Medicare Observation Status Notification Given:  Yes Daughter at bedside declined to sign but has notice in hand and received explanation.  Copy sent to HIM to be scanned   Katrina Stack, RN 04/25/2017, 1:22 PM

## 2017-04-25 NOTE — Progress Notes (Signed)
US venous lower leg indicates positive for DVT with acute nonocclusive thrombus in the left femoral, popliteal and calf veins. MD Pyreddy made aware. Heparin ordered at . Pt is a hard stick with fragile veins and her existing IV infiltrated. New IV put in but only heparin started, NS held. MD aware. Will continue to monitor.

## 2017-04-25 NOTE — Progress Notes (Signed)
PT Cancellation Note  Patient Details Name: SRINIDHI LANDERS MRN: [id number] DOB: July 03, 1923   Cancelled Treatment:    Reason Eval/Treat Not Completed: Patient at procedure or test/unavailable (Consult received and chart reviewed.  Patient currently undergoing ECHO.  Will re-attempt next date as patient available and medically appropriate.)   Sava Proby H. Owens Shark, PT, DPT, NCS 04/25/17, 4:20 PM [phone]

## 2017-04-25 NOTE — Progress Notes (Signed)
*  PRELIMINARY RESULTS* Echocardiogram 2D Echocardiogram has been performed.  [name] 04/25/2017, 4:49 PM

## 2017-04-25 NOTE — ED Notes (Signed)
Patient r/f CT, alerting Lexy patient will be on the way up.

## 2017-04-25 NOTE — Care Management (Addendum)
Patient to discharge home on lovenox to bridge coumadin.  medication has been called to patient's [facility] [phone]- Lovenox (okay to use generic) 60 mg sq q 12h. #8 no refills.  Copay 6 dollars.  Informed patient's daughter and provided her with list of home health agencies.  [facility]ose [facility].  Contacted [facility] and referral accepted.  Initial visit to be made morning of 8/22. One of patient's caregivers will come to the unit tomorrow prior to discharge to learn injection. Updated primary nurse and discussed may need to adjust lovenox injection times to allow for injection education. Patient's pharmacy does have the medication in stock

## 2017-04-25 NOTE — Progress Notes (Signed)
ANTICOAGULATION CONSULT NOTE - Initial Consult  Pharmacy Consult for heparin/warfarin  Indication: DVT  Allergies  Allergen Reactions  . Adhesive [Tape] Itching    Reaction: itching  . Clindamycin/Lincomycin Other (See Comments)    Reaction: upset stomach   . Codeine Other (See Comments)  . Demerol [Meperidine] Nausea Only  . Erythromycin Other (See Comments)  . Morphine And Related     Reaction: unknown   . Penicillins Rash    Has patient had a PCN reaction causing immediate rash, facial/tongue/throat swelling, SOB or lightheadedness with hypotension: unknown Has patient had a PCN reaction causing severe rash involving mucus membranes or skin necrosis: unknown Has patient had a PCN reaction that required hospitalization: unknown  Has patient had a PCN reaction occurring within the last 10 years: unknown  If all of the above answers are "NO", then may proceed with Cephalosporin use.    . Sulfa Antibiotics Rash    Patient Measurements: Height: 5\' 6"  (167.6 cm) Weight: 135 lb 4.8 oz (61.4 kg) IBW/kg (Calculated) : 59.3 Heparin Dosing Weight: 61.4 kg  Vital Signs: Temp: 97.7 F (36.5 C) (08/20 0401) Temp Source: Oral (08/20 0401) BP: 105/62 (08/20 0401) Pulse Rate: 106 (08/20 0401)  Labs:  Recent Labs  04/24/17 2038 04/25/17 0407  HGB 11.9* 11.8*  HCT 36.0 35.9  PLT 215 186  CREATININE 1.26*  --   TROPONINI <0.03  --     Estimated Creatinine Clearance: 27.2 mL/min (A) (by C-G formula based on SCr of 1.26 mg/dL (H)).   Medical History: Past Medical History:  Diagnosis Date  . Arthritis   . Blood clot in vein   . Cancer (HCC)    squamous cell carcinoma  . COPD (chronic obstructive pulmonary disease) ([place])   . Diverticulitis   . DVT (deep vein thrombosis) in pregnancy ([place])   . GERD (gastroesophageal reflux disease)   . Myocardial infarction ([place])   . Pacemaker   . PE (pulmonary thromboembolism) ([place])   . Scoliosis   . Stroke ([place])   . Thyroid  disease   . TIA (transient ischemic attack)     Medications:  Scheduled:  . aspirin EC  81 mg Oral Daily  . fluticasone  1 spray Each Nare Daily  . furosemide  40 mg Oral Daily  . levothyroxine  50 mcg Oral QAC breakfast  . metoprolol tartrate  25 mg Oral BID  . pantoprazole  40 mg Oral QAC lunch  . polyethylene glycol  17 g Oral Daily  . potassium chloride  10 mEq Oral Daily  . predniSONE  5 mg Oral Q breakfast  . umeclidinium-vilanterol  1 puff Inhalation Daily  . vitamin B-12  1,000 mcg Oral Daily  . warfarin  5 mg Oral q1800  . Warfarin - Pharmacist Dosing Inpatient   Does not apply q1800    Assessment: Patient admitted for CP and SOB was found to be positive for DVT on [place]. Patient is not a candidate for rivaroxaban d/t CrCl < 30 ml/min and not a candidate for apixaban d/t age > 79 yrs and wt. 60 kg.  Patient is being newly started on warfarin for DVT and will be bridged with heparin for at least 5 days and until 2 therapeutic INRs achieved.  Goal of Therapy:  INR 2-3 Monitor platelets by anticoagulation protocol: Yes   Plan:  Will bolus w/ heparin 4000 units IV x 1 Will start drip rate at 1100 units/hr and will check HL @ 1400. Will start warfarin  5 mg empirically and adjusted based on INR trends Baseline labs ordered. Will monitor CBC and daily INRs.  Tobie Lords, PharmD, BCPS Clinical Pharmacist 04/25/2017

## 2017-04-25 NOTE — ED Notes (Signed)
Hospitalist at bedside 

## 2017-04-25 NOTE — ED Notes (Signed)
Patient transported to CT 

## 2017-04-26 DIAGNOSIS — I82412 Acute embolism and thrombosis of left femoral vein: Secondary | ICD-10-CM | POA: Diagnosis not present

## 2017-04-26 LAB — PROTIME-INR
INR: 1.05
PROTHROMBIN TIME: 13.7 s (ref 11.4–15.2)

## 2017-04-26 LAB — ECHOCARDIOGRAM COMPLETE
Height: 66 in
Weight: 2164.8 oz

## 2017-04-26 LAB — ALBUMIN: ALBUMIN: 3.6 g/dL (ref 3.5–5.0)

## 2017-04-26 MED ORDER — APIXABAN 5 MG PO TABS: 5.0000 mg | ORAL_TABLET | Freq: Two times a day (BID) | ORAL | Status: DC

## 2017-04-26 MED ORDER — APIXABAN 5 MG PO TABS: ORAL_TABLET | ORAL | 2 refills | Status: DC

## 2017-04-26 MED ORDER — WARFARIN SODIUM 3 MG PO TABS: 3.0000 mg | ORAL_TABLET | Freq: Every day | ORAL | Status: DC

## 2017-04-26 MED ORDER — APIXABAN 5 MG PO TABS
10.0000 mg | ORAL_TABLET | Freq: Two times a day (BID) | ORAL | Status: DC
  Administered 2017-04-26: 10 mg via ORAL
  Filled 2017-04-26: qty 2

## 2017-04-26 NOTE — Evaluation (Signed)
Physical Therapy Evaluation Patient Details Name: [name] MRN: [id number] DOB: 27-Nov-1922 Today's Date: 04/26/2017   History of Present Illness  presented to ER and admitted under observation for chest pain, SOB; noted with L LE non-occlusive DVT (anticoag initiated 8/20 at 0530)  Clinical Impression  Upon evaluation, patient alert and oriented; follows all commands and demonstrates good insight/safety awareness.  Bilat UE/LE strength and ROM grossly symmetrical and WFL for basic transfers and mobility; no focal weakness appreciated.   Able to complete bed mobility with mod indep and demonstrates good sitting balance/functional reach (>8"); sit/stand, basic transfers and gait (220') with RW, cga.  Slow and guarded, but fairly steady pace; completing 10' walk time in 10 seconds, indicative of increased fall risk with mobility. Would benefit from skilled PT to address above deficits and promote optimal return to PLOF; Recommend transition to [facility] upon discharge from acute hospitalization.     Follow Up Recommendations Home health PT    Equipment Recommendations   (has RW in home environment)    Recommendations for Other Services       Precautions / Restrictions Precautions Precautions: Fall;ICD/Pacemaker Restrictions Weight Bearing Restrictions: No      Mobility  Bed Mobility Overal bed mobility: Modified Independent                Transfers Overall transfer level: Needs assistance Equipment used: Rolling walker (2 wheeled) Transfers: Sit to/from Stand Sit to Stand: Min guard            Ambulation/Gait Ambulation/Gait assistance: Min guard Ambulation Distance (Feet): 220 Feet Assistive device: Rolling walker (2 wheeled)   Gait velocity: 10' walk time, 10 seconds Gait velocity interpretation: <1.8 ft/sec, indicative of risk for recurrent falls General Gait Details: forward flexed posture, reciprocal stepping pattern; slow and guarded performance, but no  buckling or LOB.  Stairs            Wheelchair Mobility    Modified Rankin (Stroke Patients Only)       Balance Overall balance assessment: Needs assistance Sitting-balance support: No upper extremity supported;Feet supported Sitting balance-Leahy Scale: Good Sitting balance - Comments: sitting functional reach >8" with good trunk control and abiltiy to return to upright   Standing balance support: Bilateral upper extremity supported Standing balance-Leahy Scale: Fair Standing balance comment: limited functional reach and SLS time; requires UE support for optimal safety                             Pertinent Vitals/Pain Pain Assessment: Faces Faces Pain Scale: Hurts a little bit Pain Location: back Pain Descriptors / Indicators: Sore Pain Intervention(s): Limited activity within patient's tolerance;Monitored during session;Repositioned    Home Living Family/patient expects to be discharged to:: Private residence Living Arrangements: Alone Available Help at Discharge: Personal care attendant;Available 24 hours/day Type of Home: House Home Access: Ramped entrance     Home Layout: Two level;Able to live on main level with bedroom/bathroom Home Equipment: Walker - 4 wheels;Grab bars - toilet;Grab bars - tub/shower      Prior Function Level of Independence: Needs assistance         Comments: Mod indep with 4WRW for basic household mobility; assist from caregiver as needed for ADLs.  Family/caregiver assist with household chores, meals and meds.  Does endorse single fall within previous six months.     Hand Dominance   Dominant Hand: Right    Extremity/Trunk Assessment   Upper Extremity Assessment  Upper Extremity Assessment: Overall WFL for tasks assessed    Lower Extremity Assessment Lower Extremity Assessment: Overall WFL for tasks assessed (grossly 4-/5 throughout)       Communication   Communication: No difficulties  Cognition  Arousal/Alertness: Awake/alert Behavior During Therapy: WFL for tasks assessed/performed Overall Cognitive Status: Within Functional Limits for tasks assessed                                        General Comments      Exercises Other Exercises Other Exercises: Reviewed role/importance of activity pacing and energy conservation; patient/family voiced understanding.   Assessment/Plan    PT Assessment Patient needs continued PT services  PT Problem List Decreased strength;Decreased activity tolerance;Decreased balance;Decreased mobility;Cardiopulmonary status limiting activity       PT Treatment Interventions DME instruction;Gait training;Functional mobility training;Therapeutic activities;Therapeutic exercise;Balance training;Patient/family education    PT Goals (Current goals can be found in the Care Plan section)  Acute Rehab PT Goals Patient Stated Goal: to return home PT Goal Formulation: With patient/family Time For Goal Achievement: 05/10/17 Potential to Achieve Goals: Good    Frequency Min 2X/week   Barriers to discharge Decreased caregiver support      Co-evaluation               AM-PAC PT "6 Clicks" Daily Activity  Outcome Measure Difficulty turning over in bed (including adjusting bedclothes, sheets and blankets)?: A Little Difficulty moving from lying on back to sitting on the side of the bed? : A Little Difficulty sitting down on and standing up from a chair with arms (e.g., wheelchair, bedside commode, etc,.)?: Unable Help needed moving to and from a bed to chair (including a wheelchair)?: A Little Help needed walking in hospital room?: A Little Help needed climbing 3-5 steps with a railing? : A Little 6 Click Score: 16    End of Session Equipment Utilized During Treatment: Gait belt Activity Tolerance: Patient tolerated treatment well Patient left: in bed;with call bell/phone within reach;with bed alarm set;with family/visitor  present Nurse Communication: Mobility status PT Visit Diagnosis: Muscle weakness (generalized) (M62.81);Difficulty in walking, not elsewhere classified (R26.2)    Time: 1610-9604 PT Time Calculation (min) (ACUTE ONLY): 21 min   Charges:   PT Evaluation $PT Eval Low Complexity: 1 Low PT Treatments $Therapeutic Activity: 8-22 mins   PT G Codes:   PT G-Codes **NOT FOR INPATIENT CLASS** Functional Assessment Tool Used: AM-PAC 6 Clicks Basic Mobility Functional Limitation: Mobility: Walking and moving around Mobility: Walking and Moving Around Current Status (V4098): At least 20 percent but less than 40 percent impaired, limited or restricted Mobility: Walking and Moving Around Goal Status [phone]): At least 1 percent but less than 20 percent impaired, limited or restricted    Cannon Arreola H. Owens Shark, PT, DPT, NCS 04/26/17, 10:12 AM [phone]

## 2017-04-26 NOTE — Discharge Summary (Signed)
[facility] NAME: [name]    MR#:  [id number]  DATE OF BIRTH:  [birth year]  DATE OF ADMISSION:  04/24/2017 ADMITTING PHYSICIAN: Harvie Bridge, DO  DATE OF DISCHARGE: 04/26/2017  PRIMARY CARE PHYSICIAN: Idelle Crouch, MD    ADMISSION DIAGNOSIS:  Shortness of breath [R06.02] Atypical chest pain [R07.89]  DISCHARGE DIAGNOSIS:  Left lower extremity DVT-acute  SECONDARY DIAGNOSIS:   Past Medical History:  Diagnosis Date  . Arthritis   . Blood clot in vein   . Cancer (HCC)    squamous cell carcinoma  . COPD (chronic obstructive pulmonary disease) ([place])   . Diverticulitis   . DVT (deep vein thrombosis) in pregnancy ([place])   . GERD (gastroesophageal reflux disease)   . Myocardial infarction ([place])   . Pacemaker   . PE (pulmonary thromboembolism) ([place])   . Scoliosis   . Stroke ([place])   . Thyroid disease   . TIA (transient ischemic attack)     HOSPITAL COURSE:   83 y.o.femalewith a history of COPD, chronic diastolic congestive heart failure,, sick sinus syndrome, hypertension, atrial fibrillation, osteoarthritis GERD, DVT/PE, CAD status post MI, TIA/CVA, hypothyroidismnow being admitted with:  1. Chest pain, ruled out ACS - Negative Trend of troponins - When necessary nitro, beta blocker, aspirin ordered.  -CT chest negative for PE.  2. Left lower extremity nonocclusive DVT-acute -And had history of DVT in the remote past in 2007.  -Patient was on oral warfarin and Lovenox subcutaneous twice a day ----discussed with the pharmacist and decision was made for patient to be changed to oral eliquis -CT chest negative for PE  3. History of CAD Continue aspirin,, metoprolol  4. History of COPD Continue Claritin, Anoro, Flonase, O2 and nebs as needed  5. History of GERD Continue Protonix  6. History of hypothyroidism -Continue Synthroid  Discussed at length with daughter in the room. Physical  therapy to see patient.  Discharge later today CONSULTS OBTAINED:    DRUG ALLERGIES:   Allergies  Allergen Reactions  . Adhesive [Tape] Itching    Reaction: itching  . Clindamycin/Lincomycin Other (See Comments)    Reaction: upset stomach   . Codeine Other (See Comments)  . Demerol [Meperidine] Nausea Only  . Erythromycin Other (See Comments)  . Morphine And Related     Reaction: unknown   . Penicillins Rash    Has patient had a PCN reaction causing immediate rash, facial/tongue/throat swelling, SOB or lightheadedness with hypotension: unknown Has patient had a PCN reaction causing severe rash involving mucus membranes or skin necrosis: unknown Has patient had a PCN reaction that required hospitalization: unknown  Has patient had a PCN reaction occurring within the last 10 years: unknown  If all of the above answers are "NO", then may proceed with Cephalosporin use.    . Sulfa Antibiotics Rash    DISCHARGE MEDICATIONS:   Current Discharge Medication List    START taking these medications   Details  apixaban (ELIQUIS) 5 MG TABS tablet Take 2 tabs twice a day for 7 days and then 1 tab twice a day from 05/03/17 Qty: 60 tablet, Refills: 2      CONTINUE these medications which have NOT CHANGED   Details  acetaminophen (TYLENOL) 325 MG tablet Take 650-1,000 mg by mouth every 6 (six) hours as needed.    aspirin EC 81 MG tablet Take 81 mg by mouth daily.    diphenhydramine-acetaminophen (TYLENOL PM) 25-500 MG TABS tablet  Take 1 tablet by mouth at bedtime as needed.    furosemide (LASIX) 40 MG tablet Take 40 mg by mouth.    levothyroxine (SYNTHROID, LEVOTHROID) 50 MCG tablet Take 50 mcg by mouth daily before breakfast. 30 minutes before breakfast    loratadine (CLARITIN) 10 MG tablet Take 10 mg by mouth daily as needed for allergies.    metoprolol tartrate (LOPRESSOR) 25 MG tablet Take 25 mg by mouth 2 (two) times daily.    mometasone (NASONEX) 50 MCG/ACT nasal spray  Place 2 sprays into the nose daily.    nitroGLYCERIN (NITROSTAT) 0.4 MG SL tablet Place 0.4 mg under the tongue every 5 (five) minutes as needed for chest pain.    omeprazole (PRILOSEC) 20 MG capsule Take 20 mg by mouth 2 (two) times daily before a meal. 30 minutes before meals    polyethylene glycol (MIRALAX / GLYCOLAX) packet Take 17 g by mouth daily.    potassium chloride (K-DUR,KLOR-CON) 10 MEQ tablet Take 10 mEq by mouth daily.    predniSONE (DELTASONE) 5 MG tablet Take 5 mg by mouth daily with breakfast.    senna (SENOKOT) 8.6 MG TABS tablet Take 1 tablet by mouth 2 (two) times daily as needed for mild constipation.     traMADol (ULTRAM) 50 MG tablet Take by mouth 4 (four) times daily as needed for moderate pain or severe pain.    Umeclidinium-Vilanterol (ANORO ELLIPTA) 62.5-25 MCG/INH AEPB Inhale 1 puff into the lungs daily.    vitamin B-12 (CYANOCOBALAMIN) 1000 MCG tablet Take 1,000 mcg by mouth daily.        If you experience worsening of your admission symptoms, develop shortness of breath, life threatening emergency, suicidal or homicidal thoughts you must seek medical attention immediately by calling 911 or calling your MD immediately  if symptoms less severe.  You Must read complete instructions/literature along with all the possible adverse reactions/side effects for all the Medicines you take and that have been prescribed to you. Take any new Medicines after you have completely understood and accept all the possible adverse reactions/side effects.   Please note  You were cared for by a hospitalist during your hospital stay. If you have any questions about your discharge medications or the care you received while you were in the hospital after you are discharged, you can call the unit and asked to speak with the hospitalist on call if the hospitalist that took care of you is not available. Once you are discharged, your primary care physician will handle any further medical  issues. Please note that NO REFILLS for any discharge medications will be authorized once you are discharged, as it is imperative that you return to your primary care physician (or establish a relationship with a primary care physician if you do not have one) for your aftercare needs so that they can reassess your need for medications and monitor your lab values. Today   SUBJECTIVE   Weak and tired  VITAL SIGNS:  Blood pressure 136/63, pulse (!) 59, temperature 97.7 F (36.5 C), temperature source Oral, resp. rate 16, height 5\' 6"  (1.676 m), weight 61.4 kg (135 lb 4.8 oz), SpO2 96 %.  I/O:   Intake/Output Summary (Last 24 hours) at 04/26/17 0802 Last data filed at 04/26/17 0640  Gross per 24 hour  Intake              120 ml  Output              900 ml  Net             -780 ml    PHYSICAL EXAMINATION:  GENERAL:  83 y.o.-year-old patient lying in the bed with no acute distress.  EYES: Pupils equal, round, reactive to light and accommodation. No scleral icterus. Extraocular muscles intact.  HEENT: Head atraumatic, normocephalic. Oropharynx and nasopharynx clear.  NECK:  Supple, no jugular venous distention. No thyroid enlargement, no tenderness.  LUNGS: Normal breath sounds bilaterally, no wheezing, rales,rhonchi or crepitation. No use of accessory muscles of respiration.  CARDIOVASCULAR: S1, S2 normal. No murmurs, rubs, or gallops.  ABDOMEN: Soft, non-tender, non-distended. Bowel sounds present. No organomegaly or mass.  EXTREMITIES: No pedal edema, cyanosis, or clubbing.  NEUROLOGIC: Cranial nerves II through XII are intact. Muscle strength 5/5 in all extremities. Sensation intact. Gait not checked.  PSYCHIATRIC: The patient is alert and oriented x 3.  SKIN: No obvious rash, lesion, or ulcer.   DATA REVIEW:   CBC   Recent Labs Lab 04/25/17 0407  WBC 8.6  HGB 11.8*  HCT 35.9  PLT 186    Chemistries   Recent Labs Lab 04/25/17 0407  NA 139  K 3.6  CL 103  CO2 28   GLUCOSE 70  BUN 21*  CREATININE 1.02*  CALCIUM 9.0    Microbiology Results   No results found for this or any previous visit (from the past 240 hour(s)).  RADIOLOGY:  Dg Chest 2 View  Result Date: 04/24/2017 CLINICAL DATA:  Chest pain. EXAM: CHEST  2 VIEW COMPARISON:  CT of the chest 09/08/2015 FINDINGS: Dual lead cardiac pacemaker. Enlarged cardiac silhouette. Mediastinal contours appear intact. Tortuosity and calcifications of the thoracic aorta. There is no evidence of focal airspace consolidation, pleural effusion or pneumothorax. Osseous structures are without acute abnormality. Soft tissues are grossly normal. IMPRESSION: No evidence of focal airspace consolidation. Enlarged cardiac silhouette.  No evidence of pulmonary edema. Calcific atherosclerotic disease of the aorta. Electronically Signed   By: Fidela Salisbury M.D.   On: 04/24/2017 21:21   Ct Angio Chest Pe W Or Wo Contrast  Result Date: 04/25/2017 CLINICAL DATA:  83 year old with chest pain and shortness of breath. History of pulmonary embolus. High pretest probability for pulmonary embolus. EXAM: CT ANGIOGRAPHY CHEST WITH CONTRAST TECHNIQUE: Multidetector CT imaging of the chest was performed using the standard protocol during bolus administration of intravenous contrast. Multiplanar CT image reconstructions and MIPs were obtained to evaluate the vascular anatomy. CONTRAST:  60 cc Isovue 370 IV COMPARISON:  Radiographs yesterday.  Chest CT 09/08/2015 FINDINGS: Cardiovascular: There are no filling defects within the pulmonary arteries to suggest pulmonary embolus. Small web within the right lower lobe segmental pulmonary artery consistent with prior chronic pulmonary embolus. Atherosclerosis of the thoracic aorta which tortuous. Multi chamber cardiomegaly with marked right atrial dilatation. Left-sided pacemaker in place. Mediastinum/Nodes: No mediastinal or hilar adenopathy. Heterogeneous thyroid gland without dominant nodule. No  axillary adenopathy. The upper esophagus is patulous, mild thickening of the distal most esophagus. Lungs/Pleura: Small scattered pulmonary nodules are stable or slightly decreased in size from prior exam. For example subpleural nodule in the right lower lobe image 64 series 6 measures 4 mm, previously 6 mm. No new or progressive pulmonary nodule. No consolidation. No pulmonary edema. Scattered linear atelectasis and nodular scarring in the right lower lobe, stable from prior exams. No pleural fluid. Mucus/debris in the trachea. Upper Abdomen: Mild thickening of the distal esophagus. No acute abnormality. Musculoskeletal: There are no acute or suspicious osseous abnormalities.  Review of the MIP images confirms the above findings. IMPRESSION: 1. No acute pulmonary embolus. Small web in the right lower lobe, sequela of prior/chronic pulmonary embolus. 2. Multi chamber cardiomegaly with prominent right atrial dilatation. Atherosclerosis of the thoracic aorta. 3. Stable small pulmonary nodules.  Right lower lobe scarring. 4. Minimal mucus/debris in the trachea. Aortic Atherosclerosis (ICD10-I70.0). Electronically Signed   By: Jeb Levering M.D.   On: 04/25/2017 02:15   US Venous Img Lower Bilateral  Result Date: 04/25/2017 CLINICAL DATA:  Shortness of breath.  History of pulmonary embolus. EXAM: BILATERAL LOWER EXTREMITY VENOUS DOPPLER ULTRASOUND TECHNIQUE: Gray-scale sonography with graded compression, as well as color Doppler and duplex ultrasound were performed to evaluate the lower extremity deep venous systems from the level of the common femoral vein and including the common femoral, femoral, profunda femoral, popliteal and calf veins including the posterior tibial, peroneal and gastrocnemius veins when visible. The superficial great saphenous vein was also interrogated. Spectral Doppler was utilized to evaluate flow at rest and with distal augmentation maneuvers in the common femoral, femoral and popliteal  veins. COMPARISON:  None. FINDINGS: RIGHT LOWER EXTREMITY Common Femoral Vein: No evidence of thrombus. Normal compressibility, respiratory phasicity and response to augmentation. Saphenofemoral Junction: No evidence of thrombus. Normal compressibility and flow on color Doppler imaging. Profunda Femoral Vein: No evidence of thrombus. Normal compressibility and flow on color Doppler imaging. Femoral Vein: No evidence of thrombus. Normal compressibility, respiratory phasicity and response to augmentation. Popliteal Vein: No evidence of thrombus. Normal compressibility, respiratory phasicity and response to augmentation. Calf Veins: No evidence of thrombus. Normal compressibility and flow on color Doppler imaging. Superficial Great Saphenous Vein: No evidence of thrombus. Normal compressibility and flow on color Doppler imaging. Venous Reflux:  None. Other Findings:  None. LEFT LOWER EXTREMITY Common Femoral Vein: No evidence of thrombus. Normal compressibility, respiratory phasicity and response to augmentation. Saphenofemoral Junction: No evidence of thrombus. Normal compressibility and flow on color Doppler imaging. Profunda Femoral Vein: No evidence of thrombus. Normal compressibility and flow on color Doppler imaging. Femoral Vein: Nonocclusive thrombus in the proximal and mid femoral vein. Popliteal Vein: Nonocclusive thrombus in the distal popliteal vein. Calf Veins: Nonocclusive thrombus in the posterior tibial and peroneal vein. Superficial Great Saphenous Vein: No evidence of thrombus. Normal compressibility and flow on color Doppler imaging. Venous Reflux:  None. Other Findings:  None. IMPRESSION: 1. Positive for DVT with acute nonocclusive thrombus in the left femoral, popliteal and calf veins. 2. No right lower extremity DVT. These results will be called to the ordering clinician or representative by the Radiologist Assistant, and communication documented in the PACS or zVision Dashboard. Electronically  Signed   By: Jeb Levering M.D.   On: 04/25/2017 03:48     Management plans discussed with the patient, family and they are in agreement.  CODE STATUS:     Code Status Orders        Start     Ordered   04/25/17 0349  Full code  Continuous     04/25/17 0348    Code Status History    Date Active Date Inactive Code Status Order ID Comments User Context   09/08/2015  5:33 PM 09/09/2015  5:47 PM Full Code [id number]  Fritzi Mandes, MD Inpatient    Advance Directive Documentation     Most Recent Value  Type of Advance Directive  Healthcare Power of Attorney, Living will  Pre-existing out of facility DNR order (yellow form or pink MOST form)  -  "  MOST" Form in Place?  -      TOTAL TIME TAKING CARE OF THIS PATIENT: *40* minutes.    Hartlyn Reigel M.D on 04/26/2017 at 8:02 AM  Between 7am to 6pm - Pager - [phone] After 6pm go to [email or web address] - password [email or web address] [facility]  Office  [phone]  CC: Primary care physician; Idelle Crouch, MD

## 2017-04-26 NOTE — Progress Notes (Signed)
Patient given discharge teaching and paperwork regarding medications, diet, follow-up appointments and activity. Patient understanding verbalized. No complaints at this time. IV and telemetry discontinued prior to leaving. Skin assessment as previously charted and vitals are stable; on room air. Patient being discharged to home - home health PT set up by Nann, CM. Caregiver/family present during discharge teaching. No further needs by Care Management.

## 2017-04-26 NOTE — Care Management (Signed)
Patient for discharge today. It has been decided now that she will discharge on Eliquis rather than Lovenox.  Cancelled prescription for lovenox that was called into patient's pharmacy yesterday.  Patient's physical therapy consult is still pending.  Notified [facility] of discharge today and home health order for SN and PT.  Family to transport home.  Provided patient with 30 day trial coupon for Eliquis

## 2017-04-26 NOTE — Progress Notes (Signed)
PT Cancellation Note  Patient Details Name: [name] MRN: [id number] DOB: 1923/05/19   Cancelled Treatment:    Reason Eval/Treat Not Completed: Patient at procedure or test/unavailable (Consult received and chart reviewed.  Patient currently with pharmacist to review discharge medications; eating breakfast.  Will re-attempt at later time this AM.)   [name] H. Owens Shark, PT, DPT, NCS 04/26/17, 8:58 AM [phone]

## 2017-04-26 NOTE — Discharge Instructions (Signed)
Information on my medicine - ELIQUIS (apixaban)  This medication education was reviewed with me or my healthcare representative as part of my discharge preparation.  The pharmacist that spoke with me during my hospital stay was:  Ramond Dial, [facility]  Why was Eliquis prescribed for you? Eliquis was prescribed to treat blood clots that may have been found in the veins of your legs (deep vein thrombosis) or in your lungs (pulmonary embolism) and to reduce the risk of them occurring again.  What do You need to know about Eliquis ? The starting dose is 10 mg (two 5 mg tablets) taken TWICE daily for the FIRST SEVEN (7) DAYS, then on (enter date)  8/28  the dose is reduced to ONE 5 mg tablet taken TWICE daily.  Eliquis may be taken with or without food.   Try to take the dose about the same time in the morning and in the evening. If you have difficulty swallowing the tablet whole please discuss with your pharmacist how to take the medication safely.  Take Eliquis exactly as prescribed and DO NOT stop taking Eliquis without talking to the doctor who prescribed the medication.  Stopping may increase your risk of developing a new blood clot.  Refill your prescription before you run out.  After discharge, you should have regular check-up appointments with your healthcare provider that is prescribing your Eliquis.    What do you do if you miss a dose? If a dose of ELIQUIS is not taken at the scheduled time, take it as soon as possible on the same day and twice-daily administration should be resumed. The dose should not be doubled to make up for a missed dose.  Important Safety Information A possible side effect of Eliquis is bleeding. You should call your healthcare provider right away if you experience any of the following: ? Bleeding from an injury or your nose that does not stop. ? Unusual colored urine (red or dark brown) or unusual colored stools (red or black). ? Unusual bruising for  unknown reasons. ? A serious fall or if you hit your head (even if there is no bleeding).  Some medicines may interact with Eliquis and might increase your risk of bleeding or clotting while on Eliquis. To help avoid this, consult your healthcare provider or pharmacist prior to using any new prescription or non-prescription medications, including herbals, vitamins, non-steroidal anti-inflammatory drugs (NSAIDs) and supplements.  This website has more information on Eliquis (apixaban): http://[email or web address].com/eliquis/home

## 2017-04-26 NOTE — Progress Notes (Signed)
ANTICOAGULATION CONSULT NOTE - Initial Consult  Pharmacy Consult for apixaban Indication: DVT  Allergies  Allergen Reactions  . Adhesive [Tape] Itching    Reaction: itching  . Clindamycin/Lincomycin Other (See Comments)    Reaction: upset stomach   . Codeine Other (See Comments)  . Demerol [Meperidine] Nausea Only  . Erythromycin Other (See Comments)  . Morphine And Related     Reaction: unknown   . Penicillins Rash    Has patient had a PCN reaction causing immediate rash, facial/tongue/throat swelling, SOB or lightheadedness with hypotension: unknown Has patient had a PCN reaction causing severe rash involving mucus membranes or skin necrosis: unknown Has patient had a PCN reaction that required hospitalization: unknown  Has patient had a PCN reaction occurring within the last 10 years: unknown  If all of the above answers are "NO", then may proceed with Cephalosporin use.    . Sulfa Antibiotics Rash    Patient Measurements: Height: 5\' 6"  (167.6 cm) Weight: 135 lb 4.8 oz (61.4 kg) IBW/kg (Calculated) : 59.3 Heparin Dosing Weight: 61.4 kg  Vital Signs: Temp: 97.7 F (36.5 C) (08/21 0549) Temp Source: Oral (08/21 0549) BP: 136/63 (08/21 0549) Pulse Rate: 59 (08/21 0549)  Labs:  Recent Labs  04/24/17 2038 04/25/17 0407 04/25/17 0516 04/25/17 0651 04/25/17 0941 04/26/17 0504  HGB 11.9* 11.8*  --   --   --   --   HCT 36.0 35.9  --   --   --   --   PLT 215 186  --   --   --   --   APTT  --   --  27  --   --   --   LABPROT  --   --  12.7  --   --  13.7  INR  --   --  0.95  --   --  1.05  CREATININE 1.26* 1.02*  --   --   --   --   TROPONINI <0.03 <0.03  --  <0.03 0.03*  --     Estimated Creatinine Clearance: 33.6 mL/min (A) (by C-G formula based on SCr of 1.02 mg/dL (H)).   Medical History: Past Medical History:  Diagnosis Date  . Arthritis   . Blood clot in vein   . Cancer (HCC)    squamous cell carcinoma  . COPD (chronic obstructive pulmonary  disease) ([place])   . Diverticulitis   . DVT (deep vein thrombosis) in pregnancy ([place])   . GERD (gastroesophageal reflux disease)   . Myocardial infarction ([place])   . Pacemaker   . PE (pulmonary thromboembolism) ([place])   . Scoliosis   . Stroke ([place])   . Thyroid disease   . TIA (transient ischemic attack)     Medications:  Scheduled:  . aspirin EC  81 mg Oral Daily  . enoxaparin (LOVENOX) injection  60 mg Subcutaneous Q12H  . fluticasone  1 spray Each Nare Daily  . furosemide  40 mg Oral Daily  . levothyroxine  50 mcg Oral QAC breakfast  . metoprolol tartrate  25 mg Oral BID  . pantoprazole  40 mg Oral QAC lunch  . polyethylene glycol  17 g Oral Daily  . potassium chloride  10 mEq Oral Daily  . predniSONE  5 mg Oral Q breakfast  . umeclidinium-vilanterol  1 puff Inhalation Daily  . vitamin B-12  1,000 mcg Oral Daily  . warfarin  3 mg Oral q1800  . Warfarin - Pharmacist Dosing Inpatient  Does not apply q1800    Assessment: Patient admitted for CP and SOB was found to be positive for DVT on [place]. Pharmacy consulted to dose apixaban. Pt originally started of lovenox injections. Last lovenox injection last night.   Goal of Therapy:  INR 2-3 Monitor platelets by anticoagulation protocol: Yes   Plan:  apixaban 10mg  BID x 7 days then 5mg  BID.  Could consider reducing to 2.5mg  BID after 6 months of treatment.  Ramond Dial, Pharm.D, BCPS Clinical Pharmacist  04/26/2017

## 2017-12-09 ENCOUNTER — Emergency Department: Payer: Medicare Other

## 2017-12-09 ENCOUNTER — Observation Stay
Admission: EM | Admit: 2017-12-09 | Discharge: 2017-12-10 | Disposition: A | Discharge status: Home Health | Payer: Medicare Other | Attending: Internal Medicine | Admitting: Internal Medicine

## 2017-12-09 ENCOUNTER — Encounter: Payer: Self-pay | Admitting: Emergency Medicine

## 2017-12-09 ENCOUNTER — Other Ambulatory Visit: Payer: Self-pay

## 2017-12-09 DIAGNOSIS — M419 Scoliosis, unspecified: Secondary | ICD-10-CM | POA: Diagnosis not present

## 2017-12-09 DIAGNOSIS — R4182 Altered mental status, unspecified: Secondary | ICD-10-CM | POA: Diagnosis present

## 2017-12-09 DIAGNOSIS — N183 Chronic kidney disease, stage 3 unspecified: Secondary | ICD-10-CM | POA: Diagnosis present

## 2017-12-09 DIAGNOSIS — K219 Gastro-esophageal reflux disease without esophagitis: Secondary | ICD-10-CM | POA: Diagnosis not present

## 2017-12-09 DIAGNOSIS — T43595A Adverse effect of other antipsychotics and neuroleptics, initial encounter: Principal | ICD-10-CM | POA: Insufficient documentation

## 2017-12-09 DIAGNOSIS — I2510 Atherosclerotic heart disease of native coronary artery without angina pectoris: Secondary | ICD-10-CM | POA: Diagnosis present

## 2017-12-09 DIAGNOSIS — Z86711 Personal history of pulmonary embolism: Secondary | ICD-10-CM | POA: Diagnosis not present

## 2017-12-09 DIAGNOSIS — R441 Visual hallucinations: Secondary | ICD-10-CM | POA: Insufficient documentation

## 2017-12-09 DIAGNOSIS — Z86718 Personal history of other venous thrombosis and embolism: Secondary | ICD-10-CM | POA: Insufficient documentation

## 2017-12-09 DIAGNOSIS — J449 Chronic obstructive pulmonary disease, unspecified: Secondary | ICD-10-CM | POA: Diagnosis not present

## 2017-12-09 DIAGNOSIS — N179 Acute kidney failure, unspecified: Secondary | ICD-10-CM | POA: Diagnosis present

## 2017-12-09 DIAGNOSIS — Z7982 Long term (current) use of aspirin: Secondary | ICD-10-CM | POA: Insufficient documentation

## 2017-12-09 DIAGNOSIS — I252 Old myocardial infarction: Secondary | ICD-10-CM | POA: Diagnosis not present

## 2017-12-09 DIAGNOSIS — Z882 Allergy status to sulfonamides status: Secondary | ICD-10-CM | POA: Diagnosis not present

## 2017-12-09 DIAGNOSIS — Y92009 Unspecified place in unspecified non-institutional (private) residence as the place of occurrence of the external cause: Secondary | ICD-10-CM | POA: Diagnosis not present

## 2017-12-09 DIAGNOSIS — I700 Atherosclerosis of aorta: Secondary | ICD-10-CM | POA: Insufficient documentation

## 2017-12-09 DIAGNOSIS — Z885 Allergy status to narcotic agent status: Secondary | ICD-10-CM | POA: Insufficient documentation

## 2017-12-09 DIAGNOSIS — Z880 Allergy status to penicillin: Secondary | ICD-10-CM | POA: Insufficient documentation

## 2017-12-09 DIAGNOSIS — Z950 Presence of cardiac pacemaker: Secondary | ICD-10-CM | POA: Diagnosis not present

## 2017-12-09 DIAGNOSIS — Z881 Allergy status to other antibiotic agents status: Secondary | ICD-10-CM | POA: Insufficient documentation

## 2017-12-09 DIAGNOSIS — Z79899 Other long term (current) drug therapy: Secondary | ICD-10-CM | POA: Insufficient documentation

## 2017-12-09 DIAGNOSIS — Z888 Allergy status to other drugs, medicaments and biological substances status: Secondary | ICD-10-CM | POA: Diagnosis not present

## 2017-12-09 DIAGNOSIS — Z8673 Personal history of transient ischemic attack (TIA), and cerebral infarction without residual deficits: Secondary | ICD-10-CM | POA: Diagnosis not present

## 2017-12-09 DIAGNOSIS — R443 Hallucinations, unspecified: Secondary | ICD-10-CM | POA: Diagnosis present

## 2017-12-09 DIAGNOSIS — E039 Hypothyroidism, unspecified: Secondary | ICD-10-CM | POA: Diagnosis present

## 2017-12-09 HISTORY — DX: Atherosclerotic heart disease of native coronary artery without angina pectoris: I25.10

## 2017-12-09 LAB — CBC WITH DIFFERENTIAL/PLATELET
Basophils Absolute: 0.1 10*3/uL (ref 0–0.1)
Basophils Relative: 1 %
Eosinophils Absolute: 0.1 10*3/uL (ref 0–0.7)
Eosinophils Relative: 2 %
HEMATOCRIT: 38.4 % (ref 35.0–47.0)
HEMOGLOBIN: 12.6 g/dL (ref 12.0–16.0)
LYMPHS ABS: 1.6 10*3/uL (ref 1.0–3.6)
Lymphocytes Relative: 20 %
MCH: 28.2 pg (ref 26.0–34.0)
MCHC: 32.7 g/dL (ref 32.0–36.0)
MCV: 86.1 fL (ref 80.0–100.0)
MONOS PCT: 6 %
Monocytes Absolute: 0.5 10*3/uL (ref 0.2–0.9)
NEUTROS ABS: 5.7 10*3/uL (ref 1.4–6.5)
Neutrophils Relative %: 71 %
Platelets: 206 10*3/uL (ref 150–440)
RBC: 4.46 MIL/uL (ref 3.80–5.20)
RDW: 14.5 % (ref 11.5–14.5)
WBC: 7.9 10*3/uL (ref 3.6–11.0)

## 2017-12-09 LAB — COMPREHENSIVE METABOLIC PANEL
ALK PHOS: 91 U/L (ref 38–126)
ALT: 10 U/L — AB (ref 14–54)
AST: 18 U/L (ref 15–41)
Albumin: 4.5 g/dL (ref 3.5–5.0)
Anion gap: 9 (ref 5–15)
BUN: 20 mg/dL (ref 6–20)
CALCIUM: 9.5 mg/dL (ref 8.9–10.3)
CO2: 29 mmol/L (ref 22–32)
CREATININE: 1.37 mg/dL — AB (ref 0.44–1.00)
Chloride: 100 mmol/L — ABNORMAL LOW (ref 101–111)
GFR, EST AFRICAN AMERICAN: 38 mL/min — AB (ref >60)
GFR, EST NON AFRICAN AMERICAN: 32 mL/min — AB (ref >60)
Glucose, Bld: 104 mg/dL — ABNORMAL HIGH (ref 65–99)
Potassium: 4.3 mmol/L (ref 3.5–5.1)
SODIUM: 138 mmol/L (ref 135–145)
Total Bilirubin: 0.8 mg/dL (ref 0.3–1.2)
Total Protein: 7.3 g/dL (ref 6.5–8.1)

## 2017-12-09 LAB — URINE DRUG SCREEN, QUALITATIVE (ARMC ONLY)
AMPHETAMINES, UR SCREEN: NOT DETECTED
BARBITURATES, UR SCREEN: NOT DETECTED
BENZODIAZEPINE, UR SCRN: NOT DETECTED
Cannabinoid 50 Ng, Ur ~~LOC~~: NOT DETECTED
Cocaine Metabolite,Ur ~~LOC~~: NOT DETECTED
MDMA (Ecstasy)Ur Screen: NOT DETECTED
METHADONE SCREEN, URINE: NOT DETECTED
Opiate, Ur Screen: NOT DETECTED
PHENCYCLIDINE (PCP) UR S: NOT DETECTED
Tricyclic, Ur Screen: NOT DETECTED

## 2017-12-09 LAB — URINALYSIS, COMPLETE (UACMP) WITH MICROSCOPIC
Bacteria, UA: NONE SEEN
Bilirubin Urine: NEGATIVE
Glucose, UA: NEGATIVE mg/dL
KETONES UR: NEGATIVE mg/dL
Leukocytes, UA: NEGATIVE
Nitrite: NEGATIVE
PROTEIN: NEGATIVE mg/dL
Specific Gravity, Urine: 1.009 (ref 1.005–1.030)
pH: 6 (ref 5.0–8.0)

## 2017-12-09 LAB — AMMONIA: Ammonia: 13 umol/L (ref 9–35)

## 2017-12-09 LAB — ETHANOL: Alcohol, Ethyl (B): <10 mg/dL (ref <10)

## 2017-12-09 MED ORDER — ASPIRIN EC 81 MG PO TBEC
81.0000 mg | DELAYED_RELEASE_TABLET | Freq: Every day | ORAL | Status: DC
  Administered 2017-12-10: 81 mg via ORAL
  Filled 2017-12-09: qty 1

## 2017-12-09 MED ORDER — ONDANSETRON HCL 4 MG/2ML IJ SOLN: 4.0000 mg | Freq: Four times a day (QID) | INTRAMUSCULAR | Status: DC | PRN

## 2017-12-09 MED ORDER — ACETAMINOPHEN 650 MG RE SUPP: 650.0000 mg | Freq: Four times a day (QID) | RECTAL | Status: DC | PRN

## 2017-12-09 MED ORDER — SODIUM CHLORIDE 0.9 % IV SOLN
INTRAVENOUS | Status: AC
  Administered 2017-12-09: 23:00:00 via INTRAVENOUS

## 2017-12-09 MED ORDER — APIXABAN 5 MG PO TABS
5.0000 mg | ORAL_TABLET | Freq: Two times a day (BID) | ORAL | Status: DC
  Administered 2017-12-09 – 2017-12-10 (×2): 5 mg via ORAL
  Filled 2017-12-09 (×2): qty 1

## 2017-12-09 MED ORDER — PANTOPRAZOLE SODIUM 20 MG PO TBEC: 20.0000 mg | DELAYED_RELEASE_TABLET | Freq: Two times a day (BID) | ORAL | Status: DC

## 2017-12-09 MED ORDER — ACETAMINOPHEN 325 MG PO TABS: 650.0000 mg | ORAL_TABLET | Freq: Four times a day (QID) | ORAL | Status: DC | PRN

## 2017-12-09 MED ORDER — SODIUM CHLORIDE 0.9 % IV BOLUS
500.0000 mL | Freq: Once | INTRAVENOUS | Status: AC
  Administered 2017-12-09: 500 mL via INTRAVENOUS

## 2017-12-09 MED ORDER — LEVOTHYROXINE SODIUM 50 MCG PO TABS
50.0000 ug | ORAL_TABLET | Freq: Every day | ORAL | Status: DC
  Administered 2017-12-10: 50 ug via ORAL
  Filled 2017-12-09: qty 1

## 2017-12-09 MED ORDER — ONDANSETRON HCL 4 MG PO TABS: 4.0000 mg | ORAL_TABLET | Freq: Four times a day (QID) | ORAL | Status: DC | PRN

## 2017-12-09 MED ORDER — SENNA 8.6 MG PO TABS
1.0000 | ORAL_TABLET | Freq: Two times a day (BID) | ORAL | Status: DC
  Administered 2017-12-09 – 2017-12-10 (×2): 8.6 mg via ORAL
  Filled 2017-12-09 (×2): qty 1

## 2017-12-09 MED ORDER — PANTOPRAZOLE SODIUM 40 MG PO TBEC
40.0000 mg | DELAYED_RELEASE_TABLET | Freq: Two times a day (BID) | ORAL | Status: DC
  Administered 2017-12-09 – 2017-12-10 (×2): 40 mg via ORAL
  Filled 2017-12-09 (×2): qty 1

## 2017-12-09 MED ORDER — PREDNISONE 10 MG PO TABS
5.0000 mg | ORAL_TABLET | Freq: Every day | ORAL | Status: DC
  Administered 2017-12-10: 09:00:00 5 mg via ORAL
  Filled 2017-12-09: qty 1

## 2017-12-09 MED ORDER — METOPROLOL TARTRATE 25 MG PO TABS
25.0000 mg | ORAL_TABLET | Freq: Two times a day (BID) | ORAL | Status: DC
  Administered 2017-12-09 – 2017-12-10 (×2): 25 mg via ORAL
  Filled 2017-12-09 (×2): qty 1

## 2017-12-09 NOTE — ED Triage Notes (Signed)
Patient presents to the ED with sudden confusion and hallucinations that began last night.  Patient is speaking in triage about living with her husband, a Architectural technologist and the christmas tree.  Patient states she is seeing things that other people are not seeing.  Patient's daughter reports symptoms started last night and patient does not have history of dementia.  Daughter reports earlier slurred speech that has since resolved.  Denies recent falls.

## 2017-12-09 NOTE — ED Provider Notes (Addendum)
[facility] Emergency Department Provider Note  ____________________________________________   I have reviewed the triage vital signs and the nursing notes. Where available I have reviewed prior notes and, if possible and indicated, outside hospital notes.    HISTORY  Chief Complaint Altered Mental Status    HPI [name] is a 84 y.o. female with a history of COPD DVT, pacemaker, arthritis and progressive dementia over the last several months.  To combat her progressive dementia symptoms, her PCP started on risperidone.  She took a total of 4 pills.  She did not take her dose today.  After taking a few of these pills, although not necessarily because she is taking a few of these pills, patient became last night confused and somewhat agitated.  And she began to have hallucinations during the course of the day.  She had formication, and she was also convinced that there is fluid coming out of the couch.  Family attest that they were not ants swarming the floor nor was there fluid coming out of couch.  In any event, the patient gradually over the last hour or so has started to revert back to normal at this time family thinks that she is about where she should be mentally.  However they are very concerned about this.  Patient did have a urinary sample assessed by PCP but the results not made  available yet patient is in no acute distress at this been no other change in medications and there is been no fall no head injury, the patient has had no focal complaints and no complaint of pain.  She states she has had slightly increased urination recently.   Past Medical History:  Diagnosis Date  . Arthritis   . Blood clot in vein   . Cancer (HCC)    squamous cell carcinoma  . COPD (chronic obstructive pulmonary disease) ([place])   . Diverticulitis   . DVT (deep vein thrombosis) in pregnancy ([place])   . GERD (gastroesophageal reflux disease)   . Myocardial infarction ([place])   .  Pacemaker   . PE (pulmonary thromboembolism) ([place])   . Scoliosis   . Stroke ([place])   . Thyroid disease   . TIA (transient ischemic attack)     Patient Active Problem List   Diagnosis Date Noted  . Chest pain 04/25/2017  . Chest pain, pleuritic 09/08/2015    Past Surgical History:  Procedure Laterality Date  . APPENDECTOMY    . BREAST SURGERY    . EYE SURGERY    . PACEMAKER IMPLANT    . TONSILLECTOMY      Prior to Admission medications   Medication Sig Start Date End Date Taking? Authorizing Provider  acetaminophen (TYLENOL) 325 MG tablet Take 650-1,000 mg by mouth every 6 (six) hours as needed.    [provider]  apixaban (ELIQUIS) 5 MG TABS tablet Take 2 tabs twice a day for 7 days and then 1 tab twice a day from 05/03/17 04/26/17   Fritzi Mandes, MD  aspirin EC 81 MG tablet Take 81 mg by mouth daily.    [provider]  diphenhydramine-acetaminophen (TYLENOL PM) 25-500 MG TABS tablet Take 1 tablet by mouth at bedtime as needed.    [provider]  furosemide (LASIX) 40 MG tablet Take 40 mg by mouth.    [provider]  levothyroxine (SYNTHROID, LEVOTHROID) 50 MCG tablet Take 50 mcg by mouth daily before breakfast. 30 minutes before breakfast    [provider]  loratadine (CLARITIN) 10 MG tablet Take 10 mg by mouth daily as needed for allergies.    [provider]  metoprolol tartrate (LOPRESSOR) 25 MG tablet Take 25 mg by mouth 2 (two) times daily.    [provider]  mometasone (NASONEX) 50 MCG/ACT nasal spray Place 2 sprays into the nose daily.    [provider]  nitroGLYCERIN (NITROSTAT) 0.4 MG SL tablet Place 0.4 mg under the tongue every 5 (five) minutes as needed for chest pain.    [provider]  omeprazole (PRILOSEC) 20 MG capsule Take 20 mg by mouth 2 (two) times daily before a meal. 30 minutes before meals    [provider]  polyethylene glycol (MIRALAX / GLYCOLAX) packet Take 17  g by mouth daily.    [provider]  potassium chloride (K-DUR,KLOR-CON) 10 MEQ tablet Take 10 mEq by mouth daily.    [provider]  predniSONE (DELTASONE) 5 MG tablet Take 5 mg by mouth daily with breakfast.    [provider]  senna (SENOKOT) 8.6 MG TABS tablet Take 1 tablet by mouth 2 (two) times daily as needed for mild constipation.     [provider]  traMADol (ULTRAM) 50 MG tablet Take by mouth 4 (four) times daily as needed for moderate pain or severe pain.    [provider]  Umeclidinium-Vilanterol (ANORO ELLIPTA) 62.5-25 MCG/INH AEPB Inhale 1 puff into the lungs daily.    [provider]  vitamin B-12 (CYANOCOBALAMIN) 1000 MCG tablet Take 1,000 mcg by mouth daily.    [provider]    Allergies Adhesive [tape]; Clindamycin/lincomycin; Codeine; Demerol [meperidine]; Erythromycin; Morphine and related; Penicillins; and Sulfa antibiotics  No family history on file.  Social History Social History   Tobacco Use  . Smoking status: Never Smoker  . Smokeless tobacco: Never Used  Substance Use Topics  . Alcohol use: No  . Drug use: No    Review of Systems Constitutional: No fever/chills Eyes: No visual changes. ENT: No sore throat. No stiff neck no neck pain Cardiovascular: Denies chest pain. Respiratory: Denies shortness of breath. Gastrointestinal:   no vomiting.  No diarrhea.  No constipation. Genitourinary: Negative for dysuria. Musculoskeletal: Negative lower extremity swelling Skin: Negative for rash. Neurological: Negative for severe headaches, focal weakness or numbness.   ____________________________________________   PHYSICAL EXAM:  VITAL SIGNS: ED Triage Vitals  Enc Vitals Group     BP 12/09/17 1809 (!) 133/59     Pulse Rate 12/09/17 1809 (!) 58     Resp 12/09/17 1809 18     Temp 12/09/17 1809 98.2 F (36.8 C)     Temp Source 12/09/17 1809 Oral     SpO2 12/09/17 1809 97 %     Weight  12/09/17 1810 135 lb (61.2 kg)     Height 12/09/17 1810 5\' 5"  (1.651 m)     Head Circumference --      Peak Flow --      Pain Score 12/09/17 1809 4     Pain Loc --      Pain Edu? --      Excl. in [place]? --     Constitutional: Alert and oriented to place and name knows that it is Friday knows that it is 2019,. Well appearing and in no acute distress. Eyes: Conjunctivae are normal Head: Atraumatic HEENT: No congestion/rhinnorhea. Mucous membranes are moist.  Oropharynx non-erythematous Neck:   Nontender with no meningismus, no masses, no stridor Cardiovascular:  Normal rate, regular rhythm. Grossly normal heart sounds.  Good peripheral circulation. Respiratory: Normal respiratory effort.  No retractions. Lungs CTAB. Abdominal: Soft and nontender. No distention. No guarding no rebound Back:  There is no focal tenderness or step off.  there is no midline tenderness there are no lesions noted. there is no CVA tenderness  Musculoskeletal: No lower extremity tenderness, no upper extremity tenderness. No joint effusions, no DVT signs strong distal pulses no edema Neurologic:  Normal speech and language. No gross focal neurologic deficits are appreciated.  Skin:  Skin is warm, dry and intact. No rash noted. Psychiatric: Mood and affect are normal. Speech and behavior are normal.  ____________________________________________   LABS (all labs ordered are listed, but only abnormal results are displayed)  Labs Reviewed  URINE CULTURE  CBC WITH DIFFERENTIAL/PLATELET  ETHANOL  URINALYSIS, COMPLETE (UACMP) WITH MICROSCOPIC  URINE DRUG SCREEN, QUALITATIVE ([place])  AMMONIA    Pertinent labs  results that were available during my care of the patient were reviewed by me and considered in my medical decision making (see chart for details). ____________________________________________  EKG  I personally interpreted any EKGs ordered by me or triage Sinus, rate 84 no acute ST elevation or  depression normal axis unremarkable EKG nonspecific ST changes ____________________________________________  RADIOLOGY  Pertinent labs & imaging results that were available during my care of the patient were reviewed by me and considered in my medical decision making (see chart for details). If possible, patient and/or family made aware of any abnormal findings.  No results found. ____________________________________________    PROCEDURES  Procedure(s) performed: None  Procedures  Critical Care performed: None  ____________________________________________   INITIAL IMPRESSION / ASSESSMENT AND PLAN / ED COURSE  Pertinent labs & imaging results that were available during my care of the patient were reviewed by me and considered in my medical decision making (see chart for details).  Here with hallucinations during the course of the last 24 hours, will obtain a CT scan of her head although low suspicion for CVA is certainly possible she had one, we will obtain basic blood work make sure that there is no electrolyte abnormality, urinalysis, chest x-ray all for the same reason, there is some reason to suspect the confusion can occur with risperidone and I think it is possible that this is a side effect of that although again we should rule out other entities before scrubbing into this medication.  Patient may require admission for further workup given the day of atypical confusion  ----------------------------------------- 8:41 PM on 12/09/2017 -----------------------------------------  Patient remains somewhat confused although in no acute distress, workup here is very reassuring.  However I am concerned about the patient's decompensation and certainly would not attribute this at this stage in a patient this age to purely psychiatric causes.  She is never done this before.  The hospitalist does agree we will admit to the hospitalist service     ____________________________________________   FINAL CLINICAL IMPRESSION(S) / ED DIAGNOSES  Final diagnoses:  None      This chart was dictated using voice recognition software.  Despite best efforts to proofread,  errors can occur which can change meaning.      Schuyler Amor, MD 12/09/17 1859    Schuyler Amor, MD 12/09/17 2042

## 2017-12-09 NOTE — ED Triage Notes (Signed)
FIRST NURSE NOTE--here for hallucinations since last night.  Seeing and hearing things per family.  NAD. Ambulatory into ED.  Placed in wheelchair. UA done by dr sparks, not resulted per family but able to see results in care everywhere.

## 2017-12-09 NOTE — ED Notes (Signed)
Pt asleep the whole time EKG was being performed. Pt given a pillow and warm blanket by this tech. No other needs voiced by pt's family at this time.

## 2017-12-09 NOTE — ED Notes (Signed)
Attempted to call report. RN on floor unavailable at this time.

## 2017-12-09 NOTE — H&P (Signed)
[facility] [place] NAME: [name]    MR#:  [id number]  DATE OF BIRTH:  Nov 05, 1922  DATE OF ADMISSION:  12/09/2017  PRIMARY CARE PHYSICIAN: Idelle Crouch, MD   REQUESTING/REFERRING PHYSICIAN: Burlene Arnt, MD  CHIEF COMPLAINT:   Chief Complaint  Patient presents with  . Altered Mental Status    HISTORY OF PRESENT ILLNESS:  [name]  is a 84 y.o. female who presents with 4 hours of visual hallucinations.  Patient's family states that she was started on Risperdal 3 days ago.  She took this medication for 2 days and started to develop visual hallucinations about 24 hours ago.  Her daughter did not give her any Risperdal today.  However, her hallucinations have persisted throughout the day.  Patient is aware they are hallucinations most of the time, and is easily reoriented otherwise.  Patient is found in the ED to have a little bit of acute on chronic renal failure as well.  Hospitalist were called for admission  PAST MEDICAL HISTORY:   Past Medical History:  Diagnosis Date  . Arthritis   . Blood clot in vein   . CAD (coronary artery disease)   . Cancer (HCC)    squamous cell carcinoma  . COPD (chronic obstructive pulmonary disease) ([place])   . Diverticulitis   . DVT (deep vein thrombosis) in pregnancy ([place])   . GERD (gastroesophageal reflux disease)   . Myocardial infarction ([place])   . Pacemaker   . PE (pulmonary thromboembolism) ([place])   . Scoliosis   . Stroke ([place])   . Thyroid disease   . TIA (transient ischemic attack)      PAST SURGICAL HISTORY:   Past Surgical History:  Procedure Laterality Date  . APPENDECTOMY    . BREAST SURGERY    . EYE SURGERY    . PACEMAKER IMPLANT    . TONSILLECTOMY       SOCIAL HISTORY:   Social History   Tobacco Use  . Smoking status: Never Smoker  . Smokeless tobacco: Never Used  Substance Use Topics  . Alcohol use: No     FAMILY HISTORY:   Family History  Problem  Relation Age of Onset  . Heart disease Mother   . Heart attack Mother      DRUG ALLERGIES:   Allergies  Allergen Reactions  . Adhesive [Tape] Itching    Reaction: itching  . Clindamycin/Lincomycin Other (See Comments)    Reaction: upset stomach   . Codeine Other (See Comments)  . Demerol [Meperidine] Nausea Only  . Erythromycin Other (See Comments)  . Morphine And Related     Reaction: unknown   . Other Nausea Only    Darvocet  . Penicillins Rash    Has patient had a PCN reaction causing immediate rash, facial/tongue/throat swelling, SOB or lightheadedness with hypotension: unknown Has patient had a PCN reaction causing severe rash involving mucus membranes or skin necrosis: unknown Has patient had a PCN reaction that required hospitalization: unknown  Has patient had a PCN reaction occurring within the last 10 years: unknown  If all of the above answers are "NO", then may proceed with Cephalosporin use.    . Sulfa Antibiotics Rash    MEDICATIONS AT HOME:   Prior to Admission medications   Medication Sig Start Date End Date Taking? Authorizing Provider  apixaban (ELIQUIS) 5 MG TABS tablet Take 2 tabs twice a day for 7 days and then 1 tab twice a  day from 05/03/17 Patient taking differently: Take 5 mg by mouth 2 (two) times daily.  04/26/17  Yes Fritzi Mandes, MD  aspirin EC 81 MG tablet Take 81 mg by mouth daily.   Yes [provider]  diphenhydramine-acetaminophen (TYLENOL PM) 25-500 MG TABS tablet Take 1 tablet by mouth at bedtime as needed.   Yes [provider]  fluticasone (FLONASE) 50 MCG/ACT nasal spray Place 1 spray into both nostrils daily as needed for allergies or rhinitis.   Yes [provider]  furosemide (LASIX) 40 MG tablet Take 40 mg by mouth.   Yes [provider]  levothyroxine (SYNTHROID, LEVOTHROID) 50 MCG tablet Take 50 mcg by mouth daily before breakfast. 30 minutes before breakfast   Yes [provider]   loratadine (CLARITIN) 10 MG tablet Take 10 mg by mouth daily as needed for allergies.   Yes [provider]  Menthol, Topical Analgesic, (ICY HOT ADVANCED RELIEF EX) Apply 1 application topically daily.   Yes [provider]  metoprolol tartrate (LOPRESSOR) 25 MG tablet Take 25 mg by mouth 2 (two) times daily.   Yes [provider]  omeprazole (PRILOSEC) 20 MG capsule Take 20 mg by mouth 2 (two) times daily before a meal. 30 minutes before meals   Yes [provider]  Polyethyl Glycol-Propyl Glycol (SYSTANE ULTRA OP) Apply 1 drop to eye daily as needed (Dry eye).   Yes [provider]  polyethylene glycol (MIRALAX / GLYCOLAX) packet Take 17 g by mouth daily as needed for mild constipation.    Yes [provider]  potassium chloride (K-DUR,KLOR-CON) 10 MEQ tablet Take 10 mEq by mouth daily.   Yes [provider]  predniSONE (DELTASONE) 5 MG tablet Take 5 mg by mouth daily with breakfast.   Yes [provider]  senna (SENOKOT) 8.6 MG TABS tablet Take 1 tablet by mouth 2 (two) times daily.    Yes [provider]  traMADol (ULTRAM) 50 MG tablet Take 50 mg by mouth 4 (four) times daily.    Yes [provider]  vitamin B-12 (CYANOCOBALAMIN) 1000 MCG tablet Take 1,000 mcg by mouth daily.   Yes [provider]    REVIEW OF SYSTEMS:  Review of Systems  Constitutional: Negative for chills, fever, malaise/fatigue and weight loss.  HENT: Negative for ear pain, hearing loss and tinnitus.   Eyes: Negative for blurred vision, double vision, pain and redness.  Respiratory: Negative for cough, hemoptysis and shortness of breath.   Cardiovascular: Negative for chest pain, palpitations, orthopnea and leg swelling.  Gastrointestinal: Negative for abdominal pain, constipation, diarrhea, nausea and vomiting.  Genitourinary: Negative for dysuria, frequency and hematuria.  Musculoskeletal: Negative for back pain, joint  pain and neck pain.  Skin:       No acne, rash, or lesions  Neurological: Negative for dizziness, tremors, focal weakness and weakness.       Hallucinations  Endo/Heme/Allergies: Negative for polydipsia. Does not bruise/bleed easily.  Psychiatric/Behavioral: Negative for depression. The patient is not nervous/anxious and does not have insomnia.      VITAL SIGNS:   Vitals:   12/09/17 1809 12/09/17 1810 12/09/17 1850 12/09/17 2030  BP: (!) 133/59  116/64 140/67  Pulse: (!) 58  (!) 59 71  Resp: 18  18 (!) 21  Temp: 98.2 F (36.8 C)     TempSrc: Oral     SpO2: 97%  97% 92%  Weight:  61.2 kg (135 lb)    Height:  5'  5" (1.651 m)     Wt Readings from Last 3 Encounters:  12/09/17 61.2 kg (135 lb)  04/25/17 61.4 kg (135 lb 4.8 oz)  09/08/15 55.7 kg (122 lb 11.2 oz)    PHYSICAL EXAMINATION:  Physical Exam  Vitals reviewed. Constitutional: She is oriented to person, place, and time. She appears well-developed and well-nourished. No distress.  HENT:  Head: Normocephalic and atraumatic.  Dry mucous membranes  Eyes: Pupils are equal, round, and reactive to light. Conjunctivae and EOM are normal. No scleral icterus.  Neck: Normal range of motion. Neck supple. No JVD present. No thyromegaly present.  Cardiovascular: Normal rate, regular rhythm and intact distal pulses. Exam reveals no gallop and no friction rub.  No murmur heard. Respiratory: Effort normal and breath sounds normal. No respiratory distress. She has no wheezes. She has no rales.  GI: Soft. Bowel sounds are normal. She exhibits no distension. There is no tenderness.  Musculoskeletal: Normal range of motion. She exhibits no edema.  No arthritis, no gout  Lymphadenopathy:    She has no cervical adenopathy.  Neurological: She is alert and oriented to person, place, and time. No cranial nerve deficit.  No dysarthria, no aphasia  Skin: Skin is warm and dry. No rash noted. No erythema.  Psychiatric: She has a normal mood and  affect. Her behavior is normal. Judgment and thought content normal.    LABORATORY PANEL:   CBC Recent Labs  Lab 12/09/17 1938  WBC 7.9  HGB 12.6  HCT 38.4  PLT 206   ------------------------------------------------------------------------------------------------------------------  Chemistries  Recent Labs  Lab 12/09/17 1938  NA 138  K 4.3  CL 100*  CO2 29  GLUCOSE 104*  BUN 20  CREATININE 1.37*  CALCIUM 9.5  AST 18  ALT 10*  ALKPHOS 91  BILITOT 0.8   ------------------------------------------------------------------------------------------------------------------  Cardiac Enzymes No results for input(s): TROPONINI in the last 168 hours. ------------------------------------------------------------------------------------------------------------------  RADIOLOGY:  Dg Chest 2 View  Result Date: 12/09/2017 CLINICAL DATA:  Confusion and hallucinations. EXAM: CHEST - 2 VIEW COMPARISON:  04/25/2017 FINDINGS: Stable right heart enlargement. Dual lead pacer. Atherosclerotic calcification of the aortic arch. Chronic scarring of the right lung base. Chronic mild interstitial accentuation at the lung bases. No pleural effusion or appreciable pneumonia. Bony demineralization. Degenerative glenohumeral arthropathy on the left. IMPRESSION: 1. No acute findings to account for the patient's current symptoms. 2. Stable right heart enlargement. 3.  Aortic Atherosclerosis (ICD10-I70.0). 4. Bony demineralization. Electronically Signed   By: Van Clines M.D.   On: 12/09/2017 19:35   Ct Head Wo Contrast  Result Date: 12/09/2017 CLINICAL DATA:  Sudden onset of confusion and hallucinations beginning last night. EXAM: CT HEAD WITHOUT CONTRAST TECHNIQUE: Contiguous axial images were obtained from the base of the skull through the vertex without intravenous contrast. COMPARISON:  08/11/2014 FINDINGS: Brain: Generalized atrophy. Chronic small-vessel ischemic changes of the hemispheric white  matter. Old infarctions in both thalami. No sign of acute infarction, mass lesion, hemorrhage, hydrocephalus or extra-axial collection. Vascular: There is atherosclerotic calcification of the major vessels at the base of the brain. Skull: Negative Sinuses/Orbits: Clear/normal Other: None IMPRESSION: No acute finding. No significant change since 2015. Generalized atrophy. Chronic small-vessel disease of the white matter. Old thalamic infarctions. Electronically Signed   By: Nelson Chimes M.D.   On: 12/09/2017 19:06    EKG:   Orders placed or performed during the hospital encounter of 12/09/17  . ED EKG  . ED EKG  . EKG 12-Lead  .  EKG 12-Lead    IMPRESSION AND PLAN:  Principal Problem:   Hallucinations -most likely due to initiation of Risperdal as an adverse side effect.  We will hold this medication and monitor her closely.  We will try not to use any other psychotropics tonight if possible, will instead try to reorient her as she seems to be fairly easily reoriented.  We will reassess in the morning.  If hallucinations persist she may require further workup. Active Problems:   Acute renal failure superimposed on stage 3 chronic kidney disease ([place]) -likely due to poor p.o. intake over the last 24-36 hours.  We will hydrate her with IV fluids tonight, avoid nephrotoxins and monitor for expected improvement   CAD (coronary artery disease) -continue home meds   Hypothyroidism -home dose thyroid replacement   GERD (gastroesophageal reflux disease) -home dose PPI  Chart review performed and case discussed with ED provider. Labs, imaging and/or ECG reviewed by provider and discussed with patient/family. Management plans discussed with the patient and/or family.  DVT PROPHYLAXIS: Systemic anticoagulation  GI PROPHYLAXIS: PPI  ADMISSION STATUS: Observation  CODE STATUS: Full Code Status History    Date Active Date Inactive Code Status Order ID Comments User Context   04/25/2017 0348  04/26/2017 1430 Full Code [id number]  Harvie Bridge, DO ED   09/08/2015 1733 09/09/2015 1747 Full Code [id number]  Fritzi Mandes, MD Inpatient      TOTAL TIME TAKING CARE OF THIS PATIENT: 40 minutes.   Mattheus Rauls [place] 12/09/2017, 9:01 PM  [employer]  [phone]  CC: Primary care physician; Idelle Crouch, MD  Note:  This document was prepared using Dragon voice recognition software and may include unintentional dictation errors.

## 2017-12-10 DIAGNOSIS — T43595A Adverse effect of other antipsychotics and neuroleptics, initial encounter: Secondary | ICD-10-CM | POA: Diagnosis not present

## 2017-12-10 LAB — BASIC METABOLIC PANEL
ANION GAP: 8 (ref 5–15)
BUN: 19 mg/dL (ref 6–20)
CALCIUM: 9 mg/dL (ref 8.9–10.3)
CO2: 27 mmol/L (ref 22–32)
Chloride: 106 mmol/L (ref 101–111)
Creatinine, Ser: 1.05 mg/dL — ABNORMAL HIGH (ref 0.44–1.00)
GFR calc Af Amer: 52 mL/min — ABNORMAL LOW (ref >60)
GFR calc non Af Amer: 45 mL/min — ABNORMAL LOW (ref >60)
GLUCOSE: 84 mg/dL (ref 65–99)
Potassium: 3.8 mmol/L (ref 3.5–5.1)
Sodium: 141 mmol/L (ref 135–145)

## 2017-12-10 LAB — CBC
HEMATOCRIT: 34.3 % — AB (ref 35.0–47.0)
HEMOGLOBIN: 11.4 g/dL — AB (ref 12.0–16.0)
MCH: 28.5 pg (ref 26.0–34.0)
MCHC: 33.4 g/dL (ref 32.0–36.0)
MCV: 85.3 fL (ref 80.0–100.0)
Platelets: 188 10*3/uL (ref 150–440)
RBC: 4.02 MIL/uL (ref 3.80–5.20)
RDW: 14.2 % (ref 11.5–14.5)
WBC: 6.8 10*3/uL (ref 3.6–11.0)

## 2017-12-10 LAB — MRSA PCR SCREENING: MRSA by PCR: NEGATIVE

## 2017-12-10 NOTE — Care Management (Signed)
Family declines any need for home health services.  Patient has 24 hour round the clock private caregivers. Was placed  in observation for hallucinations most likely to initiation of risperdal

## 2017-12-10 NOTE — Care Management Obs Status (Signed)
[facility] NOTIFICATION   Patient Details  Name: [name] MRN: [id number] Date of Birth: 15-Aug-1923   Medicare Observation Status Notification Given:  No  Discharge order placed in < 24hr of being placed on observation  Katrina Stack, RN 12/10/2017, 10:38 AM

## 2017-12-10 NOTE — Discharge Summary (Signed)
[facility] [place] NAME: [name]    MR#:  [id number]  DATE OF BIRTH:  December 19, 1922  DATE OF ADMISSION:  12/09/2017 ADMITTING PHYSICIAN: Lance Coon, MD  DATE OF DISCHARGE: 12/10/2017  PRIMARY CARE PHYSICIAN: Idelle Crouch, MD    ADMISSION DIAGNOSIS:  Altered mental status, unspecified altered mental status type [R41.82]  DISCHARGE DIAGNOSIS:  Principal Problem:   Hallucinations Active Problems:   CAD (coronary artery disease)   Hypothyroidism   Acute renal failure superimposed on stage 3 chronic kidney disease (HCC)   GERD (gastroesophageal reflux disease)   SECONDARY DIAGNOSIS:   Past Medical History:  Diagnosis Date  . Arthritis   . Blood clot in vein   . CAD (coronary artery disease)   . Cancer (HCC)    squamous cell carcinoma  . COPD (chronic obstructive pulmonary disease) ([place])   . Diverticulitis   . DVT (deep vein thrombosis) in pregnancy ([place])   . GERD (gastroesophageal reflux disease)   . Myocardial infarction ([place])   . Pacemaker   . PE (pulmonary thromboembolism) ([place])   . Scoliosis   . Stroke ([place])   . Thyroid disease   . TIA (transient ischemic attack)     HOSPITAL COURSE:   84 year old female with a history of COPD who presents with hallucinations.  1.  Hallucinations: This appears to be correlated with the timing of Risperdal.  This has been discontinued.  Patient no longer has hallucinations. I have also taken off of Tylenol PM for now.  She will discuss this with her PCP.  2.  Hypothyroidism: Continue Synthroid  3.  Essential hypertension: Continue metoprolol  4.  Arthritis: Continue prednisone and tramadol  5.  History of DVT: Continue Eliquis   DISCHARGE CONDITIONS AND DIET:   Stable for discharge on regular diet  CONSULTS OBTAINED:    DRUG ALLERGIES:   Allergies  Allergen Reactions  . Adhesive [Tape] Itching    Reaction: itching  . Clindamycin/Lincomycin Other (See Comments)     Reaction: upset stomach   . Codeine Other (See Comments)  . Demerol [Meperidine] Nausea Only  . Erythromycin Other (See Comments)  . Morphine And Related     Reaction: unknown   . Other Nausea Only    Darvocet  . Penicillins Rash    Has patient had a PCN reaction causing immediate rash, facial/tongue/throat swelling, SOB or lightheadedness with hypotension: unknown Has patient had a PCN reaction causing severe rash involving mucus membranes or skin necrosis: unknown Has patient had a PCN reaction that required hospitalization: unknown  Has patient had a PCN reaction occurring within the last 10 years: unknown  If all of the above answers are "NO", then may proceed with Cephalosporin use.    . Sulfa Antibiotics Rash    DISCHARGE MEDICATIONS:   Allergies as of 12/10/2017      Reactions   Adhesive [tape] Itching   Reaction: itching   Clindamycin/lincomycin Other (See Comments)   Reaction: upset stomach    Codeine Other (See Comments)   Demerol [meperidine] Nausea Only   Erythromycin Other (See Comments)   Morphine And Related    Reaction: unknown   Other Nausea Only   Darvocet   Penicillins Rash   Has patient had a PCN reaction causing immediate rash, facial/tongue/throat swelling, SOB or lightheadedness with hypotension: unknown Has patient had a PCN reaction causing severe rash involving mucus membranes or skin necrosis: unknown Has patient had a PCN reaction that required  hospitalization: unknown  Has patient had a PCN reaction occurring within the last 10 years: unknown  If all of the above answers are "NO", then may proceed with Cephalosporin use.   Sulfa Antibiotics Rash      Medication List    STOP taking these medications   diphenhydramine-acetaminophen 25-500 MG Tabs tablet Commonly known as:  TYLENOL PM     TAKE these medications   apixaban 5 MG Tabs tablet Commonly known as:  ELIQUIS Take 2 tabs twice a day for 7 days and then 1 tab twice a day from  05/03/17 What changed:    how much to take  how to take this  when to take this  additional instructions   aspirin EC 81 MG tablet Take 81 mg by mouth daily.   fluticasone 50 MCG/ACT nasal spray Commonly known as:  FLONASE Place 1 spray into both nostrils daily as needed for allergies or rhinitis.   furosemide 40 MG tablet Commonly known as:  LASIX Take 40 mg by mouth.   ICY HOT ADVANCED RELIEF EX Apply 1 application topically daily.   levothyroxine 50 MCG tablet Commonly known as:  SYNTHROID, LEVOTHROID Take 50 mcg by mouth daily before breakfast. 30 minutes before breakfast   loratadine 10 MG tablet Commonly known as:  CLARITIN Take 10 mg by mouth daily as needed for allergies.   metoprolol tartrate 25 MG tablet Commonly known as:  LOPRESSOR Take 25 mg by mouth 2 (two) times daily.   omeprazole 20 MG capsule Commonly known as:  PRILOSEC Take 20 mg by mouth 2 (two) times daily before a meal. 30 minutes before meals   polyethylene glycol packet Commonly known as:  MIRALAX / GLYCOLAX Take 17 g by mouth daily as needed for mild constipation.   potassium chloride 10 MEQ tablet Commonly known as:  K-DUR,KLOR-CON Take 10 mEq by mouth daily.   predniSONE 5 MG tablet Commonly known as:  DELTASONE Take 5 mg by mouth daily with breakfast.   senna 8.6 MG Tabs tablet Commonly known as:  SENOKOT Take 1 tablet by mouth 2 (two) times daily.   SYSTANE ULTRA OP Apply 1 drop to eye daily as needed (Dry eye).   traMADol 50 MG tablet Commonly known as:  ULTRAM Take 50 mg by mouth 4 (four) times daily.   vitamin B-12 1000 MCG tablet Commonly known as:  CYANOCOBALAMIN Take 1,000 mcg by mouth daily.         Today   CHIEF COMPLAINT:  Patient doing well no acute issues Only at bedside   VITAL SIGNS:  Blood pressure 137/62, pulse 61, temperature (!) 97.5 F (36.4 C), temperature source Oral, resp. rate 16, height 5\' 6"  (1.676 m), weight 62.4 kg (137 lb 9.6 oz),  SpO2 97 %.   REVIEW OF SYSTEMS:  Review of Systems  Constitutional: Negative.  Negative for chills, fever and malaise/fatigue.  HENT: Negative.  Negative for ear discharge, ear pain, hearing loss, nosebleeds and sore throat.   Eyes: Negative.  Negative for blurred vision and pain.  Respiratory: Negative.  Negative for cough, hemoptysis, shortness of breath and wheezing.   Cardiovascular: Negative.  Negative for chest pain, palpitations and leg swelling.  Gastrointestinal: Negative.  Negative for abdominal pain, blood in stool, diarrhea, nausea and vomiting.  Genitourinary: Negative.  Negative for dysuria.  Musculoskeletal: Negative.  Negative for back pain.  Skin: Negative.   Neurological: Negative for dizziness, tremors, speech change, focal weakness, seizures and headaches.  Endo/Heme/Allergies: Negative.  Does not bruise/bleed easily.  Psychiatric/Behavioral: Negative.  Negative for depression, hallucinations and suicidal ideas.     PHYSICAL EXAMINATION:  GENERAL:  84 y.o.-year-old patient lying in the bed with no acute distress.  NECK:  Supple, no jugular venous distention. No thyroid enlargement, no tenderness.  LUNGS: Normal breath sounds bilaterally, no wheezing, rales,rhonchi  No use of accessory muscles of respiration.  CARDIOVASCULAR: S1, S2 normal. No murmurs, rubs, or gallops.  ABDOMEN: Soft, non-tender, non-distended. Bowel sounds present. No organomegaly or mass.  EXTREMITIES: No pedal edema, cyanosis, or clubbing.  PSYCHIATRIC: The patient is alert and oriented x 3.  SKIN: No obvious rash, lesion, or ulcer.   DATA REVIEW:   CBC Recent Labs  Lab 12/10/17 0424  WBC 6.8  HGB 11.4*  HCT 34.3*  PLT 188    Chemistries  Recent Labs  Lab 12/09/17 1938 12/10/17 0424  NA 138 141  K 4.3 3.8  CL 100* 106  CO2 29 27  GLUCOSE 104* 84  BUN 20 19  CREATININE 1.37* 1.05*  CALCIUM 9.5 9.0  AST 18  --   ALT 10*  --   ALKPHOS 91  --   BILITOT 0.8  --      Cardiac Enzymes No results for input(s): TROPONINI in the last 168 hours.  Microbiology Results  @MICRORSLT48 @  RADIOLOGY:  Dg Chest 2 View  Result Date: 12/09/2017 CLINICAL DATA:  Confusion and hallucinations. EXAM: CHEST - 2 VIEW COMPARISON:  04/25/2017 FINDINGS: Stable right heart enlargement. Dual lead pacer. Atherosclerotic calcification of the aortic arch. Chronic scarring of the right lung base. Chronic mild interstitial accentuation at the lung bases. No pleural effusion or appreciable pneumonia. Bony demineralization. Degenerative glenohumeral arthropathy on the left. IMPRESSION: 1. No acute findings to account for the patient's current symptoms. 2. Stable right heart enlargement. 3.  Aortic Atherosclerosis (ICD10-I70.0). 4. Bony demineralization. Electronically Signed   By: Van Clines M.D.   On: 12/09/2017 19:35   Ct Head Wo Contrast  Result Date: 12/09/2017 CLINICAL DATA:  Sudden onset of confusion and hallucinations beginning last night. EXAM: CT HEAD WITHOUT CONTRAST TECHNIQUE: Contiguous axial images were obtained from the base of the skull through the vertex without intravenous contrast. COMPARISON:  08/11/2014 FINDINGS: Brain: Generalized atrophy. Chronic small-vessel ischemic changes of the hemispheric white matter. Old infarctions in both thalami. No sign of acute infarction, mass lesion, hemorrhage, hydrocephalus or extra-axial collection. Vascular: There is atherosclerotic calcification of the major vessels at the base of the brain. Skull: Negative Sinuses/Orbits: Clear/normal Other: None IMPRESSION: No acute finding. No significant change since 2015. Generalized atrophy. Chronic small-vessel disease of the white matter. Old thalamic infarctions. Electronically Signed   By: Nelson Chimes M.D.   On: 12/09/2017 19:06      Allergies as of 12/10/2017      Reactions   Adhesive [tape] Itching   Reaction: itching   Clindamycin/lincomycin Other (See Comments)   Reaction:  upset stomach    Codeine Other (See Comments)   Demerol [meperidine] Nausea Only   Erythromycin Other (See Comments)   Morphine And Related    Reaction: unknown   Other Nausea Only   Darvocet   Penicillins Rash   Has patient had a PCN reaction causing immediate rash, facial/tongue/throat swelling, SOB or lightheadedness with hypotension: unknown Has patient had a PCN reaction causing severe rash involving mucus membranes or skin necrosis: unknown Has patient had a PCN reaction that required hospitalization: unknown  Has patient had a PCN reaction occurring within  the last 10 years: unknown  If all of the above answers are "NO", then may proceed with Cephalosporin use.   Sulfa Antibiotics Rash      Medication List    STOP taking these medications   diphenhydramine-acetaminophen 25-500 MG Tabs tablet Commonly known as:  TYLENOL PM     TAKE these medications   apixaban 5 MG Tabs tablet Commonly known as:  ELIQUIS Take 2 tabs twice a day for 7 days and then 1 tab twice a day from 05/03/17 What changed:    how much to take  how to take this  when to take this  additional instructions   aspirin EC 81 MG tablet Take 81 mg by mouth daily.   fluticasone 50 MCG/ACT nasal spray Commonly known as:  FLONASE Place 1 spray into both nostrils daily as needed for allergies or rhinitis.   furosemide 40 MG tablet Commonly known as:  LASIX Take 40 mg by mouth.   ICY HOT ADVANCED RELIEF EX Apply 1 application topically daily.   levothyroxine 50 MCG tablet Commonly known as:  SYNTHROID, LEVOTHROID Take 50 mcg by mouth daily before breakfast. 30 minutes before breakfast   loratadine 10 MG tablet Commonly known as:  CLARITIN Take 10 mg by mouth daily as needed for allergies.   metoprolol tartrate 25 MG tablet Commonly known as:  LOPRESSOR Take 25 mg by mouth 2 (two) times daily.   omeprazole 20 MG capsule Commonly known as:  PRILOSEC Take 20 mg by mouth 2 (two) times daily  before a meal. 30 minutes before meals   polyethylene glycol packet Commonly known as:  MIRALAX / GLYCOLAX Take 17 g by mouth daily as needed for mild constipation.   potassium chloride 10 MEQ tablet Commonly known as:  K-DUR,KLOR-CON Take 10 mEq by mouth daily.   predniSONE 5 MG tablet Commonly known as:  DELTASONE Take 5 mg by mouth daily with breakfast.   senna 8.6 MG Tabs tablet Commonly known as:  SENOKOT Take 1 tablet by mouth 2 (two) times daily.   SYSTANE ULTRA OP Apply 1 drop to eye daily as needed (Dry eye).   traMADol 50 MG tablet Commonly known as:  ULTRAM Take 50 mg by mouth 4 (four) times daily.   vitamin B-12 1000 MCG tablet Commonly known as:  CYANOCOBALAMIN Take 1,000 mcg by mouth daily.         Management plans discussed with the patient and family and they are  in agreement. Stable for discharge home  Patient should follow up with pcp  CODE STATUS:     Code Status Orders  (From admission, onward)        Start     Ordered   12/09/17 2244  Full code  Continuous     12/09/17 2243    Code Status History    Date Active Date Inactive Code Status Order ID Comments User Context   04/25/2017 0348 04/26/2017 1430 Full Code [id number]  Harvie Bridge, DO ED   09/08/2015 1733 09/09/2015 1747 Full Code [id number]  Fritzi Mandes, MD Inpatient    Advance Directive Documentation     Most Recent Value  Type of Advance Directive  Healthcare Power of Attorney, Living will  Pre-existing out of facility DNR order (yellow form or pink MOST form)  -  "MOST" Form in Place?  -      TOTAL TIME TAKING CARE OF THIS PATIENT: 38 minutes.    Note: This dictation was prepared with Dragon dictation  along with smaller phrase technology. Any transcriptional errors that result from this process are unintentional.  Robyne Matar M.D on 12/10/2017 at 10:12 AM  Between 7am to 6pm - Pager - [phone] After 6pm go to [email or web address] - password [email or web address] [facility]  Office  [phone]  CC: Primary care physician; Idelle Crouch, MD

## 2017-12-10 NOTE — Progress Notes (Signed)
Family Meeting Note  Advance Directive:yes  Today a meeting took place with the Patient.and daughter   The following clinical team members were present during this meeting:MD  The following were discussed:Patient's diagnosis: hallucinations COPD  , Patient's progosis: Unable to determine and Goals for treatment: Full Code  Additional follow-up to be provided: no change to current advanced directives   Time spent during discussion:16 minuyes  Ann Cifelli, MD

## 2017-12-10 NOTE — Progress Notes (Signed)
Pt being discharged home, discharge instructions reviewed with pt and daughter, states understanding, pt with no complaints at discharge

## 2017-12-11 LAB — URINE CULTURE

## 2018-02-01 ENCOUNTER — Encounter: Payer: Self-pay | Admitting: Emergency Medicine

## 2018-02-01 ENCOUNTER — Emergency Department
Admission: EM | Admit: 2018-02-01 | Discharge: 2018-02-02 | Disposition: A | Discharge status: Home / Self Care | Payer: Medicare Other | Attending: Emergency Medicine | Admitting: Emergency Medicine

## 2018-02-01 ENCOUNTER — Other Ambulatory Visit: Payer: Self-pay

## 2018-02-01 ENCOUNTER — Emergency Department: Payer: Medicare Other

## 2018-02-01 DIAGNOSIS — I2510 Atherosclerotic heart disease of native coronary artery without angina pectoris: Secondary | ICD-10-CM | POA: Insufficient documentation

## 2018-02-01 DIAGNOSIS — Z950 Presence of cardiac pacemaker: Secondary | ICD-10-CM | POA: Diagnosis not present

## 2018-02-01 DIAGNOSIS — S51012A Laceration without foreign body of left elbow, initial encounter: Secondary | ICD-10-CM

## 2018-02-01 DIAGNOSIS — S51002A Unspecified open wound of left elbow, initial encounter: Secondary | ICD-10-CM | POA: Insufficient documentation

## 2018-02-01 DIAGNOSIS — Z79899 Other long term (current) drug therapy: Secondary | ICD-10-CM | POA: Insufficient documentation

## 2018-02-01 DIAGNOSIS — Y929 Unspecified place or not applicable: Secondary | ICD-10-CM | POA: Diagnosis not present

## 2018-02-01 DIAGNOSIS — S59902A Unspecified injury of left elbow, initial encounter: Secondary | ICD-10-CM | POA: Diagnosis present

## 2018-02-01 DIAGNOSIS — Z7982 Long term (current) use of aspirin: Secondary | ICD-10-CM | POA: Insufficient documentation

## 2018-02-01 DIAGNOSIS — J449 Chronic obstructive pulmonary disease, unspecified: Secondary | ICD-10-CM | POA: Insufficient documentation

## 2018-02-01 DIAGNOSIS — Y939 Activity, unspecified: Secondary | ICD-10-CM | POA: Diagnosis not present

## 2018-02-01 DIAGNOSIS — Y999 Unspecified external cause status: Secondary | ICD-10-CM | POA: Diagnosis not present

## 2018-02-01 DIAGNOSIS — W51XXXA Accidental striking against or bumped into by another person, initial encounter: Secondary | ICD-10-CM | POA: Insufficient documentation

## 2018-02-01 DIAGNOSIS — Z8673 Personal history of transient ischemic attack (TIA), and cerebral infarction without residual deficits: Secondary | ICD-10-CM | POA: Insufficient documentation

## 2018-02-01 DIAGNOSIS — E039 Hypothyroidism, unspecified: Secondary | ICD-10-CM | POA: Insufficient documentation

## 2018-02-01 NOTE — ED Triage Notes (Signed)
Patient is coming in for a skin tear to left forearm. Care giver though patient was falling and tried to help her not fall and just grabbing her arm tore her skin since skin is so fragile. Skin tear is most of forearm. Arm bandaged in triage.

## 2018-02-02 DIAGNOSIS — S51002A Unspecified open wound of left elbow, initial encounter: Secondary | ICD-10-CM | POA: Diagnosis not present

## 2018-02-02 MED ORDER — CEPHALEXIN 500 MG PO CAPS
500.0000 mg | ORAL_CAPSULE | Freq: Once | ORAL | Status: AC
  Administered 2018-02-02: 500 mg via ORAL
  Filled 2018-02-02: qty 1

## 2018-02-02 MED ORDER — LIDOCAINE-EPINEPHRINE-TETRACAINE (LET) SOLUTION
NASAL | Status: AC
  Administered 2018-02-02: 01:00:00 3 mL
  Filled 2018-02-02: qty 3

## 2018-02-02 MED ORDER — LIDOCAINE HCL (PF) 1 % IJ SOLN
INTRAMUSCULAR | Status: AC
  Filled 2018-02-02: qty 5

## 2018-02-02 MED ORDER — CEPHALEXIN 500 MG PO CAPS: 500.0000 mg | ORAL_CAPSULE | Freq: Two times a day (BID) | ORAL | 0 refills | Status: AC

## 2018-02-02 NOTE — ED Provider Notes (Signed)
[facility] Emergency Department Provider Note    First MD Initiated Contact with Patient 02/02/18 0109     (approximate)  I have reviewed the triage vital signs and the nursing notes.   HISTORY  Chief Complaint No chief complaint on file.    HPI [name] is a 84 y.o. female with below list of chronic medical conditions presents to the emergency department following accidental fall with resultant left forearm and tear.  Patient states that while falling her caregiver grabbed her arm to her from falling and she sustained a skin tear to that area.  Patient admits to very fragile skin with previous skin tears in the past.  Patient denies any head injury no loss of consciousness.   Past Medical History:  Diagnosis Date  . Arthritis   . Blood clot in vein   . CAD (coronary artery disease)   . Cancer (HCC)    squamous cell carcinoma  . COPD (chronic obstructive pulmonary disease) ([place])   . Diverticulitis   . DVT (deep vein thrombosis) in pregnancy ([place])   . GERD (gastroesophageal reflux disease)   . Myocardial infarction ([place])   . Pacemaker   . PE (pulmonary thromboembolism) ([place])   . Scoliosis   . Stroke ([place])   . Thyroid disease   . TIA (transient ischemic attack)     Patient Active Problem List   Diagnosis Date Noted  . Hallucinations 12/09/2017  . CAD (coronary artery disease) 12/09/2017  . Hypothyroidism 12/09/2017  . COPD (chronic obstructive pulmonary disease) ([place]) 12/09/2017  . Acute renal failure superimposed on stage 3 chronic kidney disease ([place]) 12/09/2017  . GERD (gastroesophageal reflux disease) 12/09/2017  . Chest pain 04/25/2017  . Chest pain, pleuritic 09/08/2015    Past Surgical History:  Procedure Laterality Date  . APPENDECTOMY    . BREAST SURGERY    . EYE SURGERY    . PACEMAKER IMPLANT    . TONSILLECTOMY      Prior to Admission medications   Medication Sig Start Date End Date Taking? Authorizing Provider    apixaban (ELIQUIS) 5 MG TABS tablet Take 2 tabs twice a day for 7 days and then 1 tab twice a day from 05/03/17 Patient taking differently: Take 5 mg by mouth 2 (two) times daily.  04/26/17   Fritzi Mandes, MD  aspirin EC 81 MG tablet Take 81 mg by mouth daily.    [provider]  cephALEXin (KEFLEX) 500 MG capsule Take 1 capsule (500 mg total) by mouth 2 (two) times daily for 10 days. 02/02/18 02/12/18  Gregor Hams, MD  fluticasone (FLONASE) 50 MCG/ACT nasal spray Place 1 spray into both nostrils daily as needed for allergies or rhinitis.    [provider]  furosemide (LASIX) 40 MG tablet Take 40 mg by mouth.    [provider]  levothyroxine (SYNTHROID, LEVOTHROID) 50 MCG tablet Take 50 mcg by mouth daily before breakfast. 30 minutes before breakfast    [provider]  loratadine (CLARITIN) 10 MG tablet Take 10 mg by mouth daily as needed for allergies.    [provider]  Menthol, Topical Analgesic, (ICY HOT ADVANCED RELIEF EX) Apply 1 application topically daily.    [provider]  metoprolol tartrate (LOPRESSOR) 25 MG tablet Take 25 mg by mouth 2 (two) times daily.    [provider]  omeprazole (PRILOSEC) 20 MG capsule Take 20 mg by mouth 2 (two) times daily before a meal. 30  minutes before meals    [provider]  Polyethyl Glycol-Propyl Glycol (SYSTANE ULTRA OP) Apply 1 drop to eye daily as needed (Dry eye).    [provider]  polyethylene glycol (MIRALAX / GLYCOLAX) packet Take 17 g by mouth daily as needed for mild constipation.     [provider]  potassium chloride (K-DUR,KLOR-CON) 10 MEQ tablet Take 10 mEq by mouth daily.    [provider]  predniSONE (DELTASONE) 5 MG tablet Take 5 mg by mouth daily with breakfast.    [provider]  senna (SENOKOT) 8.6 MG TABS tablet Take 1 tablet by mouth 2 (two) times daily.     [provider]  traMADol (ULTRAM) 50 MG tablet  Take 50 mg by mouth 4 (four) times daily.     [provider]  vitamin B-12 (CYANOCOBALAMIN) 1000 MCG tablet Take 1,000 mcg by mouth daily.    [provider]    Allergies Adhesive [tape]; Clindamycin/lincomycin; Codeine; Demerol [meperidine]; Erythromycin; Morphine and related; Other; Penicillins; and Sulfa antibiotics  Family History  Problem Relation Age of Onset  . Heart disease Mother   . Heart attack Mother     Social History Social History   Tobacco Use  . Smoking status: Never Smoker  . Smokeless tobacco: Never Used  Substance Use Topics  . Alcohol use: No  . Drug use: No    Review of Systems Constitutional: No fever/chills Eyes: No visual changes. ENT: No sore throat. Cardiovascular: Denies chest pain. Respiratory: Denies shortness of breath. Gastrointestinal: No abdominal pain.  No nausea, no vomiting.  No diarrhea.  No constipation. Genitourinary: Negative for dysuria. Musculoskeletal: Negative for neck pain.  Negative for back pain. Integumentary: Negative for rash.  Visit for left forearm skin tear Neurological: Negative for headaches, focal weakness or numbness.   ____________________________________________   PHYSICAL EXAM:  VITAL SIGNS: ED Triage Vitals  Enc Vitals Group     BP 02/01/18 2143 (!) 152/45     Pulse Rate 02/01/18 2143 65     Resp 02/01/18 2143 18     Temp 02/01/18 2143 98 F (36.7 C)     Temp Source 02/01/18 2143 Oral     SpO2 02/01/18 2143 99 %     Weight 02/01/18 2146 60.8 kg (134 lb)     Height 02/01/18 2146 1.676 m (5\' 6" )     Head Circumference --      Peak Flow --      Pain Score 02/01/18 2146 7     Pain Loc --      Pain Edu? --      Excl. in [place]? --     Constitutional: Alert and oriented. Well appearing and in no acute distress. Eyes: Conjunctivae are normal.  Head: Atraumatic. Nose: No congestion/rhinnorhea. Mouth/Throat: Mucous membranes are moist.  Oropharynx non-erythematous. Neck: No stridor.    Cardiovascular: Normal rate, regular rhythm. Good peripheral circulation. Grossly normal heart sounds. Respiratory: Normal respiratory effort.  No retractions. Lungs CTAB. Gastrointestinal: Soft and nontender. No distention.  Musculoskeletal: No lower extremity tenderness nor edema. No gross deformities of extremities. Neurologic:  Normal speech and language. No gross focal neurologic deficits are appreciated.  Skin: Skin tear noted to the volar aspect of the left forearm. Psychiatric: Mood and affect are normal. Speech and behavior are normal.   RADIOLOGY I, [place], personally viewed and evaluated these images (plain radiographs) as part of my medical decision making, as well as reviewing the written report  by the radiologist.  ED MD interpretation: No acute fracture or dislocation noted left wrist per radiologist  Official radiology report(s): Dg Wrist Complete Left  Result Date: 02/01/2018 CLINICAL DATA:  Skin tear at the left forearm, after fall. Left wrist pain. Initial encounter. EXAM: LEFT WRIST - COMPLETE 3+ VIEW COMPARISON:  Left wrist radiograph performed 08/11/2014 FINDINGS: There is no evidence of fracture or dislocation. Degenerative change is noted at the proximal carpal rows, with calcification of the triangular fibrocartilage. Degenerative change is noted at the first carpometacarpal joint, with underlying mild subluxation. Soft tissue swelling is noted about the wrist. The known soft tissue laceration is partially characterized at the distal forearm. IMPRESSION: No evidence of fracture or dislocation. Electronically Signed   By: Garald Balding M.D.   On: 02/01/2018 22:37     Procedures   ____________________________________________   INITIAL IMPRESSION / ASSESSMENT AND PLAN / ED COURSE  As part of my medical decision making, I reviewed the following data within the electronic MEDICAL RECORD NUMBER   84 year old female presented with above-stated history and  physical exam of left forearm skin tear following above-stated history.  Steri-Strips bite to the wound with excellent reapproximation skin tear dressing applied.  Patient given Keflex prophylactically given large surface area of the wound.  ______________________________  FINAL CLINICAL IMPRESSION(S) / ED DIAGNOSES  Final diagnoses:  Skin tear of left elbow without complication, initial encounter     MEDICATIONS GIVEN DURING THIS VISIT:  Medications  lidocaine-EPINEPHrine-tetracaine (LET) solution (3 mLs  Given 02/02/18 0121)  lidocaine-EPINEPHrine-tetracaine (LET) solution (3 mLs  Given 02/02/18 0121)  cephALEXin (KEFLEX) capsule 500 mg (500 mg Oral Given 02/02/18 0154)     ED Discharge Orders        Ordered    cephALEXin (KEFLEX) 500 MG capsule  2 times daily     02/02/18 0155       Note:  This document was prepared using Dragon voice recognition software and may include unintentional dictation errors.    Gregor Hams, MD 02/02/18 0200

## 2018-02-02 NOTE — ED Notes (Signed)
Pt's wound was cleaned, sterile stripped and bandaged.

## 2018-02-03 ENCOUNTER — Encounter: Payer: Medicare Other | Attending: Physician Assistant | Admitting: Physician Assistant

## 2018-02-03 DIAGNOSIS — X58XXXA Exposure to other specified factors, initial encounter: Secondary | ICD-10-CM | POA: Insufficient documentation

## 2018-02-03 DIAGNOSIS — J449 Chronic obstructive pulmonary disease, unspecified: Secondary | ICD-10-CM | POA: Insufficient documentation

## 2018-02-03 DIAGNOSIS — Z7902 Long term (current) use of antithrombotics/antiplatelets: Secondary | ICD-10-CM | POA: Insufficient documentation

## 2018-02-03 DIAGNOSIS — I4891 Unspecified atrial fibrillation: Secondary | ICD-10-CM | POA: Insufficient documentation

## 2018-02-03 DIAGNOSIS — Z7901 Long term (current) use of anticoagulants: Secondary | ICD-10-CM | POA: Insufficient documentation

## 2018-02-03 DIAGNOSIS — Z8673 Personal history of transient ischemic attack (TIA), and cerebral infarction without residual deficits: Secondary | ICD-10-CM | POA: Insufficient documentation

## 2018-02-03 DIAGNOSIS — Z950 Presence of cardiac pacemaker: Secondary | ICD-10-CM | POA: Insufficient documentation

## 2018-02-03 DIAGNOSIS — Z85828 Personal history of other malignant neoplasm of skin: Secondary | ICD-10-CM | POA: Insufficient documentation

## 2018-02-03 DIAGNOSIS — I872 Venous insufficiency (chronic) (peripheral): Secondary | ICD-10-CM | POA: Insufficient documentation

## 2018-02-03 DIAGNOSIS — S51812A Laceration without foreign body of left forearm, initial encounter: Secondary | ICD-10-CM | POA: Diagnosis not present

## 2018-02-03 DIAGNOSIS — I2510 Atherosclerotic heart disease of native coronary artery without angina pectoris: Secondary | ICD-10-CM | POA: Diagnosis not present

## 2018-02-03 DIAGNOSIS — Z86711 Personal history of pulmonary embolism: Secondary | ICD-10-CM | POA: Diagnosis not present

## 2018-02-03 DIAGNOSIS — L97222 Non-pressure chronic ulcer of left calf with fat layer exposed: Secondary | ICD-10-CM | POA: Insufficient documentation

## 2018-02-03 DIAGNOSIS — I87312 Chronic venous hypertension (idiopathic) with ulcer of left lower extremity: Secondary | ICD-10-CM | POA: Insufficient documentation

## 2018-02-03 DIAGNOSIS — I252 Old myocardial infarction: Secondary | ICD-10-CM | POA: Insufficient documentation

## 2018-02-03 DIAGNOSIS — Z86718 Personal history of other venous thrombosis and embolism: Secondary | ICD-10-CM | POA: Insufficient documentation

## 2018-02-03 DIAGNOSIS — I10 Essential (primary) hypertension: Secondary | ICD-10-CM | POA: Diagnosis not present

## 2018-02-05 NOTE — Progress Notes (Signed)
CHANDEL, ZAUN ([id number]) Visit Report for 02/03/2018 Abuse/Suicide Risk Screen Details Patient Name: [name], [name] Date of Service: 02/03/2018 12:30 PM Medical Record Number: [id number] Patient Account Number: [id number] Date of Birth/Sex: 12/23/1922 (84 y.o. F) Treating RN: Cornell Barman Primary Care Elanna Bert: Fulton Reek Other Clinician: Referring Karey Suthers: Marjean Donna Treating Amalia Edgecombe/Extender: Melburn Hake, HOYT Weeks in Treatment: 0 Abuse/Suicide Risk Screen Items Answer ABUSE/SUICIDE RISK SCREEN: Has anyone close to you tried to hurt or harm you recentlyo No Do you feel uncomfortable with anyone in your familyo No Has anyone forced you do things that you didnot want to doo No Do you have any thoughts of harming yourselfo No Patient displays signs or symptoms of abuse and/or neglect. No Electronic Signature(s) Signed: 02/03/2018 5:30:19 PM By: Gretta Cool, BSN, RN, CWS, Kim RN, BSN Entered By: Gretta Cool, BSN, RN, CWS, Kim on 02/03/2018 12:59:55 Freer, Charlyne Quale ([id number]) -------------------------------------------------------------------------------- Activities of Daily Living Details Patient Name: [name] Date of Service: 02/03/2018 12:30 PM Medical Record Number: [id number] Patient Account Number: [id number] Date of Birth/Sex: [birth year] (84 y.o. F) Treating RN: Cornell Barman Primary Care Aleiya Rye: Fulton Reek Other Clinician: Referring Naviah Belfield: Marjean Donna Treating Jayli Fogleman/Extender: Melburn Hake, HOYT Weeks in Treatment: 0 Activities of Daily Living Items Answer Activities of Daily Living (Please select one for each item) Drive Automobile Not Able Take Medications Need Assistance Use Telephone Need Assistance Care for Appearance Need Assistance Use Toilet Need Assistance Bath / Shower Need Assistance Dress Self Need Assistance Feed Self Need Assistance Walk Need Assistance Get In / Out Bed Need Assistance Housework Need Assistance Prepare Meals Need  Assistance Handle Money Need Assistance Shop for Self Need Assistance Electronic Signature(s) Signed: 02/03/2018 5:30:19 PM By: Gretta Cool, BSN, RN, CWS, Kim RN, BSN Entered By: Gretta Cool, BSN, RN, CWS, Kim on 02/03/2018 13:00:12 JOELYS, STAUBS ([id number]) -------------------------------------------------------------------------------- Education Assessment Details Patient Name: [name] Date of Service: 02/03/2018 12:30 PM Medical Record Number: [id number] Patient Account Number: [id number] Date of Birth/Sex: 04/18/1923 (84 y.o. F) Treating RN: Cornell Barman Primary Care Yovani Cogburn: Fulton Reek Other Clinician: Referring Aristea Posada: Marjean Donna Treating Argusta Mcgann/Extender: Melburn Hake, HOYT Weeks in Treatment: 0 Primary Learner Assessed: Caregiver Reason Patient is not Primary Learner: need assistance Learning Preferences/Education Level/Primary Language Learning Preference: Explanation, Demonstration Highest Education Level: High School Preferred Language: English Cognitive Barrier Assessment/Beliefs Language Barrier: No Translator Needed: No Memory Deficit: No Emotional Barrier: No Cultural/Religious Beliefs Affecting Medical Care: No Physical Barrier Assessment Impaired Vision: Yes Glasses Impaired Hearing: Yes Decreased Hand dexterity: No Knowledge/Comprehension Assessment Knowledge Level: High Comprehension Level: High Ability to understand written High instructions: Ability to understand verbal High instructions: Motivation Assessment Anxiety Level: Calm Cooperation: Cooperative Education Importance: Acknowledges Need Interest in Health Problems: Asks Questions Perception: Coherent Willingness to Engage in Self- High Management Activities: Readiness to Engage in Self- High Management Activities: Electronic Signature(s) Signed: 02/03/2018 5:30:19 PM By: Gretta Cool, BSN, RN, CWS, Kim RN, BSN Entered By: Gretta Cool, BSN, RN, CWS, Kim on 02/03/2018 13:00:57 HOLLEE, FATE  ([id number]) -------------------------------------------------------------------------------- Fall Risk Assessment Details Patient Name: [name] Date of Service: 02/03/2018 12:30 PM Medical Record Number: [id number] Patient Account Number: [id number] Date of Birth/Sex: 07-28-1923 (84 y.o. F) Treating RN: Cornell Barman Primary Care Bentley Fissel: Fulton Reek Other Clinician: Referring Gelisa Tieken: Marjean Donna Treating Barnett Elzey/Extender: Melburn Hake, HOYT Weeks in Treatment: 0 Fall Risk Assessment Items Have you had 2 or more falls in the last 12 monthso 0 No Have you had any fall that resulted in injury in the last  12 monthso 0 No FALL RISK ASSESSMENT: History of falling - immediate or within 3 months 0 No Secondary diagnosis 0 No Ambulatory aid None/bed rest/wheelchair/nurse 0 No Crutches/cane/walker 15 Yes Furniture 0 No IV Access/Saline Lock 0 No Gait/Training Normal/bed rest/immobile 0 No Weak 10 Yes Impaired 0 No Mental Status Oriented to own ability 0 No Electronic Signature(s) Signed: 02/03/2018 5:30:19 PM By: Gretta Cool, BSN, RN, CWS, Kim RN, BSN Entered By: Gretta Cool, BSN, RN, CWS, Kim on 02/03/2018 13:04:28 RODNISHA, BLOMGREN ([id number]) -------------------------------------------------------------------------------- Foot Assessment Details Patient Name: [name] Date of Service: 02/03/2018 12:30 PM Medical Record Number: [id number] Patient Account Number: [id number] Date of Birth/Sex: 14-Oct-1922 (84 y.o. F) Treating RN: Cornell Barman Primary Care Shila Kruczek: Fulton Reek Other Clinician: Referring Samaj Wessells: Marjean Donna Treating Geneive Sandstrom/Extender: Melburn Hake, HOYT Weeks in Treatment: 0 Foot Assessment Items Site Locations + = Sensation present, - = Sensation absent, C = Callus, U = Ulcer R = Redness, W = Warmth, M = Maceration, PU = Pre-ulcerative lesion F = Fissure, S = Swelling, D = Dryness Assessment Right: Left: Other Deformity: No No Prior Foot Ulcer: No  No Prior Amputation: No No Charcot Joint: No No Ambulatory Status: Ambulatory With Help Assistance Device: Walker GaitEnergy manager) Signed: 02/03/2018 5:30:19 PM By: Gretta Cool, BSN, RN, CWS, Kim RN, BSN Entered By: Gretta Cool, BSN, RN, CWS, Kim on 02/03/2018 13:05:04 Latimore, Charlyne Quale ([id number]) -------------------------------------------------------------------------------- Nutrition Risk Assessment Details Patient Name: [name] Date of Service: 02/03/2018 12:30 PM Medical Record Number: [id number] Patient Account Number: [id number] Date of Birth/Sex: 1922/12/20 (84 y.o. F) Treating RN: Cornell Barman Primary Care Iran Kievit: Fulton Reek Other Clinician: Referring Aveon Colquhoun: Marjean Donna Treating Mayre Bury/Extender: Melburn Hake, HOYT Weeks in Treatment: 0 Height (in): 66 Weight (lbs): 134 Body Mass Index (BMI): 21.6 Nutrition Risk Assessment Items NUTRITION RISK SCREEN: I have an illness or condition that made me change the kind and/or amount of 0 No food I eat I eat fewer than two meals per day 0 No I eat few fruits and vegetables, or milk products 0 No I have three or more drinks of beer, liquor or wine almost every day 0 No I have tooth or mouth problems that make it hard for me to eat 0 No I don't always have enough money to buy the food I need 0 No I eat alone most of the time 0 No I take three or more different prescribed or over-the-counter drugs a day 1 Yes Without wanting to, I have lost or gained 10 pounds in the last six months 0 No I am not always physically able to shop, cook and/or feed myself 0 No Nutrition Protocols Good Risk Protocol 0 No interventions needed Moderate Risk Protocol Electronic Signature(s) Signed: 02/03/2018 5:30:19 PM By: Gretta Cool, BSN, RN, CWS, Kim RN, BSN Entered By: Gretta Cool, BSN, RN, CWS, Kim on 02/03/2018 13:04:45

## 2018-02-05 NOTE — Progress Notes (Signed)
KENZA, MUNAR ([id number]) Visit Report for 02/03/2018 Allergy List Details Patient Name: [name], [name] Date of Service: 02/03/2018 12:30 PM Medical Record Number: [id number] Patient Account Number: [id number] Date of Birth/Sex: Nov 11, 1922 (84 y.o. F) Treating RN: Cornell Barman Primary Care Eilam Shrews[place]: Fulton Reek Other Clinician: Referring Zakari Couchman: Marjean Donna Treating Crytal Pensinger/Extender: Melburn Hake, HOYT Weeks in Treatment: 0 Allergies Active Allergies Erythromycin Penicillin Reaction: rash Sulfa Drugs Reaction: rash clindamycin Reaction: upset stomach adhesive Reaction: itching codiene Darvocet-N Reaction: nausea Demerol Allergy Notes Electronic Signature(s) Signed: 02/03/2018 5:30:19 PM By: Gretta Cool, BSN, RN, CWS, Kim RN, BSN Entered By: Gretta Cool, BSN, RN, CWS, Kim on 02/03/2018 12:52:14 [name], [name] -------------------------------------------------------------------------------- Arrival Information Details Patient Name: [name] Date of Service: 02/03/2018 12:30 PM Medical Record Number: [id number] Patient Account Number: [id number] Date of Birth/Sex: [birth year] (84 y.o. F) Treating RN: Cornell Barman Primary Care Keeon Zurn: Fulton Reek Other Clinician: Referring Cyenna Rebello: Marjean Donna Treating Elzie Knisley/Extender: Melburn Hake, HOYT Weeks in Treatment: 0 Visit Information Patient Arrived: Walker Arrival Time: 12:48 Accompanied By: daughters Transfer Assistance: None Patient Identification Verified: Yes Secondary Verification Process Yes Completed: Patient Has Alerts: Yes Patient Alerts: Patient on Blood Thinner Eliquis History Since Last Visit Had a fall or experienced change in activities of daily living that may affect risk of falls: Yes Electronic Signature(s) Signed: 02/03/2018 5:45:37 PM By: Montey Hora Entered By: Montey Hora on 02/03/2018 13:36:49 [name], [name] -------------------------------------------------------------------------------- Clinic Level of Care Assessment Details Patient Name: [name] Date of Service: 02/03/2018 12:30 PM Medical Record Number: [id number] Patient Account Number: [id number] Date of Birth/Sex: Jul 18, 1923 (84 y.o. F) Treating RN: Montey Hora Primary Care Josedejesus Marcum: Fulton Reek Other Clinician: Referring Netha Dafoe: Marjean Donna Treating Emri Sample/Extender: Melburn Hake, HOYT Weeks in Treatment: 0 Clinic Level of Care Assessment Items TOOL 2 Quantity Score []  - Use when only an EandM is performed on the INITIAL visit 0 ASSESSMENTS - Nursing Assessment / Reassessment X - General Physical Exam (combine w/ comprehensive assessment (listed just below) when 1 20 performed on new pt. evals) X- 1 25 Comprehensive Assessment (HX, ROS, Risk Assessments, Wounds Hx, etc.) ASSESSMENTS - Wound and Skin Assessment / Reassessment []  - Simple Wound Assessment / Reassessment - one wound 0 X- 2 5 Complex Wound Assessment / Reassessment - multiple wounds []  - 0 Dermatologic / Skin Assessment (not related to wound area) ASSESSMENTS - Ostomy and/or Continence Assessment and Care []  - Incontinence Assessment and Management 0 []  - 0 Ostomy Care Assessment and Management (repouching, etc.) PROCESS - Coordination of Care X - Simple Patient / Family Education for ongoing care 1 15 []  - 0 Complex (extensive) Patient / Family Education for ongoing care X- 1 10 Staff obtains Programmer, systems, Records, Test Results / Process Orders []  - 0 Staff telephones HHA, Nursing Homes / Clarify orders / etc []  - 0 Routine Transfer to another Facility (non-emergent condition) []  - 0 Routine Hospital Admission (non-emergent condition) X- 1 15 New Admissions / Biomedical engineer / Ordering NPWT, Apligraf, etc. []  - 0 Emergency Hospital Admission (emergent condition) []  - 0 Simple Discharge Coordination X- 1 15 Complex  (extensive) Discharge Coordination PROCESS - Special Needs []  - Pediatric / Minor Patient Management 0 []  - 0 Isolation Patient Management [name], Ann E. ([id number]) []  - 0 Hearing / Language / Visual special needs []  - 0 Assessment of Community assistance (transportation, D/C planning, etc.) []  - 0 Additional assistance / Altered mentation []  - 0 Support Surface(s) Assessment (bed, cushion, seat, etc.) INTERVENTIONS -  Wound Cleansing / Measurement X - Wound Imaging (photographs - any number of wounds) 1 5 []  - 0 Wound Tracing (instead of photographs) []  - 0 Simple Wound Measurement - one wound X- 2 5 Complex Wound Measurement - multiple wounds []  - 0 Simple Wound Cleansing - one wound X- 2 5 Complex Wound Cleansing - multiple wounds INTERVENTIONS - Wound Dressings X - Small Wound Dressing one or multiple wounds 1 10 []  - 0 Medium Wound Dressing one or multiple wounds X- 1 20 Large Wound Dressing one or multiple wounds []  - 0 Application of Medications - injection INTERVENTIONS - Miscellaneous []  - External ear exam 0 []  - 0 Specimen Collection (cultures, biopsies, blood, body fluids, etc.) []  - 0 Specimen(s) / Culture(s) sent or taken to Lab for analysis []  - 0 Patient Transfer (multiple staff / Civil Service fast streamer / Similar devices) []  - 0 Simple Staple / Suture removal (25 or less) []  - 0 Complex Staple / Suture removal (26 or more) []  - 0 Hypo / Hyperglycemic Management (close monitor of Blood Glucose) X- 1 15 Ankle / Brachial Index (ABI) - do not check if billed separately Has the patient been seen at the hospital within the last three years: Yes Total Score: 180 Level Of Care: New/Established - Level 5 Electronic Signature(s) Signed: 02/03/2018 5:45:37 PM By: Montey Hora Entered By: Montey Hora on 02/03/2018 13:56:09 [name], [name] -------------------------------------------------------------------------------- Encounter Discharge Information  Details Patient Name: [name] Date of Service: 02/03/2018 12:30 PM Medical Record Number: [id number] Patient Account Number: [id number] Date of Birth/Sex: [birth year] (84 y.o. F) Treating RN: Ahmed Prima Primary Care Secilia Apps: Fulton Reek Other Clinician: Referring Desha Bitner: Marjean Donna Treating Alaysha Jefcoat/Extender: Melburn Hake, HOYT Weeks in Treatment: 0 Encounter Discharge Information Items Discharge Condition: Stable Ambulatory Status: Walker Discharge Destination: Home Transportation: Private Auto Accompanied By: daughters Schedule Follow-up Appointment: Yes Clinical Summary of Care: Electronic Signature(s) Signed: 02/03/2018 2:44:51 PM By: Alric Quan Entered By: Alric Quan on 02/03/2018 14:44:50 [place], [name] ([id number]) -------------------------------------------------------------------------------- Lower Extremity Assessment Details Patient Name: [name] Date of Service: 02/03/2018 12:30 PM Medical Record Number: [id number] Patient Account Number: [id number] Date of Birth/Sex: 1923/07/29 (84 y.o. F) Treating RN: Cornell Barman Primary Care Ciaira Natividad: Fulton Reek Other Clinician: Referring Haden Cavenaugh: Marjean Donna Treating Andres Bantz/Extender: Melburn Hake, HOYT Weeks in Treatment: 0 Edema Assessment Assessed: [Left: No] [Right: No] [Left: Edema] [Right: :] Calf Left: Right: Point of Measurement: 30 cm From Medial Instep 34.5 cm 33.4 cm Ankle Left: Right: Point of Measurement: 10 cm From Medial Instep 25.5 cm 22.5 cm Vascular Assessment Pulses: Posterior Tibial Blood Pressure: Brachial: [Left:136] Dorsalis Pedis: 160 [Left:Dorsalis Pedis: 150] Ankle: Posterior Tibial: [Left:Posterior Tibial: 1.18] [Right:1.10] Toe Nail Assessment Left: Right: Thick: No No Discolored: No No Deformed: No No Improper Length and Hygiene: No No Electronic Signature(s) Signed: 02/03/2018 5:30:19 PM By: Gretta Cool, BSN, RN, CWS, Kim RN, BSN Entered By:  Gretta Cool, BSN, RN, CWS, Kim on 02/03/2018 13:22:43 Clock, [name] ([id number]) -------------------------------------------------------------------------------- Multi Wound Chart Details Patient Name: [name] Date of Service: 02/03/2018 12:30 PM Medical Record Number: [id number] Patient Account Number: [id number] Date of Birth/Sex: 17-Nov-1922 (84 y.o. F) Treating RN: Montey Hora Primary Care Enoch Moffa: Fulton Reek Other Clinician: Referring Miken Stecher: Marjean Donna Treating Chayce Robbins/Extender: Melburn Hake, HOYT Weeks in Treatment: 0 Vital Signs Height(in): 66 Pulse(bpm): 78 Weight(lbs): 134 Blood Pressure(mmHg): 110/78 Body Mass Index(BMI): 22 Temperature(F): 98.4 Respiratory Rate 16 (breaths/min): Photos: [2:No Photos] [3:No Photos] [N/A:N/A] Wound Location: [2:Left Forearm] [3:Left Lower  Leg - Posterior] [N/A:N/A] Wounding Event: [2:Trauma] [3:Gradually Appeared] [N/A:N/A] Primary Etiology: [2:Trauma, Other] [3:Venous Leg Ulcer] [N/A:N/A] Comorbid History: [2:Cataracts, Chronic Obstructive Cataracts, Chronic Obstructive N/A Pulmonary Disease (COPD), Pulmonary Disease (COPD), Deep Vein Thrombosis, Hypertension, Myocardial Infarction, Osteoarthritis] [3:Deep Vein Thrombosis, Hypertension,  Myocardial Infarction, Osteoarthritis] Date Acquired: [2:02/01/2018] [3:12/05/2017] [N/A:N/A] Weeks of Treatment: [2:0] [3:0] [N/A:N/A] Wound Status: [2:Open] [3:Open] [N/A:N/A] Measurements L x W x D [2:13x0.7x0.1] [3:2.5x2x0.2] [N/A:N/A] (cm) Area (cm) : [2:7.147] [3:3.927] [N/A:N/A] Volume (cm) : [8:6.578] [3:0.785] [N/A:N/A] % Reduction in Area: [2:N/A] [3:0.00%] [N/A:N/A] % Reduction in Volume: [2:N/A] [3:0.00%] [N/A:N/A] Classification: [2:Partial Thickness] [3:Full Thickness Without Exposed Support Structures] [N/A:N/A] Exudate Amount: [2:Medium] [3:Large] [N/A:N/A] Exudate Type: [2:Sanguinous] [3:Serous] [N/A:N/A] Exudate Color: [2:red] [3:amber] [N/A:N/A] Wound Margin:  [2:Indistinct, nonvisible] [3:Indistinct, nonvisible] [N/A:N/A] Granulation Amount: [2:None Present (0%)] [3:Small (1-33%)] [N/A:N/A] Granulation Quality: [2:N/A] [3:Pink] [N/A:N/A] Necrotic Amount: [2:Large (67-100%)] [3:Large (67-100%)] [N/A:N/A] Necrotic Tissue: [2:Eschar] [3:Adherent Slough] [N/A:N/A] Exposed Structures: [2:Fascia: No Fat Layer (Subcutaneous Tissue) Exposed: No Tendon: No Muscle: No Joint: No Bone: No Limited to Skin Breakdown] [3:Fat Layer (Subcutaneous Tissue) Exposed: Yes Fascia: No Tendon: No Muscle: No Joint: No Bone: No] [N/A:N/A] Epithelialization: [2:Large (67-100%)] [3:Small (1-33%)] [N/A:N/A] Periwound Skin Texture: Excoriation: No Excoriation: No N/A Induration: No Induration: No Callus: No Callus: No Crepitus: No Crepitus: No Rash: No Rash: No Scarring: No Scarring: No Periwound Skin Moisture: Maceration: No Maceration: Yes N/A Dry/Scaly: No Dry/Scaly: No Periwound Skin Color: Ecchymosis: Yes Atrophie Blanche: No N/A Atrophie Blanche: No Cyanosis: No Cyanosis: No Ecchymosis: No Erythema: No Erythema: No Hemosiderin Staining: No Hemosiderin Staining: No Mottled: No Mottled: No Pallor: No Pallor: No Rubor: No Rubor: No Tenderness on Palpation: No No N/A Wound Preparation: Ulcer Cleansing: Ulcer Cleansing: N/A Rinsed/Irrigated with Saline Rinsed/Irrigated with Saline Topical Anesthetic Applied: Topical Anesthetic Applied: None Other: lidociane 4% Treatment Notes Electronic Signature(s) Signed: 02/03/2018 5:45:37 PM By: Montey Hora Entered By: Montey Hora on 02/03/2018 13:39:58 [name], [name] ([id number]) -------------------------------------------------------------------------------- Multi-Disciplinary Care Plan Details Patient Name: [name] Date of Service: 02/03/2018 12:30 PM Medical Record Number: [id number] Patient Account Number: [id number] Date of Birth/Sex: Oct 23, 1922 (84 y.o. F) Treating RN: Montey Hora Primary Care Levern Kalka: Fulton Reek Other Clinician: Referring Samanvi Cuccia: Marjean Donna Treating Jaidence Geisler/Extender: Melburn Hake, HOYT Weeks in Treatment: 0 Active Inactive ` Abuse / Safety / Falls / Self Care Management Nursing Diagnoses: History of Falls Goals: Patient will remain injury free related to falls Date Initiated: 02/03/2018 Target Resolution Date: 04/14/2018 Goal Status: Active Interventions: Assess fall risk on admission and as needed Notes: ` Orientation to the Wound Care Program Nursing Diagnoses: Knowledge deficit related to the wound healing center program Goals: Patient/caregiver will verbalize understanding of the [facility] Program Date Initiated: 02/03/2018 Target Resolution Date: 04/15/2018 Goal Status: Active Interventions: Provide education on orientation to the wound center Notes: ` Wound/Skin Impairment Nursing Diagnoses: Impaired tissue integrity Goals: Ulcer/skin breakdown will heal within 14 weeks Date Initiated: 02/03/2018 Target Resolution Date: 04/15/2018 Goal Status: Active Interventions: SERAH, NICOLETTI ([id number]) Assess patient/caregiver ability to obtain necessary supplies Assess patient/caregiver ability to perform ulcer/skin care regimen upon admission and as needed Assess ulceration(s) every visit Notes: Electronic Signature(s) Signed: 02/03/2018 5:45:37 PM By: Montey Hora Entered By: Montey Hora on 02/03/2018 13:39:45 [name], [name] ([id number]) -------------------------------------------------------------------------------- Pain Assessment Details Patient Name: [name] Date of Service: 02/03/2018 12:30 PM Medical Record Number: [id number] Patient Account Number: [id number] Date of Birth/Sex: July 07, 1923 (84 y.o. F) Treating RN: Cornell Barman Primary Care Jahnasia Tatum: Doy Hutching,  Dellis Filbert Other Clinician: Referring Zamoria Boss: Marjean Donna Treating Jackston O[place]aca/Extender: Melburn Hake, HOYT Weeks in Treatment:  0 Active Problems Location of Pain Severity and Description of Pain Patient Has Paino No Site Locations Pain Management and Medication Current Pain Management: Goals for Pain Management Topical or injectable lidocaine is offered to patient for acute pain when surgical debridement is performed. If needed, Patient is instructed to use over the counter pain medication for the following 24-48 hours after debridement. Wound care MDs do not prescribed pain medications. Patient has chronic pain or uncontrolled pain. Patient has been instructed to make an appointment with their Primary Care Physician for pain management. Electronic Signature(s) Signed: 02/03/2018 5:30:19 PM By: Gretta Cool, BSN, RN, CWS, Kim RN, BSN Entered By: Gretta Cool, BSN, RN, CWS, Kim on 02/03/2018 12:50:36 [place], [name] ([id number]) -------------------------------------------------------------------------------- Patient/Caregiver Education Details Patient Name: [name] Date of Service: 02/03/2018 12:30 PM Medical Record Number: [id number] Patient Account Number: [id number] Date of Birth/Gender: 1923-08-07 (84 y.o. F) Treating RN: Ahmed Prima Primary Care Physician: Fulton Reek Other Clinician: Referring Physician: Marjean Donna Treating Physician/Extender: Sharalyn Ink in Treatment: 0 Education Assessment Education Provided To: Patient Education Topics Provided Wound/Skin Impairment: Handouts: Caring for Your Ulcer, Skin Care Do's and Dont's, Other: change dressing as ordered Methods: Demonstration, Explain/Verbal Responses: State content correctly Electronic Signature(s) Signed: 02/03/2018 5:38:20 PM By: Alric Quan Entered By: Alric Quan on 02/03/2018 14:45:14 [name], [name] Kitchen ([id number]) -------------------------------------------------------------------------------- Wound Assessment Details Patient Name: [name] Date of Service: 02/03/2018 12:30 PM Medical Record Number:  [id number] Patient Account Number: [id number] Date of Birth/Sex: [birth year] (84 y.o. F) Treating RN: Cornell Barman Primary Care Aamari West: Fulton Reek Other Clinician: Referring Jaret Coppedge: Marjean Donna Treating Ousman Dise/Extender: Melburn Hake, HOYT Weeks in Treatment: 0 Wound Status Wound Number: 2 Primary Trauma, Other Etiology: Wound Location: Left Forearm Wound Open Wounding Event: Trauma Status: Date Acquired: 02/01/2018 Comorbid Cataracts, Chronic Obstructive Pulmonary Weeks Of Treatment: 0 History: Disease (COPD), Deep Vein Thrombosis, Clustered Wound: No Hypertension, Myocardial Infarction, Osteoarthritis Photos Photo Uploaded By: Gretta Cool, BSN, RN, CWS, Kim on 02/03/2018 13:57:50 Wound Measurements Length: (cm) 13 Width: (cm) 0.7 Depth: (cm) 0.1 Area: (cm) 7.147 Volume: (cm) 0.715 % Reduction in Area: % Reduction in Volume: Epithelialization: Large (67-100%) Tunneling: No Undermining: No Wound Description Classification: Partial Thickness Foul Odor Wound Margin: Indistinct, nonvisible Slough/Fi Exudate Amount: Medium Exudate Type: Sanguinous Exudate Color: red After Cleansing: No brino No Wound Bed Granulation Amount: None Present (0%) Exposed Structure Necrotic Amount: Large (67-100%) Fascia Exposed: No Necrotic Quality: Eschar Fat Layer (Subcutaneous Tissue) Exposed: No Tendon Exposed: No Muscle Exposed: No Joint Exposed: No Bone Exposed: No Limited to Skin Breakdown [name], Ann E. ([id number]) Periwound Skin Texture Texture Color No Abnormalities Noted: No No Abnormalities Noted: No Callus: No Atrophie Blanche: No Crepitus: No Cyanosis: No Excoriation: No Ecchymosis: Yes Induration: No Erythema: No Rash: No Hemosiderin Staining: No Scarring: No Mottled: No Pallor: No Moisture Rubor: No No Abnormalities Noted: No Dry / Scaly: No Maceration: No Wound Preparation Ulcer Cleansing: Rinsed/Irrigated with Saline Topical Anesthetic  Applied: None Treatment Notes Wound #2 (Left Forearm) 1. Cleansed with: Clean wound with Normal Saline 2. Anesthetic Topical Lidocaine 4% cream to wound bed prior to debridement 4. Dressing Applied: Xeroform 5. Secondary Dressing Applied Kerlix/Conform Non-Adherent pad 7. Secured with Tape Notes Horticulturist, commercial) Signed: 02/03/2018 5:30:19 PM By: Gretta Cool, BSN, RN, CWS, Kim RN, BSN Entered By: Gretta Cool, BSN, RN, CWS, Kim on 02/03/2018 13:09:03 [place], [name] ([id number]) -------------------------------------------------------------------------------- Wound Assessment Details  Patient Name: HANSINI, CLODFELTER Date of Service: 02/03/2018 12:30 PM Medical Record Number: [id number] Patient Account Number: [id number] Date of Birth/Sex: 12/11/1922 (84 y.o. F) Treating RN: Cornell Barman Primary Care Zadkiel Dragan: Fulton Reek Other Clinician: Referring Kassie Keng: Marjean Donna Treating Tuff Clabo/Extender: Melburn Hake, HOYT Weeks in Treatment: 0 Wound Status Wound Number: 3 Primary Venous Leg Ulcer Etiology: Wound Location: Left Lower Leg - Posterior Wound Open Wounding Event: Gradually Appeared Status: Date Acquired: 12/05/2017 Comorbid Cataracts, Chronic Obstructive Pulmonary Weeks Of Treatment: 0 History: Disease (COPD), Deep Vein Thrombosis, Clustered Wound: No Hypertension, Myocardial Infarction, Osteoarthritis Photos Photo Uploaded By: Gretta Cool, BSN, RN, CWS, Kim on 02/03/2018 13:57:51 Wound Measurements Length: (cm) 2.5 Width: (cm) 2 Depth: (cm) 0.2 Area: (cm) 3.927 Volume: (cm) 0.785 % Reduction in Area: 0% % Reduction in Volume: 0% Epithelialization: Small (1-33%) Tunneling: No Undermining: No Wound Description Full Thickness Without Exposed Support Classification: Structures Wound Margin: Indistinct, nonvisible Exudate Large Amount: Exudate Type: Serous Exudate Color: amber Foul Odor After Cleansing: No Slough/Fibrino Yes Wound Bed Granulation Amount:  Small (1-33%) Exposed Structure Granulation Quality: Pink Fascia Exposed: No Necrotic Amount: Large (67-100%) Fat Layer (Subcutaneous Tissue) Exposed: Yes Necrotic Quality: Adherent Slough Tendon Exposed: No Muscle Exposed: No Joint Exposed: No Tippett, Alayla E. ([id number]) Bone Exposed: No Periwound Skin Texture Texture Color No Abnormalities Noted: No No Abnormalities Noted: No Callus: No Atrophie Blanche: No Crepitus: No Cyanosis: No Excoriation: No Ecchymosis: No Induration: No Erythema: No Rash: No Hemosiderin Staining: No Scarring: No Mottled: No Pallor: No Moisture Rubor: No No Abnormalities Noted: No Dry / Scaly: No Maceration: Yes Wound Preparation Ulcer Cleansing: Rinsed/Irrigated with Saline Topical Anesthetic Applied: Other: lidociane 4%, Treatment Notes Wound #3 (Left, Posterior Lower Leg) 1. Cleansed with: Clean wound with Normal Saline 2. Anesthetic Topical Lidocaine 4% cream to wound bed prior to debridement 4. Dressing Applied: Other dressing (specify in notes) 5. Secondary Dressing Applied ABD Pad Kerlix/Conform 7. Secured with Tape Notes silvercel, kerlix, coban, unna to Engineer, production) Signed: 02/03/2018 5:30:19 PM By: Gretta Cool, BSN, RN, CWS, Kim RN, BSN Entered By: Gretta Cool, BSN, RN, CWS, Kim on 02/03/2018 13:11:25 LYLY, CANIZALES ([id number]) -------------------------------------------------------------------------------- Vitals Details Patient Name: [name] Date of Service: 02/03/2018 12:30 PM Medical Record Number: [id number] Patient Account Number: [id number] Date of Birth/Sex: [birth year] (84 y.o. F) Treating RN: Cornell Barman Primary Care Carolie Mcilrath: Fulton Reek Other Clinician: Referring Abhijay Morriss: Marjean Donna Treating Sherif Millspaugh/Extender: Melburn Hake, HOYT Weeks in Treatment: 0 Vital Signs Time Taken: 12:45 Temperature (F): 98.4 Height (in): 66 Pulse (bpm): 78 Source: Stated Respiratory Rate (breaths/min):  16 Weight (lbs): 134 Blood Pressure (mmHg): 110/78 Source: Stated Reference Range: 80 - 120 mg / dl Body Mass Index (BMI): 21.6 Electronic Signature(s) Signed: 02/03/2018 5:30:19 PM By: Gretta Cool, BSN, RN, CWS, Kim RN, BSN Entered By: Gretta Cool, BSN, RN, CWS, Kim on 02/03/2018 12:51:40

## 2018-02-05 NOTE — Progress Notes (Signed)
FAIGA, STONES ([id number]) Visit Report for 02/03/2018 Chief Complaint Document Details Patient Name: [name], [name] Date of Service: 02/03/2018 12:30 PM Medical Record Number: [id number] Patient Account Number: [id number] Date of Birth/Sex: 10/25/1922 (84 y.o. F) Treating RN: Montey Hora Primary Care Provider: Fulton Reek Other Clinician: Referring Provider: Marjean Donna Treating Provider/Extender: Melburn Hake, Naidelyn Parrella Weeks in Treatment: 0 Information Obtained from: Patient Chief Complaint Left forearm wound and left lower extremity ulcer Electronic Signature(s) Signed: 02/03/2018 4:06:40 PM By: Worthy Keeler PA-C Entered By: Worthy Keeler on 02/03/2018 14:32:26 Meyers, Ann Quale ([id number]) -------------------------------------------------------------------------------- HPI Details Patient Name: [name] Date of Service: 02/03/2018 12:30 PM Medical Record Number: [id number] Patient Account Number: [id number] Date of Birth/Sex: 03-31-1923 (84 y.o. F) Treating RN: Montey Hora Primary Care Provider: Fulton Reek Other Clinician: Referring Provider: Marjean Donna Treating Provider/Extender: Melburn Hake, Angelize Ryce Weeks in Treatment: 0 History of Present Illness HPI Description: the patient is a 84 year old female with a history of hypertension and atrial fibrillation COPD who suffered a fall on her left wrist approximately 3-4 weeks ago. She cannot remember the specific events surrounding the fall. She did go to the hospital. The hospital an x-ray of the left wrist reveal no swelling or fractures. The patient has been dressing the wound with a Telfa dressing. She was sent to the wound care center on a referral from her primary care doctor. She denies any significant drainage or fevers. She states that her pain has gotten better overall. She is nondiabetic. The patient presents for followup today. She is doing well. No fevers. Readmission: 02/03/18 on evaluation today  patient presents for initial evaluation and our clinic is referral from [facility] ER. She sustained a skin tear on Wednesday of this week, two days ago to the left forearm. Subsequently she is on Plavix due to a history of TIAs. Subsequently she does have a fairly significant bruising to the left forearm. This occurred when she was walking in her garden and her caretaker who was with her noted that she began to fall and attempted to grab her in order to prevent the fall subsequently causing a skin tear on the left forearm. With that being said this is causing some discomfort to the patient she tells me. In regard to left lateral cath ulcer this is something that arose gradually 6 to 8 weeks ago. She has seen dermatology they recommended Vaseline. Subsequently she saw her cardiologist and they recommended triple antibiotic ointment. Unfortunately neither has really improved her overall wound status at this point. She has been tolerating the Steri-Strips and dressing changes to left forearm and again no dressing has really been applied to the left lateral lower extremity. She does have bilateral lower extremity edema. Electronic Signature(s) Signed: 02/03/2018 4:06:40 PM By: Worthy Keeler PA-C Entered By: Worthy Keeler on 02/03/2018 14:34:04 Meyers, Ann Quale ([id number]) -------------------------------------------------------------------------------- Physical Exam Details Patient Name: [name] Date of Service: 02/03/2018 12:30 PM Medical Record Number: [id number] Patient Account Number: [id number] Date of Birth/Sex: [birth year] (84 y.o. F) Treating RN: Montey Hora Primary Care Provider: Fulton Reek Other Clinician: Referring Provider: Marjean Donna Treating Provider/Extender: Melburn Hake, Willia Lampert Weeks in Treatment: 0 Constitutional sitting or standing blood pressure is within target range for patient.. pulse regular and within target range for  patient.Marland Kitchen respirations regular, non-labored and within target range for patient.Marland Kitchen temperature within target range for patient.. Well- nourished and well-hydrated in no acute distress. Eyes conjunctiva clear no eyelid edema noted. pupils  equal round and reactive to light and accommodation. Ears, Nose, Mouth, and Throat no gross abnormality of ear auricles or external auditory canals. normal hearing noted during conversation. mucus membranes moist. Respiratory normal breathing without difficulty. clear to auscultation bilaterally. Cardiovascular regular rate and rhythm with normal S1, S2. 1+ dorsalis pedis/posterior tibialis pulses. 1+ pitting edema of the bilateral lower extremities. Gastrointestinal (GI) soft, non-tender, non-distended, +BS. no ventral hernia noted. Musculoskeletal Patient unable to walk without assistance. Psychiatric this patient is able to make decisions and demonstrates good insight into disease process. Alert and Oriented x 3. pleasant and cooperative. Notes On inspection today patient has a quite significant skin tear to the left forearm which is noted. With that being said it seems to be secured very well with Steri-Strips at this point. I believe that the emergency department did an excellent job taking care of this wound. With that being said the only issue I see is that the dressing that was applied over did seem to be stuck when we removed it it caused some bleeding. I think we may want to switch to a Xeroform gauze dressing over the Steri-Strips. Otherwise this is going to take time to see whether or not the reattached skin tear flap truly takes or if it becomes necrotic and needs to be removed. Fortunately there's no evidence of infection. In regard to left lateral lower extremity ulcer the patient does have swelling of the bilateral lower extremities. She does have one plus pulses noted bilaterally and her ABI was also good per above. Overall I do think she  would benefit from compression therapy for what I do believe is a venous leg ulcer. Electronic Signature(s) Signed: 02/03/2018 4:06:40 PM By: Worthy Keeler PA-C Entered By: Worthy Keeler on 02/03/2018 14:36:15 Fosco, Ann Quale ([id number]) -------------------------------------------------------------------------------- Physician Orders Details Patient Name: [name] Date of Service: 02/03/2018 12:30 PM Medical Record Number: [id number] Patient Account Number: [id number] Date of Birth/Sex: Jun 20, 1923 (84 y.o. F) Treating RN: Montey Hora Primary Care Provider: Fulton Reek Other Clinician: Referring Provider: Marjean Donna Treating Provider/Extender: Melburn Hake, Ariauna Farabee Weeks in Treatment: 0 Verbal / Phone Orders: No Diagnosis Coding Wound Cleansing Wound #3 Left,Posterior Lower Leg o Cleanse wound with mild soap and water Wound #2 Left Forearm o Clean wound with Normal Saline. Primary Wound Dressing Wound #2 Left Forearm o Xeroform Wound #3 Left,Posterior Lower Leg o Silver Alginate Secondary Dressing Wound #2 Left Forearm o ABD and Kerlix/Conform - wrap lightly with conforming rolled gauze and secure with netting Wound #3 Left,Posterior Lower Leg o ABD pad Dressing Change Frequency Wound #2 Left Forearm o Change Dressing Monday, Wednesday, Friday Wound #3 Left,Posterior Lower Leg o Change Dressing Monday, Wednesday, Friday Follow-up Appointments Wound #2 Left Forearm o Return Appointment in 1 week. Wound #3 Left,Posterior Lower Leg o Return Appointment in 1 week. Edema Control Wound #3 Left,Posterior Lower Leg o Kerlix and Coban - Left Lower Extremity Home Health Wound #2 Left Forearm o [facility] [facility] CHIANNA, SPIRITO ([id number]) o [place] Nurse may visit PRN to address patientos wound care needs. o FACE TO FACE ENCOUNTER: MEDICARE and MEDICAID PATIENTS: I certify that this patient is under my  care and that I had a face-to-face encounter that meets the physician face-to-face encounter requirements with this patient on this date. The encounter with the patient was in whole or in part for the following MEDICAL CONDITION: (primary reason for [place]) MEDICAL NECESSITY: I certify, that based on  my findings, NURSING services are a medically necessary home health service. HOME BOUND STATUS: I certify that my clinical findings support that this patient is homebound (i.e., Due to illness or injury, pt requires aid of supportive devices such as crutches, cane, wheelchairs, walkers, the use of special transportation or the assistance of another person to leave their place of residence. There is a normal inability to leave the home and doing so requires considerable and taxing effort. Other absences are for medical reasons / religious services and are infrequent or of short duration when for other reasons). o If current dressing causes regression in wound condition, may D/C ordered dressing product/s and apply Normal Saline Moist Dressing daily until next [place] / Other MD appointment. [place] of regression in wound condition at [phone]. o Please direct any NON-WOUND related issues/requests for orders to patient's Primary Care Physician Wound #3 [place] [facility] [place] Nurse may visit PRN to address patientos wound care needs. o FACE TO FACE ENCOUNTER: MEDICARE and MEDICAID PATIENTS: I certify that this patient is under my care and that I had a face-to-face encounter that meets the physician face-to-face encounter requirements with this patient on this date. The encounter with the patient was in whole or in part for the following MEDICAL CONDITION: (primary reason for [place]) MEDICAL NECESSITY: I certify, that based on my findings, NURSING services are a medically necessary  home health service. HOME BOUND STATUS: I certify that my clinical findings support that this patient is homebound (i.e., Due to illness or injury, pt requires aid of supportive devices such as crutches, cane, wheelchairs, walkers, the use of special transportation or the assistance of another person to leave their place of residence. There is a normal inability to leave the home and doing so requires considerable and taxing effort. Other absences are for medical reasons / religious services and are infrequent or of short duration when for other reasons). o If current dressing causes regression in wound condition, may D/C ordered dressing product/s and apply Normal Saline Moist Dressing daily until next [place] / Other MD appointment. [place] of regression in wound condition at [phone]. o Please direct any NON-WOUND related issues/requests for orders to patient's Primary Care Physician Electronic Signature(s) Signed: 02/03/2018 4:06:40 PM By: Worthy Keeler PA-C Signed: 02/03/2018 5:45:37 PM By: Montey Hora Entered By: Montey Hora on 02/03/2018 13:52:11 Meyers, Ann Johnette Abraham ([id number]) -------------------------------------------------------------------------------- Problem List Details Patient Name: [name] Date of Service: 02/03/2018 12:30 PM Medical Record Number: [id number] Patient Account Number: [id number] Date of Birth/Sex: 04-27-1923 (84 y.o. F) Treating RN: Montey Hora Primary Care Provider: Fulton Reek Other Clinician: Referring Provider: Marjean Donna Treating Provider/Extender: Melburn Hake, Kirklin Mcduffee Weeks in Treatment: 0 Active Problems ICD-10 Impacting Encounter Code Description Active Date Wound Healing Diagnosis I87.312 Chronic venous hypertension (idiopathic) with ulcer of left 02/03/2018 No Yes lower extremity L97.222 Non-pressure chronic ulcer of left calf with fat layer exposed 02/03/2018 No Yes S51.802A Unspecified  open wound of left forearm, initial encounter 02/03/2018 No Yes I25.10 Atherosclerotic heart disease of native coronary artery 02/03/2018 No Yes without angina pectoris G45.9 Transient cerebral ischemic attack, unspecified 02/03/2018 No Yes Z79.01 Long term (current) use of anticoagulants 02/03/2018 No Yes J44.9 Chronic obstructive pulmonary disease, unspecified 02/03/2018 No Yes Z95.0 Presence of cardiac pacemaker 02/03/2018 No Yes Inactive Problems Resolved Problems Electronic Signature(s) Signed: 02/03/2018 4:54:58 PM By: Worthy Keeler PA-C Previous  Signature: 02/03/2018 4:06:40 PM Version By: Mena Goes, [place] ([id number]) Entered By: Worthy Keeler on 02/03/2018 16:54:10 Meyers, Ann Quale ([id number]) -------------------------------------------------------------------------------- Progress Note Details Patient Name: [name] Date of Service: 02/03/2018 12:30 PM Medical Record Number: [id number] Patient Account Number: [id number] Date of Birth/Sex: 1923-07-16 (84 y.o. F) Treating RN: Montey Hora Primary Care Provider: Fulton Reek Other Clinician: Referring Provider: Marjean Donna Treating Provider/Extender: Melburn Hake, Loman Logan Weeks in Treatment: 0 Subjective Chief Complaint Information obtained from Patient Left forearm wound and left lower extremity ulcer History of Present Illness (HPI) the patient is a 84 year old female with a history of hypertension and atrial fibrillation COPD who suffered a fall on her left wrist approximately 3-4 weeks ago. She cannot remember the specific events surrounding the fall. She did go to the hospital. The hospital an x-ray of the left wrist reveal no swelling or fractures. The patient has been dressing the wound with a Telfa dressing. She was sent to the wound care center on a referral from her primary care doctor. She denies any significant drainage or fevers. She states that her pain has gotten better overall. She is  nondiabetic. The patient presents for followup today. She is doing well. No fevers. Readmission: 02/03/18 on evaluation today patient presents for initial evaluation and our clinic is referral from [facility] ER. She sustained a skin tear on Wednesday of this week, two days ago to the left forearm. Subsequently she is on Plavix due to a history of TIAs. Subsequently she does have a fairly significant bruising to the left forearm. This occurred when she was walking in her garden and her caretaker who was with her noted that she began to fall and attempted to grab her in order to prevent the fall subsequently causing a skin tear on the left forearm. With that being said this is causing some discomfort to the patient she tells me. In regard to left lateral cath ulcer this is something that arose gradually 6 to 8 weeks ago. She has seen dermatology they recommended Vaseline. Subsequently she saw her cardiologist and they recommended triple antibiotic ointment. Unfortunately neither has really improved her overall wound status at this point. She has been tolerating the Steri-Strips and dressing changes to left forearm and again no dressing has really been applied to the left lateral lower extremity. She does have bilateral lower extremity edema. Wound History Patient presents with 2 open wounds that have been present for approximately 1 month. Patient has been treating wounds in the following manner: neosporin. Laboratory tests have not been performed in the last month. Patient reportedly has not tested positive for an antibiotic resistant organism. Patient reportedly has not tested positive for osteomyelitis. Patient reportedly has not had testing performed to evaluate circulation in the legs. Patient History Information obtained from Patient. Allergies Erythromycin, Penicillin (Reaction: rash), Sulfa Drugs (Reaction: rash), clindamycin (Reaction: upset stomach),  adhesive (Reaction: itching), codiene, Darvocet-N (Reaction: nausea), Demerol Family History Cancer - Child, Hypertension - Child, Thyroid Problems - Child, No family history of Diabetes, Heart Disease, Kidney Disease, Lung Disease, Seizures, Stroke, Tuberculosis. Social History [name], [name] ([id number]) Never smoker, Marital Status - Widowed, Alcohol Use - Never, Drug Use - No History, Caffeine Use - Daily. Medical History Eyes Denies history of Glaucoma, Optic Neuritis Respiratory Denies history of Aspiration, Asthma, Pneumothorax, Sleep Apnea, Tuberculosis Cardiovascular Denies history of Angina, Arrhythmia, Congestive Heart Failure, Coronary Artery Disease, Hypotension, Peripheral Arterial Disease, Peripheral Venous Disease, Phlebitis, Vasculitis  Gastrointestinal Denies history of Cirrhosis , Colitis, Crohn s, Hepatitis A, Hepatitis B, Hepatitis C Endocrine Denies history of Type I Diabetes, Type II Diabetes Genitourinary Denies history of End Stage Renal Disease Immunological Denies history of Lupus Erythematosus, Raynaud s, Scleroderma Integumentary (Skin) Denies history of History of Burn, History of pressure wounds Musculoskeletal Denies history of Gout, Rheumatoid Arthritis, Osteomyelitis Neurologic Denies history of Dementia, Neuropathy, Quadriplegia, Seizure Disorder Oncologic Denies history of Received Chemotherapy, Received Radiation Psychiatric Denies history of Anorexia/bulimia, Confinement Anxiety Hospitalization/Surgery History - 12/19/2017, [facility], TIA. 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 breast biopsy (most recent 2004)- no CA Review of Systems (ROS) Eyes The patient has no complaints or  symptoms. Respiratory The patient has no complaints or symptoms. Cardiovascular Complains or has symptoms of LE edema. Denies complaints or symptoms of Chest pain. Gastrointestinal The patient has no complaints or symptoms. Endocrine The patient has no complaints or symptoms. Genitourinary Complains or has symptoms of Incontinence/dribbling. Denies complaints or symptoms of Kidney failure/ Dialysis. [name], [name] ([id number]) Immunological The patient has no complaints or symptoms. Integumentary (Skin) Complains or has symptoms of Wounds, Bleeding or bruising tendency. Denies complaints or symptoms of Breakdown, Swelling. Musculoskeletal The patient has no complaints or symptoms. Neurologic The patient has no complaints or symptoms. Oncologic The patient has no complaints or symptoms. Psychiatric The patient has no complaints or symptoms. Objective Constitutional sitting or standing blood pressure is within target range for patient.. pulse regular and within target range for patient.Marland Kitchen respirations regular, non-labored and within target range for patient.Marland Kitchen temperature within target range for patient.. Well- nourished and well-hydrated in no acute distress. Vitals Time Taken: 12:45 PM, Height: 66 in, Source: Stated, Weight: 134 lbs, Source: Stated, BMI: 21.6, Temperature: 98.4  F, Pulse: 78 bpm, Respiratory Rate: 16 breaths/min, Blood Pressure: 110/78 mmHg. Eyes conjunctiva clear no eyelid edema noted. pupils equal round and reactive to light and accommodation. Ears, Nose, Mouth, and Throat no gross abnormality of ear auricles or external auditory canals. normal hearing noted during conversation. mucus membranes moist. Respiratory normal breathing without difficulty. clear to auscultation bilaterally. Cardiovascular regular rate and rhythm with normal S1, S2. 1+ dorsalis pedis/posterior tibialis pulses. 1+ pitting edema of the bilateral lower extremities. Gastrointestinal  (GI) soft, non-tender, non-distended, +BS. no ventral hernia noted. Musculoskeletal Patient unable to walk without assistance. Psychiatric this patient is able to make decisions and demonstrates good insight into disease process. Alert and Oriented x 3. pleasant and cooperative. General Notes: On inspection today patient has a quite significant skin tear to the left forearm which is noted. With that being [name], [name]. ([id number]) said it seems to be secured very well with Steri-Strips at this point. I believe that the emergency department did an excellent job taking care of this wound. With that being said the only issue I see is that the dressing that was applied over did seem to be stuck when we removed it it caused some bleeding. I think we may want to switch to a Xeroform gauze dressing over the Steri-Strips. Otherwise this is going to take time to see whether or not the reattached skin tear flap truly takes or if it becomes necrotic and needs to be removed. Fortunately there's no evidence of infection. In regard to left lateral lower extremity ulcer the patient does have swelling of  the bilateral lower extremities. She does have one plus pulses noted bilaterally and her ABI was also good per above. Overall I do think she would benefit from compression therapy for what I do believe is a venous leg ulcer. Integumentary (Hair, Skin) Wound #2 status is Open. Original cause of wound was Trauma. The wound is located on the Left Forearm. The wound measures 13cm length x 0.7cm width x 0.1cm depth; 7.147cm^2 area and 0.715cm^3 volume. The wound is limited to skin breakdown. There is no tunneling or undermining noted. There is a medium amount of sanguinous drainage noted. The wound margin is indistinct and nonvisible. There is no granulation within the wound bed. There is a large (67-100%) amount of necrotic tissue within the wound bed including Eschar. The periwound skin appearance exhibited:  Ecchymosis. The periwound skin appearance did not exhibit: Callus, Crepitus, Excoriation, Induration, Rash, Scarring, Dry/Scaly, Maceration, Atrophie Blanche, Cyanosis, Hemosiderin Staining, Mottled, Pallor, Rubor, Erythema. Wound #3 status is Open. Original cause of wound was Gradually Appeared. The wound is located on the Left,Posterior Lower Leg. The wound measures 2.5cm length x 2cm width x 0.2cm depth; 3.927cm^2 area and 0.785cm^3 volume. There is Fat Layer (Subcutaneous Tissue) Exposed exposed. There is no tunneling or undermining noted. There is a large amount of serous drainage noted. The wound margin is indistinct and nonvisible. There is small (1-33%) pink granulation within the wound bed. There is a large (67-100%) amount of necrotic tissue within the wound bed including Adherent Slough. The periwound skin appearance exhibited: Maceration. The periwound skin appearance did not exhibit: Callus, Crepitus, Excoriation, Induration, Rash, Scarring, Dry/Scaly, Atrophie Blanche, Cyanosis, Ecchymosis, Hemosiderin Staining, Mottled, Pallor, Rubor, Erythema. Assessment Active Problems ICD-10 I87.2 - Venous insufficiency (chronic) (peripheral) L97.222 - Non-pressure chronic ulcer of left calf with fat layer exposed S51.802A - Unspecified open wound of left forearm, initial encounter I25.10 - Atherosclerotic heart disease of native coronary artery without angina pectoris G45.9 - Transient cerebral ischemic attack, unspecified Z79.01 - Long term (current) use of anticoagulants J44.9 - Chronic obstructive pulmonary disease, unspecified Z95.0 - Presence of cardiac pacemaker Plan Wound Cleansing: Wound #3 Left,Posterior Lower Leg: Cleanse wound with mild soap and water Wound #2 Left Forearm: Clean wound with Normal Saline. Primary Wound Dressing: Wound #2 Left Forearm: Meyers, Ann E. ([id number]) Xeroform Wound #3 Left,Posterior Lower Leg: Silver Alginate Secondary Dressing: Wound #2  Left Forearm: ABD and Kerlix/Conform - wrap lightly with conforming rolled gauze and secure with netting Wound #3 Left,Posterior Lower Leg: ABD pad Dressing Change Frequency: Wound #2 Left Forearm: Change Dressing Monday, Wednesday, Friday Wound #3 Left,Posterior Lower Leg: Change Dressing Monday, Wednesday, Friday Follow-up Appointments: Wound #2 Left Forearm: Return Appointment in 1 week. Wound #3 Left,Posterior Lower Leg: Return Appointment in 1 week. Edema Control: Wound #3 Left,Posterior Lower Leg: Kerlix and Coban - Left Lower Extremity Home Health: Wound #2 Left Forearm: [place] [facility] [place] Nurse may visit PRN to address patient s wound care needs. FACE TO FACE ENCOUNTER: MEDICARE and MEDICAID PATIENTS: I certify that this patient is under my care and that I had a face-to-face encounter that meets the physician face-to-face encounter requirements with this patient on this date. The encounter with the patient was in whole or in part for the following MEDICAL CONDITION: (primary reason for [place]) MEDICAL NECESSITY: I certify, that based on my findings, NURSING services are a medically necessary home health service. HOME BOUND STATUS: I certify that my clinical findings support that this patient  is homebound (i.e., Due to illness or injury, pt requires aid of supportive devices such as crutches, cane, wheelchairs, walkers, the use of special transportation or the assistance of another person to leave their place of residence. There is a normal inability to leave the home and doing so requires considerable and taxing effort. Other absences are for medical reasons / religious services and are infrequent or of short duration when for other reasons). If current dressing causes regression in wound condition, may D/C ordered dressing product/s and apply Normal Saline Moist Dressing daily until next [place] / Other MD appointment.  [place] of regression in wound condition at [phone]. Please direct any NON-WOUND related issues/requests for orders to patient's Primary Care Physician Wound #3 Left,Posterior Lower Leg: [place] for [place] Nurse may visit PRN to address patient s wound care needs. FACE TO FACE ENCOUNTER: MEDICARE and MEDICAID PATIENTS: I certify that this patient is under my care and that I had a face-to-face encounter that meets the physician face-to-face encounter requirements with this patient on this date. The encounter with the patient was in whole or in part for the following MEDICAL CONDITION: (primary reason for [place]) MEDICAL NECESSITY: I certify, that based on my findings, NURSING services are a medically necessary home health service. HOME BOUND STATUS: I certify that my clinical findings support that this patient is homebound (i.e., Due to illness or injury, pt requires aid of supportive devices such as crutches, cane, wheelchairs, walkers, the use of special transportation or the assistance of another person to leave their place of residence. There is a normal inability to leave the home and doing so requires considerable and taxing effort. Other absences are for medical reasons / religious services and are infrequent or of short duration when for other reasons). If current dressing causes regression in wound condition, may D/C ordered dressing product/s and apply Normal Saline Moist Dressing daily until next [place] / Other MD appointment. [place] of regression in wound condition at [phone]. Please direct any NON-WOUND related issues/requests for orders to patient's Primary Care Physician [name] the above wound care orders for the next week. Patient is in agreement this plan. We will subtly see were things stand in one weeks time we see her for reevaluation. I  would like for home help to come out to [place], [place] ([id number]) rewrap her left leg and change the dressing on the left forearm on Monday and Wednesday and we will see her on Fridays. Patient and her family, her two daughters, are in agreement with this plan. Otherwise we will see were things stand in one weeks time. Please see above for specific wound care orders. We will see patient for re-evaluation in 1 week(s) here in the clinic. If anything worsens or changes patient will contact our office for additional recommendations. Electronic Signature(s) Signed: 02/03/2018 4:06:40 PM By: Worthy Keeler PA-C Entered By: Worthy Keeler on 02/03/2018 14:36:57 [place], Ann Quale ([id number]) -------------------------------------------------------------------------------- ROS/PFSH Details Patient Name: [name] Date of Service: 02/03/2018 12:30 PM Medical Record Number: [id number] Patient Account Number: [id number] Date of Birth/Sex: 22-Feb-1923 (84 y.o. F) Treating RN: Cornell Barman Primary Care Provider: Fulton Reek Other Clinician: Referring Provider: Marjean Donna Treating Provider/Extender: Melburn Hake, Micheil Klaus Weeks in Treatment: 0 Information Obtained From Patient Wound History Do you currently have one or more open woundso Yes How many open wounds do you currently  haveo 2 Approximately how long have you had your woundso 1 month How have you been treating your wound(s) until nowo neosporin Has your wound(s) ever healed and then re-openedo No Have you had any lab work done in the past montho No Have you tested positive for an antibiotic resistant organism (MRSA, VRE)o No Have you tested positive for osteomyelitis (bone infection)o No Have you had any tests for circulation on your legso No Cardiovascular Complaints and Symptoms: Positive for: LE edema Negative for: Chest pain Medical History: Positive for: Deep Vein Thrombosis - 03/2006 R leg; Hypertension; Myocardial Infarction  - 09/2005 Negative for: Angina; Arrhythmia; Congestive Heart Failure; Coronary Artery Disease; Hypotension; Peripheral Arterial Disease; Peripheral Venous Disease; Phlebitis; Vasculitis Past Medical History Notes: pacemaker placed 01/2011 Genitourinary Complaints and Symptoms: Positive for: Incontinence/dribbling Negative for: Kidney failure/ Dialysis Medical History: Negative for: End Stage Renal Disease Integumentary (Skin) Complaints and Symptoms: Positive for: Wounds; Bleeding or bruising tendency Negative for: Breakdown; Swelling Medical History: Negative for: History of Burn; History of pressure wounds Eyes Complaints and Symptoms: No Complaints or Symptoms Medical History: [name], [name]. ([id number]) Positive for: Cataracts - removed 2004 from R eye and L eye 2009 and 2014 Negative for: Glaucoma; Optic Neuritis Respiratory Complaints and Symptoms: No Complaints or Symptoms Medical History: Positive for: Chronic Obstructive Pulmonary Disease (COPD) Negative for: Aspiration; Asthma; Pneumothorax; Sleep Apnea; Tuberculosis Past Medical History Notes: PE 2007 Gastrointestinal Complaints and Symptoms: No Complaints or Symptoms Medical History: Negative for: Cirrhosis ; Colitis; Crohnos; Hepatitis A; Hepatitis B; Hepatitis C Past Medical History Notes: GERD; diverticuitis; colon spams; appendectomy 1935 Endocrine Complaints and Symptoms: No Complaints or Symptoms Medical History: Negative for: Type I Diabetes; Type II Diabetes Immunological Complaints and Symptoms: No Complaints or Symptoms Medical History: Negative for: Lupus Erythematosus; Raynaudos; Scleroderma Musculoskeletal Complaints and Symptoms: No Complaints or Symptoms Medical History: Positive for: Osteoarthritis Negative for: Gout; Rheumatoid Arthritis; Osteomyelitis Past Medical History Notes: scoliosis; bulging discs Neurologic Complaints and Symptoms: No Complaints or Symptoms Medical  History: Negative for: Dementia; Neuropathy; Quadriplegia; Seizure Disorder Past Medical History Notes: [name], [name] ([id number]) TIA's 2007,2013,2014 Oncologic Complaints and Symptoms: No Complaints or Symptoms Medical History: Negative for: Received Chemotherapy; Received Radiation Past Medical History Notes: Skin CA- squamous cell carcinoma 2014 R leg; mult. breast biopsy (most recent 2004)- no CA Psychiatric Complaints and Symptoms: No Complaints or Symptoms Medical History: Negative for: Anorexia/bulimia; Confinement Anxiety HBO Extended History Items Eyes: Cataracts Immunizations Pneumococcal Vaccine: Received Pneumococcal Vaccination: Yes Tetanus Vaccine: Last tetanus shot: 03/29/2012 Immunization Notes: Flu Shot 10/15; Pneumona 2013 Implantable Devices Hospitalization / Surgery History Name of Hospital Purpose of Hospitalization/Surgery Date [facility] TIA 12/19/2017 Family and Social History Cancer: Yes - Child; Diabetes: No; Heart Disease: No; Hypertension: Yes - Child; Kidney Disease: No; Lung Disease: No; Seizures: No; Stroke: No; Thyroid Problems: Yes - Child; Tuberculosis: No; Never smoker; Marital Status - Widowed; Alcohol Use: Never; Drug Use: No History; Caffeine Use: Daily; Financial Concerns: No; Food, Clothing or Shelter Needs: No; Support System Lacking: No; Transportation Concerns: No; Advanced Directives: Yes (Not Provided); Patient does not want information on Advanced Directives; Do not resuscitate: Yes (Not Provided); Living Will: Yes (Not Provided); Medical Power of Attorney: Yes - Williams Che- daughter (Not Provided) Electronic Signature(s) Signed: 02/03/2018 4:06:40 PM By: Worthy Keeler PA-C Signed: 02/03/2018 5:30:19 PM By: Gretta Cool, BSN, RN, CWS, Kim RN, BSN Entered By: Gretta Cool, BSN, RN, CWS, Kim on 02/03/2018 12:59:45 [place], Ann Quale ([id number]) -------------------------------------------------------------------------------- [facility] Details Patient  Name: [place],  Zafirah E. Date of Service: 02/03/2018 Medical Record Number: [id number] Patient Account Number: [id number] Date of Birth/Sex: Oct 26, 1922 (84 y.o. F) Treating RN: Montey Hora Primary Care Provider: Fulton Reek Other Clinician: Referring Provider: Marjean Donna Treating Provider/Extender: Melburn Hake, Clairissa Valvano Weeks in Treatment: 0 Diagnosis Coding ICD-10 Codes Code Description I87.312 Chronic venous hypertension (idiopathic) with ulcer of left lower extremity L97.222 Non-pressure chronic ulcer of left calf with fat layer exposed S51.802A Unspecified open wound of left forearm, initial encounter I25.10 Atherosclerotic heart disease of native coronary artery without angina pectoris G45.9 Transient cerebral ischemic attack, unspecified Z79.01 Long term (current) use of anticoagulants J44.9 Chronic obstructive pulmonary disease, unspecified Z95.0 Presence of cardiac pacemaker Facility Procedures CPT4 Code: 40981191 Description: [phone] - WOUND CARE VISIT-LEV 5 EST PT Modifier: Quantity: 1 Physician Procedures CPT4 Code Description: [id number] - WC PHYS LEVEL 4 - NEW PT ICD-10 Diagnosis Description I87.312 Chronic venous hypertension (idiopathic) with ulcer of left lo L97.222 Non-pressure chronic ulcer of left calf with fat layer exposed S51.802A  Unspecified open wound of left forearm, initial encounter I25.10 Atherosclerotic heart disease of native coronary artery withou Modifier: wer extremity t angina pectori Quantity: 1 s Electronic Signature(s) Signed: 02/03/2018 4:54:58 PM By: Worthy Keeler PA-C Previous Signature: 02/03/2018 4:06:40 PM Version By: Worthy Keeler PA-C Entered By: Worthy Keeler on 02/03/2018 16:54:37

## 2018-02-07 ENCOUNTER — Ambulatory Visit: Payer: Medicare Other | Admitting: Physician Assistant

## 2018-02-10 ENCOUNTER — Encounter: Payer: Medicare Other | Attending: Nurse Practitioner | Admitting: Nurse Practitioner

## 2018-02-10 DIAGNOSIS — L97222 Non-pressure chronic ulcer of left calf with fat layer exposed: Secondary | ICD-10-CM | POA: Diagnosis present

## 2018-02-10 DIAGNOSIS — Z950 Presence of cardiac pacemaker: Secondary | ICD-10-CM | POA: Diagnosis not present

## 2018-02-10 DIAGNOSIS — Z86718 Personal history of other venous thrombosis and embolism: Secondary | ICD-10-CM | POA: Insufficient documentation

## 2018-02-10 DIAGNOSIS — K219 Gastro-esophageal reflux disease without esophagitis: Secondary | ICD-10-CM | POA: Insufficient documentation

## 2018-02-10 DIAGNOSIS — Z86711 Personal history of pulmonary embolism: Secondary | ICD-10-CM | POA: Diagnosis not present

## 2018-02-10 DIAGNOSIS — Z7901 Long term (current) use of anticoagulants: Secondary | ICD-10-CM | POA: Diagnosis not present

## 2018-02-10 DIAGNOSIS — I2510 Atherosclerotic heart disease of native coronary artery without angina pectoris: Secondary | ICD-10-CM | POA: Diagnosis not present

## 2018-02-10 DIAGNOSIS — I252 Old myocardial infarction: Secondary | ICD-10-CM | POA: Diagnosis not present

## 2018-02-10 DIAGNOSIS — M419 Scoliosis, unspecified: Secondary | ICD-10-CM | POA: Insufficient documentation

## 2018-02-10 DIAGNOSIS — J449 Chronic obstructive pulmonary disease, unspecified: Secondary | ICD-10-CM | POA: Insufficient documentation

## 2018-02-10 DIAGNOSIS — S51812A Laceration without foreign body of left forearm, initial encounter: Secondary | ICD-10-CM | POA: Insufficient documentation

## 2018-02-10 DIAGNOSIS — S5012XA Contusion of left forearm, initial encounter: Secondary | ICD-10-CM | POA: Diagnosis not present

## 2018-02-10 DIAGNOSIS — Z8673 Personal history of transient ischemic attack (TIA), and cerebral infarction without residual deficits: Secondary | ICD-10-CM | POA: Diagnosis not present

## 2018-02-10 DIAGNOSIS — Y9301 Activity, walking, marching and hiking: Secondary | ICD-10-CM | POA: Diagnosis not present

## 2018-02-10 DIAGNOSIS — I87312 Chronic venous hypertension (idiopathic) with ulcer of left lower extremity: Secondary | ICD-10-CM | POA: Diagnosis not present

## 2018-02-10 DIAGNOSIS — I10 Essential (primary) hypertension: Secondary | ICD-10-CM | POA: Diagnosis not present

## 2018-02-13 NOTE — Progress Notes (Addendum)
[name], [name] ([id number]) Visit Report for 02/10/2018 Chief Complaint Document Details Patient Name: [name], [name] Date of Service: 02/10/2018 1:45 PM Medical Record Number: [id number] Patient Account Number: [id number] Date of Birth/Sex: Jan 23, 1923 (84 y.o. F) Treating RN: Montey Hora Primary Care Provider: Fulton Reek Other Clinician: Referring Provider: Fulton Reek Treating Provider/Extender: Cathie Olden in Treatment: 1 Information Obtained from: Patient Chief Complaint Left forearm wound and left lower extremity ulcer Electronic Signature(s) Signed: 02/10/2018 3:16:24 PM By: Lawanda Cousins Entered By: Lawanda Cousins on 02/10/2018 15:16:24 [name], [name] ([id number]) -------------------------------------------------------------------------------- Debridement Details Patient Name: [name] Date of Service: 02/10/2018 1:45 PM Medical Record Number: [id number] Patient Account Number: [id number] Date of Birth/Sex: 06-Feb-1923 (84 y.o. F) Treating RN: Montey Hora Primary Care Provider: Fulton Reek Other Clinician: Referring Provider: Fulton Reek Treating Provider/Extender: Cathie Olden in Treatment: 1 Debridement Performed for Wound #3 Left,Posterior Lower Leg Assessment: Performed By: Physician Lawanda Cousins, NP Debridement Type: Debridement Severity of Tissue Pre Fat layer exposed Debridement: Pre-procedure Verification/Time Yes - 14:15 Out Taken: Start Time: 14:15 Pain Control: Lidocaine 4% Topical Solution Total Area Debrided (L x W): 3.2 (cm) x 1 (cm) = 3.2 (cm) Tissue and other material Viable, Non-Viable, Subcutaneous, Fibrin/Exudate debrided: Level: Skin/Subcutaneous Tissue Debridement Description: Excisional Instrument: Curette Bleeding: Minimum Hemostasis Achieved: Pressure End Time: 14:17 Procedural Pain: 0 Post Procedural Pain: 0 Response to Treatment: Procedure was tolerated well Level of Consciousness: Awake and  Alert Post Procedure Vitals: Temperature: 98.0 Pulse: 71 Respiratory Rate: 18 Blood Pressure: Systolic Blood Pressure: 676 Diastolic Blood Pressure: 50 Post Debridement Measurements of Total Wound Length: (cm) 3.2 Width: (cm) 1 Depth: (cm) 0.2 Volume: (cm) 0.503 Character of Wound/Ulcer Post Debridement: Improved Severity of Tissue Post Debridement: Fat layer exposed Post Procedure Diagnosis Same as Pre-procedure Electronic Signature(s) Signed: 02/16/2018 7:49:14 AM By: Lawanda Cousins Signed: 02/16/2018 3:12:42 PM By: Chales Abrahams ([id number]) Previous Signature: 02/10/2018 4:22:23 PM Version By: Montey Hora Previous Signature: 02/10/2018 4:45:12 PM Version By: Lawanda Cousins Entered By: Lawanda Cousins on 02/16/2018 07:49:14 [name], [name] ([id number]) -------------------------------------------------------------------------------- Debridement Details Patient Name: [name] Date of Service: 02/10/2018 1:45 PM Medical Record Number: [id number] Patient Account Number: [id number] Date of Birth/Sex: 1922/11/15 (84 y.o. F) Treating RN: Montey Hora Primary Care Provider: Fulton Reek Other Clinician: Referring Provider: Fulton Reek Treating Provider/Extender: Cathie Olden in Treatment: 1 Debridement Performed for Wound #2 Left Forearm Assessment: Performed By: Physician Lawanda Cousins, NP Debridement Type: Debridement Pre-procedure Verification/Time Yes - 14:18 Out Taken: Start Time: 14:18 Pain Control: Lidocaine 4% Topical Solution Total Area Debrided (L x W): 2 (cm) x 2 (cm) = 4 (cm) Tissue and other material Non-Viable, Blood Clots, Skin: Dermis , Skin: Epidermis debrided: Level: Skin/Epidermis Debridement Description: Selective/Open Wound Instrument: Blade, Forceps Bleeding: Minimum Hemostasis Achieved: Pressure End Time: 14:25 Procedural Pain: 0 Post Procedural Pain: 0 Response to Treatment: Procedure was tolerated well Level  of Consciousness: Awake and Alert Post Procedure Vitals: Temperature: 98.0 Pulse: 71 Respiratory Rate: 18 Blood Pressure: Systolic Blood Pressure: 767 Diastolic Blood Pressure: 50 Post Debridement Measurements of Total Wound Length: (cm) 7.5 Width: (cm) 9 Depth: (cm) 0.1 Volume: (cm) 5.301 Character of Wound/Ulcer Post Debridement: Improved Post Procedure Diagnosis Same as Pre-procedure Electronic Signature(s) Signed: 02/16/2018 7:49:41 AM By: Lawanda Cousins Signed: 02/16/2018 3:12:42 PM By: Montey Hora Previous Signature: 02/10/2018 4:22:23 PM Version By: Montey Hora Previous Signature: 02/10/2018 4:45:12 PM Version By: Lawanda Cousins Entered By: Lawanda Cousins on 02/16/2018 07:49:41 [name], [name] (  [name], [name] ([id number]) -------------------------------------------------------------------------------- HPI Details Patient Name: [name] Date of Service: 02/10/2018 1:45 PM Medical Record Number: [id number] Patient Account Number: [id number] Date of Birth/Sex: Jun 15, 1923 (84 y.o. F) Treating RN: Montey Hora Primary Care Provider: Fulton Reek Other Clinician: Referring Provider: Fulton Reek Treating Provider/Extender: Cathie Olden in Treatment: 1 History of Present Illness HPI Description: 02/03/18 on evaluation today patient presents for initial evaluation and our clinic is referral from [facility] ER. She sustained a skin tear on Wednesday of this week, two days ago to the left forearm. Subsequently she is on Plavix due to a history of TIAs. Subsequently she does have a fairly significant bruising to the left forearm. This occurred when she was walking in her garden and her caretaker who was with her noted that she began to fall and attempted to grab her in order to prevent the fall subsequently causing a skin tear on the left forearm. With that being said this is causing some discomfort to the patient she tells me. In  regard to left lateral cath ulcer this is something that arose gradually 6 to 8 weeks ago. She has seen dermatology they recommended Vaseline. Subsequently she saw her cardiologist and they recommended triple antibiotic ointment. Unfortunately neither has really improved her overall wound status at this point. She has been tolerating the Steri-Strips and dressing changes to left forearm and again no dressing has really been applied to the left lateral lower extremity. She does have bilateral lower extremity edema. 02/10/18-She is here in follow-up evaluation for a left posterior calf ulcer and left forearm skin tear. The remaining Steri-Strips were removed from the left forearm, there is residual hematoma that was incised and drained. She tolerated debridement to the posterior calf. We will increase compression and she will follow-up next week Electronic Signature(s) Signed: 02/10/2018 3:17:30 PM By: Lawanda Cousins Entered By: Lawanda Cousins on 02/10/2018 15:17:30 [name], [name] ([id number]) -------------------------------------------------------------------------------- Physician Orders Details Patient Name: [name] Date of Service: 02/10/2018 1:45 PM Medical Record Number: [id number] Patient Account Number: [id number] Date of Birth/Sex: 1923/02/05 (84 y.o. F) Treating RN: Montey Hora Primary Care Provider: Fulton Reek Other Clinician: Referring Provider: Fulton Reek Treating Provider/Extender: Cathie Olden in Treatment: 1 Verbal / Phone Orders: No Diagnosis Coding Wound Cleansing Wound #2 Left Forearm o Clean wound with Normal Saline. Wound #3 Left,Posterior Lower Leg o Cleanse wound with mild soap and water Primary Wound Dressing Wound #2 Left Forearm o Other: - pink polymem by HHRN, mepitel AG and aquacel in clinic Wound #3 Left,Posterior Lower Leg o Iodoflex Secondary Dressing Wound #2 Left Forearm o ABD and Kerlix/Conform - wrap lightly with  conforming rolled gauze and secure with netting Wound #3 Left,Posterior Lower Leg o ABD pad Dressing Change Frequency Wound #2 Left Forearm o Change Dressing Monday, Wednesday, Friday Wound #3 Left,Posterior Lower Leg o Change Dressing Monday, Wednesday, Friday Follow-up Appointments Wound #2 Left Forearm o Return Appointment in 1 week. Wound #3 Left,Posterior Lower Leg o Return Appointment in 1 week. Edema Control Wound #3 Left,Posterior Lower Leg o 3 Layer Compression System - Left Lower Extremity Home Health Wound #2 Left Forearm o [place] Visits [name], [name] ([id number]) o [place] Nurse may visit PRN to address patientos wound care needs. o FACE TO FACE ENCOUNTER: MEDICARE and MEDICAID PATIENTS: I certify that this patient is under my care and that I had a face-to-face encounter that meets the physician face-to-face encounter requirements with  this patient on this date. The encounter with the patient was in whole or in part for the following MEDICAL CONDITION: (primary reason for [place]) MEDICAL NECESSITY: I certify, that based on my findings, NURSING services are a medically necessary home health service. HOME BOUND STATUS: I certify that my clinical findings support that this patient is homebound (i.e., Due to illness or injury, pt requires aid of supportive devices such as crutches, cane, wheelchairs, walkers, the use of special transportation or the assistance of another person to leave their place of residence. There is a normal inability to leave the home and doing so requires considerable and taxing effort. Other absences are for medical reasons / religious services and are infrequent or of short duration when for other reasons). o If current dressing causes regression in wound condition, may D/C ordered dressing product/s and apply Normal Saline Moist Dressing daily until next [place] / Other MD appointment.  [place] of regression in wound condition at [phone]. o Please direct any NON-WOUND related issues/requests for orders to patient's Primary Care Physician Wound #3 [place] Nurse may visit PRN to address patientos wound care needs. o FACE TO FACE ENCOUNTER: MEDICARE and MEDICAID PATIENTS: I certify that this patient is under my care and that I had a face-to-face encounter that meets the physician face-to-face encounter requirements with this patient on this date. The encounter with the patient was in whole or in part for the following MEDICAL CONDITION: (primary reason for [place]) MEDICAL NECESSITY: I certify, that based on my findings, NURSING services are a medically necessary home health service. HOME BOUND STATUS: I certify that my clinical findings support that this patient is homebound (i.e., Due to illness or injury, pt requires aid of supportive devices such as crutches, cane, wheelchairs, walkers, the use of special transportation or the assistance of another person to leave their place of residence. There is a normal inability to leave the home and doing so requires considerable and taxing effort. Other absences are for medical reasons / religious services and are infrequent or of short duration when for other reasons). o If current dressing causes regression in wound condition, may D/C ordered dressing product/s and apply Normal Saline Moist Dressing daily until next [place] / Other MD appointment. [place] of regression in wound condition at [phone]. o Please direct any NON-WOUND related issues/requests for orders to patient's Primary Care Physician Electronic Signature(s) Signed: 02/10/2018 4:22:23 PM By: Montey Hora Signed: 02/10/2018 4:45:12 PM By: Lawanda Cousins Entered By: Montey Hora on 02/10/2018 14:29:51 [name], [name]  ([id number]) -------------------------------------------------------------------------------- Problem List Details Patient Name: [name] Date of Service: 02/10/2018 1:45 PM Medical Record Number: [id number] Patient Account Number: [id number] Date of Birth/Sex: [birth year] (84 y.o. F) Treating RN: Montey Hora Primary Care Provider: Fulton Reek Other Clinician: Referring Provider: Fulton Reek Treating Provider/Extender: Cathie Olden in Treatment: 1 Active Problems ICD-10 Impacting Encounter Code Description Active Date Wound Healing Diagnosis I87.312 Chronic venous hypertension (idiopathic) with ulcer of left 02/03/2018 No Yes lower extremity L97.222 Non-pressure chronic ulcer of left calf with fat layer exposed 02/03/2018 No Yes S51.802S Unspecified open wound of left forearm, sequela 02/03/2018 No Yes I25.10 Atherosclerotic heart disease of native coronary artery 02/03/2018 No Yes without angina pectoris G45.9 Transient cerebral ischemic attack, unspecified 02/03/2018 No Yes Z79.01 Long term (current) use of anticoagulants 02/03/2018 No Yes J44.9 Chronic obstructive pulmonary disease, unspecified  02/03/2018 No Yes Z95.0 Presence of cardiac pacemaker 02/03/2018 No Yes Inactive Problems Resolved Problems Electronic Signature(s) Signed: 02/10/2018 3:16:11 PM By: Lawanda Cousins Previous Signature: 02/10/2018 3:13:31 PM Version By: Gweneth Dimitri, [place]. ([id number]) Entered By: Lawanda Cousins on 02/10/2018 15:16:10 Hatchell, Sianne Johnette Meyers ([id number]) -------------------------------------------------------------------------------- Progress Note Details Patient Name: [name] Date of Service: 02/10/2018 1:45 PM Medical Record Number: [id number] Patient Account Number: [id number] Date of Birth/Sex: 1922/10/21 (84 y.o. F) Treating RN: Montey Hora Primary Care Provider: Fulton Reek Other Clinician: Referring Provider: Fulton Reek Treating Provider/Extender:  Cathie Olden in Treatment: 1 Subjective Chief Complaint Information obtained from Patient Left forearm wound and left lower extremity ulcer History of Present Illness (HPI) 02/03/18 on evaluation today patient presents for initial evaluation and our clinic is [facility]. She sustained a skin tear on Wednesday of this week, two days ago to the left forearm. Subsequently she is on Plavix due to a history of TIAs. Subsequently she does have a fairly significant bruising to the left forearm. This occurred when she was walking in her garden and her caretaker who was with her noted that she began to fall and attempted to grab her in order to prevent the fall subsequently causing a skin tear on the left forearm. With that being said this is causing some discomfort to the patient she tells me. In regard to left lateral cath ulcer this is something that arose gradually 6 to 8 weeks ago. She has seen dermatology they recommended Vaseline. Subsequently she saw her cardiologist and they recommended triple antibiotic ointment. Unfortunately neither has really improved her overall wound status at this point. She has been tolerating the Steri-Strips and dressing changes to left forearm and again no dressing has really been applied to the left lateral lower extremity. She does have bilateral lower extremity edema. 02/10/18-She is here in follow-up evaluation for a left posterior calf ulcer and left forearm skin tear. The remaining Steri-Strips were removed from the left forearm, there is residual hematoma that was incised and drained. She tolerated debridement to the posterior calf. We will increase compression and she will follow-up next week Patient History Information obtained from Patient. Family History Cancer - Child, Hypertension - Child, Thyroid Problems - Child, No family history of Diabetes, Heart Disease, Kidney Disease, Lung Disease, Seizures, Stroke,  Tuberculosis. Social History Never smoker, Marital Status - Widowed, Alcohol Use - Never, Drug Use - No History, Caffeine Use - Daily. Medical History Hospitalization/Surgery History - 12/19/2017, [facility], TIA. 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 ([id number]) Skin CA- squamous cell carcinoma 2014 R leg; mult. breast biopsy (most recent 2004)- no CA Objective Constitutional Vitals Time Taken: 1:54 PM, Height: 66 in, Weight: 134 lbs, BMI: 21.6, Temperature: 98.0 F, Pulse: 71 bpm, Respiratory Rate: 18 breaths/min, Blood Pressure: 144/50 mmHg. Integumentary (Hair, Skin) Wound #2 status is Open. Original cause of wound was Trauma. The wound is located on the Left Forearm. The wound measures 7.5cm length x 9cm width x 0.1cm depth; 53.014cm^2 area and 5.301cm^3 volume. There is Fat Layer (Subcutaneous Tissue) Exposed exposed. There is no tunneling or undermining noted. There is a large amount of serosanguineous drainage noted. The wound margin is indistinct and nonvisible. There is small (1-33%) red granulation within the wound bed. There is a large (67-100%) amount of necrotic tissue within the  wound bed including Eschar and Adherent Slough. The periwound skin appearance exhibited: Ecchymosis. The periwound skin appearance did not exhibit: Callus, Crepitus, Excoriation, Induration, Rash, Scarring, Dry/Scaly, Maceration, Atrophie Blanche, Cyanosis, Hemosiderin Staining, Mottled, Pallor, Rubor, Erythema. Periwound temperature was noted as No Abnormality. The periwound has tenderness on palpation. Wound #3 status is Open. Original cause of wound was Gradually Appeared. The wound is located on the Left,Posterior Lower Leg. The wound measures 3.2cm length x 1cm width x 0.1cm  depth; 2.513cm^2 area and 0.251cm^3 volume. There is Fat Layer (Subcutaneous Tissue) Exposed exposed. There is no tunneling or undermining noted. There is a large amount of serous drainage noted. The wound margin is indistinct and nonvisible. There is medium (34-66%) red, pink granulation within the wound bed. There is a medium (34-66%) amount of necrotic tissue within the wound bed including Adherent Slough. The periwound skin appearance exhibited: Maceration. The periwound skin appearance did not exhibit: Callus, Crepitus, Excoriation, Induration, Rash, Scarring, Dry/Scaly, Atrophie Blanche, Cyanosis, Ecchymosis, Hemosiderin Staining, Mottled, Pallor, Rubor, Erythema. Periwound temperature was noted as No Abnormality. The periwound has tenderness on palpation. Assessment Active Problems ICD-10 Chronic venous hypertension (idiopathic) with ulcer of left lower extremity Non-pressure chronic ulcer of left calf with fat layer exposed Unspecified open wound of left forearm, sequela Atherosclerotic heart disease of native coronary artery without angina pectoris Transient cerebral ischemic attack, unspecified Long term (current) use of anticoagulants Chronic obstructive pulmonary disease, unspecified Presence of cardiac pacemaker [name], Ann E. ([id number]) Procedures Wound #2 Pre-procedure diagnosis of Wound #2 is a Trauma, Other located on the Left Forearm . There was a Selective/Open Wound Skin/Epidermis Debridement with a total area of 4 sq cm performed by Lawanda Cousins, NP. With the following instrument(s): Blade, and Forceps to remove Non-Viable tissue/material. Material removed includes Blood Clots, Skin: Dermis, and Skin: Epidermis after achieving pain control using Lidocaine 4% Topical Solution. No specimens were taken. A time out was conducted at 14:18, prior to the start of the procedure. A Minimum amount of bleeding was controlled with Pressure. The procedure was tolerated well with  a pain level of 0 throughout and a pain level of 0 following the procedure. Patient s Level of Consciousness post procedure was recorded as Awake and Alert. Post Debridement Measurements: 7.5cm length x 9cm width x 0.1cm depth; 5.301cm^3 volume. Character of Wound/Ulcer Post Debridement is improved. Post procedure Diagnosis Wound #2: Same as Pre-Procedure Wound #3 Pre-procedure diagnosis of Wound #3 is a Venous Leg Ulcer located on the Left,Posterior Lower Leg .Severity of Tissue Pre Debridement is: Fat layer exposed. There was a Excisional Skin/Subcutaneous Tissue Debridement with a total area of 3.2 sq cm performed by Lawanda Cousins, NP. With the following instrument(s): Curette to remove Viable and Non-Viable tissue/material. Material removed includes Subcutaneous Tissue and Fibrin/Exudate and after achieving pain control using Lidocaine 4% Topical Solution. No specimens were taken. A time out was conducted at 14:15, prior to the start of the procedure. A Minimum amount of bleeding was controlled with Pressure. The procedure was tolerated well with a pain level of 0 throughout and a pain level of 0 following the procedure. Patient s Level of Consciousness post procedure was recorded as Awake and Alert. Post Debridement Measurements: 3.2cm length x 1cm width x 0.2cm depth; 0.503cm^3 volume. Character of Wound/Ulcer Post Debridement is improved. Severity of Tissue Post Debridement is: Fat layer exposed. Post procedure Diagnosis Wound #3: Same as Pre-Procedure Plan Wound Cleansing: Wound #2 Left Forearm: Clean wound with Normal Saline. Wound #3 Left,Posterior  Lower Leg: Cleanse wound with mild soap and water Primary Wound Dressing: Wound #2 Left Forearm: Other: - pink polymem by HHRN, mepitel AG and aquacel in clinic Wound #3 Left,Posterior Lower Leg: Iodoflex Secondary Dressing: Wound #2 Left Forearm: ABD and Kerlix/Conform - wrap lightly with conforming rolled gauze and secure with  netting Wound #3 Left,Posterior Lower Leg: ABD pad Dressing Change Frequency: Wound #2 Left Forearm: Change Dressing Monday, Wednesday, Friday Wound #3 Left,Posterior Lower Leg: Change Dressing Monday, Wednesday, Friday Follow-up Appointments: [name], [name] ([id number]) Wound #2 Left Forearm: Return Appointment in 1 week. Wound #3 Left,Posterior Lower Leg: Return Appointment in 1 week. Edema Control: Wound #3 Left,Posterior Lower Leg: 3 Layer Compression System - Left Lower Extremity Home Health: Wound #2 Left Forearm: [place] Nurse may visit PRN to address patient s wound care needs. FACE TO FACE ENCOUNTER: MEDICARE and MEDICAID PATIENTS: I certify that this patient is under my care and that I had a face-to-face encounter that meets the physician face-to-face encounter requirements with this patient on this date. The encounter with the patient was in whole or in part for the following MEDICAL CONDITION: (primary reason for [place]) MEDICAL NECESSITY: I certify, that based on my findings, NURSING services are a medically necessary home health service. HOME BOUND STATUS: I certify that my clinical findings support that this patient is homebound (i.e., Due to illness or injury, pt requires aid of supportive devices such as crutches, cane, wheelchairs, walkers, the use of special transportation or the assistance of another person to leave their place of residence. There is a normal inability to leave the home and doing so requires considerable and taxing effort. Other absences are for medical reasons / religious services and are infrequent or of short duration when for other reasons). If current dressing causes regression in wound condition, may D/C ordered dressing product/s and apply Normal Saline Moist Dressing daily until next [place] / Other MD appointment. [place] of regression in wound condition at  [phone]. Please direct any NON-WOUND related issues/requests for orders to patient's Primary Care Physician Wound #3 Left,Posterior Lower Leg: [place] Nurse may visit PRN to address patient s wound care needs. FACE TO FACE ENCOUNTER: MEDICARE and MEDICAID PATIENTS: I certify that this patient is under my care and that I had a face-to-face encounter that meets the physician face-to-face encounter requirements with this patient on this date. The encounter with the patient was in whole or in part for the following MEDICAL CONDITION: (primary reason for [place]) MEDICAL NECESSITY: I certify, that based on my findings, NURSING services are a medically necessary home health service. HOME BOUND STATUS: I certify that my clinical findings support that this patient is homebound (i.e., Due to illness or injury, pt requires aid of supportive devices such as crutches, cane, wheelchairs, walkers, the use of special transportation or the assistance of another person to leave their place of residence. There is a normal inability to leave the home and doing so requires considerable and taxing effort. Other absences are for medical reasons / religious services and are infrequent or of short duration when for other reasons). If current dressing causes regression in wound condition, may D/C ordered dressing product/s and apply Normal Saline Moist Dressing daily until next [place] / Other MD appointment. [place] of regression in wound condition at [phone]. Please direct any NON-WOUND related issues/requests for orders to patient's Primary  Care Physician Electronic Signature(s) Signed: 02/10/2018 3:17:48 PM By: Lawanda Cousins Entered By: Lawanda Cousins on 02/10/2018 15:17:48 [place], [name] ([id number]) -------------------------------------------------------------------------------- ROS/PFSH Details Patient Name: [name] Date  of Service: 02/10/2018 1:45 PM Medical Record Number: [id number] Patient Account Number: [id number] Date of Birth/Sex: 02-Apr-1923 (84 y.o. F) Treating RN: Montey Hora Primary Care Provider: Fulton Reek Other Clinician: Referring Provider: Fulton Reek Treating Provider/Extender: Cathie Olden in Treatment: 1 Information Obtained From Patient Wound History Do you currently have one or more open woundso Yes How many open wounds do you currently haveo 2 Approximately how long have you had your woundso 1 month How have you been treating your wound(s) until nowo neosporin Has your wound(s) ever healed and then re-openedo No Have you had any lab work done in the past montho No Have you tested positive for an antibiotic resistant organism (MRSA, VRE)o No Have you tested positive for osteomyelitis (bone infection)o No Have you had any tests for circulation on your legso No Eyes Medical History: Positive for: Cataracts - removed 2004 from R eye and L eye 2009 and 2014 Negative for: Glaucoma; Optic Neuritis Respiratory Medical History: Positive for: Chronic Obstructive Pulmonary Disease (COPD) Negative for: Aspiration; Asthma; Pneumothorax; Sleep Apnea; Tuberculosis Past Medical History Notes: PE 2007 Cardiovascular Medical History: Positive for: Deep Vein Thrombosis - 03/2006 R leg; Hypertension; Myocardial Infarction - 09/2005 Negative for: Angina; Arrhythmia; Congestive Heart Failure; Coronary Artery Disease; Hypotension; Peripheral Arterial Disease; Peripheral Venous Disease; Phlebitis; Vasculitis Past Medical History Notes: pacemaker placed 01/2011 Gastrointestinal Medical History: Negative for: Cirrhosis ; Colitis; Crohnos; Hepatitis A; Hepatitis B; Hepatitis C Past Medical History Notes: GERD; diverticuitis; colon spams; appendectomy 1935 Endocrine Medical History: Negative for: Type I Diabetes; Type II Diabetes Genitourinary Mankin, Nhi E. ([id number]) Medical  History: Negative for: End Stage Renal Disease Immunological Medical History: Negative for: Lupus Erythematosus; Raynaudos; Scleroderma Integumentary (Skin) Medical History: Negative for: History of Burn; History of pressure wounds Musculoskeletal Medical History: Positive for: Osteoarthritis Negative for: Gout; Rheumatoid Arthritis; Osteomyelitis Past Medical History Notes: scoliosis; bulging discs Neurologic Medical History: Negative for: Dementia; Neuropathy; Quadriplegia; Seizure Disorder Past Medical History Notes: TIA's 2007,2013,2014 Oncologic Medical History: Negative for: Received Chemotherapy; Received Radiation Past Medical History Notes: Skin CA- squamous cell carcinoma 2014 R leg; mult. breast biopsy (most recent 2004)- no CA Psychiatric Medical History: Negative for: Anorexia/bulimia; Confinement Anxiety HBO Extended History Items Eyes: Cataracts Immunizations Pneumococcal Vaccine: Received Pneumococcal Vaccination: Yes Tetanus Vaccine: Last tetanus shot: 03/29/2012 Immunization Notes: Flu Shot 10/15; Pneumona 2013 Implantable Devices Hospitalization / Surgery History Name of Hospital Purpose of Hospitalization/Surgery Date [place] TIA 12/19/2017 Family and Social History JADEYN, HARGETT. ([id number]) Cancer: Yes - Child; Diabetes: No; Heart Disease: No; Hypertension: Yes - Child; Kidney Disease: No; Lung Disease: No; Seizures: No; Stroke: No; Thyroid Problems: Yes - Child; Tuberculosis: No; Never smoker; Marital Status - Widowed; Alcohol Use: Never; Drug Use: No History; Caffeine Use: Daily; Financial Concerns: No; Food, Clothing or Shelter Needs: No; Support System Lacking: No; Transportation Concerns: No; Advanced Directives: Yes (Not Provided); Patient does not want information on Advanced Directives; Do not resuscitate: Yes (Not Provided); Living Will: Yes (Not Provided); Medical Power of Attorney: Yes - Williams Che- daughter (Not Provided) Physician  Affirmation I have reviewed and agree with the above information. Electronic Signature(s) Signed: 02/10/2018 4:22:23 PM By: Montey Hora Signed: 02/10/2018 4:45:12 PM By: Lawanda Cousins Entered By: Lawanda Cousins on 02/10/2018 15:17:38 Hauk, Shavontae EMarland Meyers ([id number]) -------------------------------------------------------------------------------- SuperBill Details Patient Name: Reed Breech  E. Date of Service: 02/10/2018 Medical Record Number: [id number] Patient Account Number: [id number] Date of Birth/Sex: 05/22/1923 (84 y.o. F) Treating RN: Montey Hora Primary Care Provider: Fulton Reek Other Clinician: Referring Provider: Fulton Reek Treating Provider/Extender: Cathie Olden in Treatment: 1 Diagnosis Coding ICD-10 Codes Code Description [phone] Chronic venous hypertension (idiopathic) with ulcer of left lower extremity L97.222 Non-pressure chronic ulcer of left calf with fat layer exposed S51.802S Unspecified open wound of left forearm, sequela I25.10 Atherosclerotic heart disease of native coronary artery without angina pectoris G45.9 Transient cerebral ischemic attack, unspecified Z79.01 Long term (current) use of anticoagulants J44.9 Chronic obstructive pulmonary disease, unspecified Z95.0 Presence of cardiac pacemaker Facility Procedures CPT4 Code: 40814481 Description: [place] - DEB SUBQ TISSUE 20 SQ CM/< ICD-10 Diagnosis Description L97.222 Non-pressure chronic ulcer of left calf with fat layer exposed Modifier: Quantity: 1 CPT4 Code: 85631497 Description: 02637 - DEBRIDE WOUND 1ST 20 SQ CM OR < ICD-10 Diagnosis Description S51.802S Unspecified open wound of left forearm, sequela Modifier: Quantity: 1 Physician Procedures CPT4 Code: 8588502 Description: 11042 - WC PHYS SUBQ TISS 20 SQ CM ICD-10 Diagnosis Description L97.222 Non-pressure chronic ulcer of left calf with fat layer exposed Modifier: Quantity: 1 CPT4 Code: [id number] Description: [place] - WC PHYS DEBR  WO ANESTH 20 SQ CM ICD-10 Diagnosis Description S51.802S Unspecified open wound of left forearm, sequela Modifier: Quantity: 1 Electronic Signature(s) Signed: 02/16/2018 7:49:55 AM By: Lawanda Cousins Previous Signature: 02/16/2018 7:46:46 AM Version By: Lawanda Cousins Previous Signature: 02/10/2018 3:15:21 PM Version By: Lawanda Cousins Entered By: Lawanda Cousins on 02/16/2018 07:49:55

## 2018-02-13 NOTE — Progress Notes (Signed)
[name] [name] Meyers, [name] [name] Meyers ([id number]) Visit Report for 02/10/2018 Arrival Information Details Patient Name: [name], [name] Meyers Date of Service: 02/10/2018 1:45 PM Medical Record Number: [id number] Patient Account Number: [id number] Date of Birth/Sex: 02-26-1923 (84 y.o. F) Treating RN: [name] [name] Meyers Primary Care [name] [name] Meyers: [name] [name] Meyers Other Clinician: Referring [name] [name] Meyers: [name] [name] Meyers Treating [name] [name] Meyers/Extender: [name] [name] Meyers in Treatment: 1 Visit Information History Since Last Visit All ordered tests and consults were completed: No Patient Arrived: Ambulatory Added or deleted any medications: No Arrival Time: 13:53 Any new allergies or adverse reactions: No Accompanied By: daughters Had a fall or experienced change in No Transfer Assistance: None activities of daily living that may affect Patient Identification Verified: Yes risk of falls: Secondary Verification Process Yes Signs or symptoms of abuse/neglect since last visito No Completed: Hospitalized since last visit: No Patient Has Alerts: Yes Implantable device outside of the clinic excluding No Patient Alerts: Patient on Blood cellular tissue based products placed in the center Thinner since last visit: Eliquis Pain Present Now: No Electronic Signature(s) Signed: 02/10/2018 4:27:05 PM By: [name] [name] Meyers Entered By: [name] [name] Meyers on 02/10/2018 13:54:14 [name] [name] Meyers ([id number]) -------------------------------------------------------------------------------- Encounter Discharge Information Details Patient Name: [name] [name] Meyers Date of Service: 02/10/2018 1:45 PM Medical Record Number: [id number] Patient Account Number: [id number] Date of Birth/Sex: July 15, 1923 (84 y.o. F) Treating RN: [name] [name] Meyers Primary Care Travone Georg: [name] [name] Meyers Other Clinician: Referring Saadia Dewitt: [name] [name] Meyers Treating [name] [name] Meyers/Extender: [name] [name] Meyers in Treatment: 1 Encounter Discharge Information Items Discharge Condition:  Stable Ambulatory Status: Walker Discharge Destination: Home Transportation: Private Auto Accompanied By: daughters Schedule Follow-up Appointment: Yes Clinical Summary of Care: Provided on 02/10/2018 Form Type Recipient Paper Patient RM Electronic Signature(s) Signed: 02/10/2018 2:37:23 PM By: [name] [name] Meyers Entered By: [name] [name] Meyers on 02/10/2018 14:37:23 [name] [name] Meyers ([id number]) -------------------------------------------------------------------------------- Lower Extremity Assessment Details Patient Name: [name] [name] Meyers Date of Service: 02/10/2018 1:45 PM Medical Record Number: [id number] Patient Account Number: [id number] Date of Birth/Sex: 11-Sep-1922 (84 y.o. F) Treating RN: [name] [name] Meyers Primary Care Natia Fahmy: [name] [name] Meyers Other Clinician: Referring [name] [name] Meyers: [name] [name] Meyers Treating Korine Winton/Extender: [name] [name] Meyers in Treatment: 1 Edema Assessment Assessed: [Left: No] [Right: No] Edema: [Left: N] [Right: o] Calf Left: Right: Point of Measurement: 30 cm From Medial Instep 32 cm cm Ankle Left: Right: Point of Measurement: 10 cm From Medial Instep 23.3 cm cm Vascular Assessment Claudication: Claudication Assessment [Left:None] Pulses: Dorsalis Pedis Palpable: [Left:Yes] Posterior Tibial Extremity colors, hair growth, and conditions: Extremity Color: [Left:Hyperpigmented] Hair Growth on Extremity: [Left:No] Temperature of Extremity: [Left:Warm] Capillary Refill: [Left:< 3 seconds] Toe Nail Assessment Left: Right: Thick: No Discolored: No Deformed: No Improper Length and Hygiene: No Electronic Signature(s) Signed: 02/10/2018 4:27:05 PM By: [name] [name] Meyers Entered By: [name] [name] Meyers on 02/10/2018 14:08:00 [name] [name] Meyers ([id number]) -------------------------------------------------------------------------------- Multi Wound Chart Details Patient Name: [name] [name] Meyers Date of Service: 02/10/2018 1:45 PM Medical Record Number: [id number] Patient Account  Number: [id number] Date of Birth/Sex: 05/22/1923 (84 y.o. F) Treating RN: [name] [name] Meyers Primary Care Rithvik Orcutt: [name] [name] Meyers Other Clinician: Referring [name] [name] Meyers: [name] [name] Meyers Treating [name] [name] Meyers/Extender: [name] [name] Meyers in Treatment: 1 Vital Signs Height(in): 66 Pulse(bpm): 11 Weight(lbs): 134 Blood Pressure(mmHg): 144/50 Body Mass Index(BMI): 22 Temperature(F): 98.0 Respiratory Rate 18 (breaths/min): Photos: [2:No Photos] [3:No Photos] [N/A:N/A] Wound Location: [2:Left Forearm] [3:Left Lower Leg - Posterior] [N/A:N/A] Wounding Event: [2:Trauma] [3:Gradually Appeared] [N/A:N/A] Primary Etiology: [2:Trauma, Other] [3:Venous Leg Ulcer] [N/A:N/A] Comorbid History: [2:Cataracts, Chronic Obstructive Cataracts, Chronic Obstructive N/A Pulmonary Disease (COPD), Pulmonary Disease (COPD), Deep Vein Thrombosis, Hypertension, Myocardial Infarction, Osteoarthritis] [3:Deep  Vein Thrombosis, Hypertension,  Myocardial Infarction, Osteoarthritis] Date Acquired: [2:02/01/2018] [3:12/05/2017] [N/A:N/A] Weeks of Treatment: [2:1] [3:1] [N/A:N/A] Wound Status: [2:Open] [3:Open] [N/A:N/A] Measurements L x W x D [2:7.5x9x0.1] [3:3.2x1x0.1] [N/A:N/A] (cm) Area (cm) : [2:53.014] [3:2.513] [N/A:N/A] Volume (cm) : [2:5.301] [3:0.251] [N/A:N/A] % Reduction in Area: [2:-641.80%] [3:36.00%] [N/A:N/A] % Reduction in Volume: [2:-641.40%] [3:68.00%] [N/A:N/A] Classification: [2:Partial Thickness] [3:Full Thickness Without Exposed Support Structures] [N/A:N/A] Exudate Amount: [2:Large] [3:Large] [N/A:N/A] Exudate Type: [2:Serosanguineous] [3:Serous] [N/A:N/A] Exudate Color: [2:red, brown] [3:amber] [N/A:N/A] Wound Margin: [2:Indistinct, nonvisible] [3:Indistinct, nonvisible] [N/A:N/A] Granulation Amount: [2:Small (1-33%)] [3:Medium (34-66%)] [N/A:N/A] Granulation Quality: [2:Red] [3:Red, Pink] [N/A:N/A] Necrotic Amount: [2:Large (67-100%)] [3:Medium (34-66%)] [N/A:N/A] Necrotic Tissue: [2:Eschar,  Adherent Slough] [3:Adherent Slough] [N/A:N/A] Exposed Structures: [2:Fat Layer (Subcutaneous Tissue) Exposed: Yes Fascia: No Tendon: No Muscle: No Joint: No Bone: No] [3:Fat Layer (Subcutaneous Tissue) Exposed: Yes Fascia: No Tendon: No Muscle: No Joint: No Bone: No] [N/A:N/A] Epithelialization: [2:Small (1-33%)] [3:Small (1-33%)] [N/A:N/A] Debridement: [3:Debridement - Excisional] [N/A:N/A] Debridement - Selective/Open Wound Pre-procedure 14:18 14:15 N/A Verification/Time Out Taken: Pain Control: Lidocaine 4% Topical Solution Lidocaine 4% Topical Solution N/A Tissue Debrided: Blood Clots Subcutaneous N/A Level: Skin/Epidermis Skin/Subcutaneous Tissue N/A Debridement Area (sq cm): 4 3.2 N/A Instrument: Blade, Forceps Curette N/A Bleeding: Minimum Minimum N/A Hemostasis Achieved: Pressure Pressure N/A Procedural Pain: 0 0 N/A Post Procedural Pain: 0 0 N/A Debridement Treatment Procedure was tolerated well Procedure was tolerated well N/A Response: Post Debridement 7.5x9x0.1 3.2x1x0.2 N/A Measurements L x W x D (cm) Post Debridement Volume: 5.301 0.503 N/A (cm) Periwound Skin Texture: Excoriation: No Excoriation: No N/A Induration: No Induration: No Callus: No Callus: No Crepitus: No Crepitus: No Rash: No Rash: No Scarring: No Scarring: No Periwound Skin Moisture: Maceration: No Maceration: Yes N/A Dry/Scaly: No Dry/Scaly: No Periwound Skin Color: Ecchymosis: Yes Atrophie Blanche: No N/A Atrophie Blanche: No Cyanosis: No Cyanosis: No Ecchymosis: No Erythema: No Erythema: No Hemosiderin Staining: No Hemosiderin Staining: No Mottled: No Mottled: No Pallor: No Pallor: No Rubor: No Rubor: No Temperature: No Abnormality No Abnormality N/A Tenderness on Palpation: Yes Yes N/A Wound Preparation: Ulcer Cleansing: Ulcer Cleansing: N/A Rinsed/Irrigated with Saline Rinsed/Irrigated with Saline Topical Anesthetic Applied: Topical Anesthetic Applied: None Other:  lidociane 4% Procedures Performed: Debridement Debridement N/A Treatment Notes Wound #2 (Left Forearm) 1. Cleansed with: Clean wound with Normal Saline 2. Anesthetic Topical Lidocaine 4% cream to wound bed prior to debridement 4. Dressing Applied: Aquacel Other dressing (specify in notes) 5. Secondary Dressing Applied Dry Gauze Kerlix/Conform [place], Aeryn E. ([id number]) 7. Secured with Tape Notes netting, silver mepitel Wound #3 (Left, Posterior Lower Leg) 1. Cleansed with: Clean wound with Normal Saline Cleanse wound with antibacterial soap and water 2. Anesthetic Topical Lidocaine 4% cream to wound bed prior to debridement 4. Dressing Applied: Iodoflex 5. Secondary Dressing Applied ABD Pad Dry Gauze 7. Secured with Tape 3 Layer Compression System - Left Lower Extremity Electronic Signature(s) Signed: 02/10/2018 3:13:37 PM By: Lawanda Cousins Entered By: Lawanda Cousins on 02/10/2018 15:13:37 Zafar, Charlyne Quale ([id number]) -------------------------------------------------------------------------------- Multi-Disciplinary Care Plan Details Patient Name: [name] [name] Meyers Date of Service: 02/10/2018 1:45 PM Medical Record Number: [id number] Patient Account Number: [id number] Date of Birth/Sex: 11/16/1922 (84 y.o. F) Treating RN: [name] [name] Meyers Primary Care Quinesha Selinger: [name] [name] Meyers Other Clinician: Referring Tonjia Parillo: [name] [name] Meyers Treating Carmeron Heady/Extender: [name] [name] Meyers in Treatment: 1 Active Inactive ` Abuse / Safety / Falls / Self Care Management Nursing Diagnoses: History of Falls Goals: Patient will remain injury free related to falls Date Initiated: 02/03/2018 Target Resolution  Date: 04/14/2018 Goal Status: Active Interventions: Assess fall risk on admission and as needed Notes: ` Orientation to the Wound Care Program Nursing Diagnoses: Knowledge deficit related to the wound healing center program Goals: Patient/caregiver will verbalize understanding of  the [place] Date Initiated: 02/03/2018 Target Resolution Date: 04/15/2018 Goal Status: Active Interventions: Provide education on orientation to the wound center Notes: ` Wound/Skin Impairment Nursing Diagnoses: Impaired tissue integrity Goals: Ulcer/skin breakdown will heal within 14 weeks Date Initiated: 02/03/2018 Target Resolution Date: 04/15/2018 Goal Status: Active Interventions: ARDYCE, HEYER ([id number]) Assess patient/caregiver ability to obtain necessary supplies Assess patient/caregiver ability to perform ulcer/skin care regimen upon admission and as needed Assess ulceration(s) every visit Notes: Electronic Signature(s) Signed: 02/10/2018 4:22:23 PM By: [name] [name] Meyers Entered By: [name] [name] Meyers on 02/10/2018 14:14:48 Pisani, Imya [name] [name] Meyers ([id number]) -------------------------------------------------------------------------------- Pain Assessment Details Patient Name: [name] [name] Meyers Date of Service: 02/10/2018 1:45 PM Medical Record Number: [id number] Patient Account Number: [id number] Date of Birth/Sex: Apr 18, 1923 (84 y.o. F) Treating RN: [name] [name] Meyers Primary Care Rodric Punch: [name] [name] Meyers Other Clinician: Referring Kailo Kosik: [name] [name] Meyers Treating Kielan Dreisbach/Extender: [name] [name] Meyers in Treatment: 1 Active Problems Location of Pain Severity and Description of Pain Patient Has Paino Yes Site Locations Pain Location: Pain in Ulcers With Dressing Change: Yes Duration of the Pain. Constant / Intermittento Intermittent Rate the pain. Current Pain Level: 4 Pain Management and Medication Current Pain Management: Electronic Signature(s) Signed: 02/10/2018 4:27:05 PM By: [name] [name] Meyers Entered By: [name] [name] Meyers on 02/10/2018 13:54:33 Hesketh, Charlyne Quale ([id number]) -------------------------------------------------------------------------------- Patient/Caregiver Education Details Patient Name: [name] [name] Meyers Date of Service: 02/10/2018 1:45  PM Medical Record Number: [id number] Patient Account Number: [id number] Date of Birth/Gender: [birth year] (84 y.o. F) Treating RN: [name] [name] Meyers Primary Care Physician: [name] [name] Meyers Other Clinician: Referring Physician: Fulton Meyers Treating Physician/Extender: [name] [name] Meyers in Treatment: 1 Education Assessment Education Provided To: Patient and Caregiver daughters Education Topics Provided Wound/Skin Impairment: Handouts: Caring for Your Ulcer, Skin Care Do's and Dont's, Other: change dressing as ordered Methods: Demonstration, Explain/Verbal Responses: State content correctly Electronic Signature(s) Signed: 02/10/2018 4:48:34 PM By: Alric Quan Entered By: Alric Quan on 02/10/2018 14:37:08 [place], Celinda [name] [name] Meyers ([id number]) -------------------------------------------------------------------------------- Wound Assessment Details Patient Name: [name] [name] Meyers Date of Service: 02/10/2018 1:45 PM Medical Record Number: [id number] Patient Account Number: [id number] Date of Birth/Sex: 1923-02-16 (84 y.o. F) Treating RN: [name] [name] Meyers Primary Care Shenaya Lebo: [name] [name] Meyers Other Clinician: Referring Yan Pankratz: [name] [name] Meyers Treating Safir Michalec/Extender: Lawanda Cousins Weeks in Treatment: 1 Wound Status Wound Number: 2 Primary Trauma, Other Etiology: Wound Location: Left Forearm Wound Open Wounding Event: Trauma Status: Date Acquired: 02/01/2018 Comorbid Cataracts, Chronic Obstructive Pulmonary Weeks Of Treatment: 1 History: Disease (COPD), Deep Vein Thrombosis, Clustered Wound: No Hypertension, Myocardial Infarction, Osteoarthritis Photos Photo Uploaded By: Gretta Cool, BSN, RN, CWS, Kim on 02/10/2018 15:52:49 Wound Measurements Length: (cm) 7.5 Width: (cm) 9 Depth: (cm) 0.1 Area: (cm) 53.014 Volume: (cm) 5.301 % Reduction in Area: -641.8% % Reduction in Volume: -641.4% Epithelialization: Small (1-33%) Tunneling: No Undermining: No Wound  Description Classification: Partial Thickness Foul Odor Wound Margin: Indistinct, nonvisible Slough/Fi Exudate Amount: Large Exudate Type: Serosanguineous Exudate Color: red, brown After Cleansing: No brino No Wound Bed Granulation Amount: Small (1-33%) Exposed Structure Granulation Quality: Red Fascia Exposed: No Necrotic Amount: Large (67-100%) Fat Layer (Subcutaneous Tissue) Exposed: Yes Necrotic Quality: Eschar, Adherent Slough Tendon Exposed: No Muscle Exposed: No Joint Exposed: No Bone Exposed: No Bullard, Ashia E. ([id number]) Periwound Skin Texture Texture Color No Abnormalities Noted: No No Abnormalities Noted: No  Callus: No Atrophie Blanche: No Crepitus: No Cyanosis: No Excoriation: No Ecchymosis: Yes Induration: No Erythema: No Rash: No Hemosiderin Staining: No Scarring: No Mottled: No Pallor: No Moisture Rubor: No No Abnormalities Noted: No Dry / Scaly: No Temperature / Pain Maceration: No Temperature: No Abnormality Tenderness on Palpation: Yes Wound Preparation Ulcer Cleansing: Rinsed/Irrigated with Saline Topical Anesthetic Applied: None Treatment Notes Wound #2 (Left Forearm) 1. Cleansed with: Clean wound with Normal Saline 2. Anesthetic Topical Lidocaine 4% cream to wound bed prior to debridement 4. Dressing Applied: Aquacel Other dressing (specify in notes) 5. Secondary Dressing Applied Dry Gauze Kerlix/Conform 7. Secured with Tape Notes netting, silver mepitel Electronic Signature(s) Signed: 02/10/2018 4:27:05 PM By: [name] [name] Meyers Entered By: [name] [name] Meyers on 02/10/2018 14:05:22 Balsam [name], Halima EMarland [name] Meyers ([id number]) -------------------------------------------------------------------------------- Wound Assessment Details Patient Name: [name] [name] Meyers Date of Service: 02/10/2018 1:45 PM Medical Record Number: [id number] Patient Account Number: [id number] Date of Birth/Sex: [birth year] (84 y.o. F) Treating RN: [name] [name] Meyers Primary Care  Dandrea Medders: [name] [name] Meyers Other Clinician: Referring Danette Weinfeld: [name] [name] Meyers Treating Waverley Krempasky/Extender: Lawanda Cousins Weeks in Treatment: 1 Wound Status Wound Number: 3 Primary Venous Leg Ulcer Etiology: Wound Location: Left Lower Leg - Posterior Wound Open Wounding Event: Gradually Appeared Status: Date Acquired: 12/05/2017 Comorbid Cataracts, Chronic Obstructive Pulmonary Weeks Of Treatment: 1 History: Disease (COPD), Deep Vein Thrombosis, Clustered Wound: No Hypertension, Myocardial Infarction, Osteoarthritis Photos Photo Uploaded By: Gretta Cool, BSN, RN, CWS, Kim on 02/10/2018 15:52:50 Wound Measurements Length: (cm) 3.2 Width: (cm) 1 Depth: (cm) 0.1 Area: (cm) 2.513 Volume: (cm) 0.251 % Reduction in Area: 36% % Reduction in Volume: 68% Epithelialization: Small (1-33%) Tunneling: No Undermining: No Wound Description Full Thickness Without Exposed Support Foul Odo Classification: Structures Slough/F Wound Margin: Indistinct, nonvisible Exudate Large Amount: Exudate Type: Serous Exudate Color: amber r After Cleansing: No ibrino Yes Wound Bed Granulation Amount: Medium (34-66%) Exposed Structure Granulation Quality: Red, Pink Fascia Exposed: No Necrotic Amount: Medium (34-66%) Fat Layer (Subcutaneous Tissue) Exposed: Yes Necrotic Quality: Adherent Slough Tendon Exposed: No Muscle Exposed: No Joint Exposed: No Bracknell, Meshawn E. ([id number]) Bone Exposed: No Periwound Skin Texture Texture Color No Abnormalities Noted: No No Abnormalities Noted: No Callus: No Atrophie Blanche: No Crepitus: No Cyanosis: No Excoriation: No Ecchymosis: No Induration: No Erythema: No Rash: No Hemosiderin Staining: No Scarring: No Mottled: No Pallor: No Moisture Rubor: No No Abnormalities Noted: No Dry / Scaly: No Temperature / Pain Maceration: Yes Temperature: No Abnormality Tenderness on Palpation: Yes Wound Preparation Ulcer Cleansing: Rinsed/Irrigated with  Saline Topical Anesthetic Applied: Other: lidociane 4%, Treatment Notes Wound #3 (Left, Posterior Lower Leg) 1. Cleansed with: Clean wound with Normal Saline Cleanse wound with antibacterial soap and water 2. Anesthetic Topical Lidocaine 4% cream to wound bed prior to debridement 4. Dressing Applied: Iodoflex 5. Secondary Dressing Applied ABD Pad Dry Gauze 7. Secured with Tape 3 Layer Compression System - Left Lower Extremity Electronic Signature(s) Signed: 02/10/2018 4:22:23 PM By: [name] [name] Meyers Signed: 02/10/2018 4:27:05 PM By: [name] [name] Meyers Entered By: [name] [name] Meyers on 02/10/2018 14:14:24 Linker, Bailee [name] [name] Meyers ([id number]) -------------------------------------------------------------------------------- Vitals Details Patient Name: [name] [name] Meyers Date of Service: 02/10/2018 1:45 PM Medical Record Number: [id number] Patient Account Number: [id number] Date of Birth/Sex: 01-24-1923 (84 y.o. F) Treating RN: [name] [name] Meyers Primary Care Eden Toohey: [name] [name] Meyers Other Clinician: Referring Laterria Lasota: [name] [name] Meyers Treating Liliana Brentlinger/Extender: [name] [name] Meyers in Treatment: 1 Vital Signs Time Taken: 13:54 Temperature (F): 98.0 Height (in): 66 Pulse (bpm): 71 Weight (lbs): 134 Respiratory Rate (breaths/min): 18 Body Mass Index (BMI): 21.6 Blood  Pressure (mmHg): 144/50 Reference Range: 80 - 120 mg / dl Electronic Signature(s) Signed: 02/10/2018 4:27:05 PM By: [name] [name] Meyers Entered By: [name] [name] Meyers on 02/10/2018 13:54:59

## 2018-02-17 ENCOUNTER — Encounter: Payer: Medicare Other | Admitting: Physician Assistant

## 2018-02-17 DIAGNOSIS — L97222 Non-pressure chronic ulcer of left calf with fat layer exposed: Secondary | ICD-10-CM | POA: Diagnosis not present

## 2018-02-22 NOTE — Progress Notes (Signed)
[name] ([id number]) Visit Report for 02/17/2018 Chief Complaint Document Details Patient Name: [name] Date of Service: 02/17/2018 3:45 PM Medical Record Number: [id number] Patient Account Number: [id number] Date of Birth/Sex: 11/01/1922 (84 y.o. F) Treating RN: Montey Hora Primary Care Provider: Fulton Reek Other Clinician: Referring Provider: Fulton Reek Treating Provider/Extender: Melburn Hake, Zuleyma Scharf Weeks in Treatment: 2 Information Obtained from: Patient Chief Complaint Left forearm wound and left lower extremity ulcer Electronic Signature(s) Signed: 02/19/2018 11:18:14 PM By: Worthy Keeler PA-C Entered By: Worthy Keeler on 02/17/2018 16:22:01 Brigance, Clarrissa Johnette Abraham ([id number]) -------------------------------------------------------------------------------- HPI Details Patient Name: [name] Date of Service: 02/17/2018 3:45 PM Medical Record Number: [id number] Patient Account Number: [id number] Date of Birth/Sex: 1923/02/03 (84 y.o. F) Treating RN: Montey Hora Primary Care Provider: Fulton Reek Other Clinician: Referring Provider: Fulton Reek Treating Provider/Extender: Melburn Hake, Jeffery Bachmeier Weeks in Treatment: 2 History of Present Illness HPI Description: 02/03/18 on evaluation today patient presents for initial evaluation and our clinic is referral from [facility] ER. She sustained a skin tear on Wednesday of this week, two days ago to the left forearm. Subsequently she is on Plavix due to a history of TIAs. Subsequently she does have a fairly significant bruising to the left forearm. This occurred when she was walking in her garden and her caretaker who was with her noted that she began to fall and attempted to grab her in order to prevent the fall subsequently causing a skin tear on the left forearm. With that being said this is causing some discomfort to the patient she tells me. In regard to left lateral cath ulcer this is  something that arose gradually 6 to 8 weeks ago. She has seen dermatology they recommended Vaseline. Subsequently she saw her cardiologist and they recommended triple antibiotic ointment. Unfortunately neither has really improved her overall wound status at this point. She has been tolerating the Steri-Strips and dressing changes to left forearm and again no dressing has really been applied to the left lateral lower extremity. She does have bilateral lower extremity edema. 02/10/18-She is here in follow-up evaluation for a left posterior calf ulcer and left forearm skin tear. The remaining Steri-Strips were removed from the left forearm, there is residual hematoma that was incised and drained. She tolerated debridement to the posterior calf. We will increase compression and she will follow-up next week 02/17/18 on evaluation today patient appears to be doing rather well in regard to her left upper extremity ulcer. The skin tear is actually healing quite nicely in the majority possibly even 90% of the skin reattached on inspection today. This is obviously excellent news. With that being said her left lateral lower Trinity also is continuing to do well the Iodoflex does seem to be beneficial. She's been tolerating this without any significant discomfort at this point she tells me. Electronic Signature(s) Signed: 02/19/2018 11:18:14 PM By: Worthy Keeler PA-C Entered By: Worthy Keeler on 02/17/2018 17:15:50 [name] ([id number]) -------------------------------------------------------------------------------- Physical Exam Details Patient Name: [name] Date of Service: 02/17/2018 3:45 PM Medical Record Number: [id number] Patient Account Number: [id number] Date of Birth/Sex: 04/30/1923 (84 y.o. F) Treating RN: Montey Hora Primary Care Provider: Fulton Reek Other Clinician: Referring Provider: Fulton Reek Treating Provider/Extender: Melburn Hake, Yamaris Cummings Weeks in Treatment:  2 Constitutional Well-nourished and well-hydrated in no acute distress. Respiratory normal breathing without difficulty. clear to auscultation bilaterally. Cardiovascular regular rate and rhythm with normal S1, S2. 1+ pitting edema of the bilateral  lower extremities. Psychiatric this patient is able to make decisions and demonstrates good insight into disease process. Alert and Oriented x 3. pleasant and cooperative. Notes On inspection patient's wounds both appear to be doing fairly well there is very little Slough noted on the left lateral lower extremity she did have some discomfort with cleansing however. She is not having a lot of swelling noted which is also good news. No sharp debridement was performed or required at this point. Electronic Signature(s) Signed: 02/19/2018 11:18:14 PM By: Worthy Keeler PA-C Entered By: Worthy Keeler on 02/17/2018 17:16:38 [name] ([id number]) -------------------------------------------------------------------------------- Physician Orders Details Patient Name: [name] Date of Service: 02/17/2018 3:45 PM Medical Record Number: [id number] Patient Account Number: [id number] Date of Birth/Sex: 01/12/1923 (84 y.o. F) Treating RN: Montey Hora Primary Care Provider: Fulton Reek Other Clinician: Referring Provider: Fulton Reek Treating Provider/Extender: Melburn Hake, Wonda Goodgame Weeks in Treatment: 2 Verbal / Phone Orders: No Diagnosis Coding ICD-10 Coding Code Description I87.312 Chronic venous hypertension (idiopathic) with ulcer of left lower extremity L97.222 Non-pressure chronic ulcer of left calf with fat layer exposed S51.802S Unspecified open wound of left forearm, sequela I25.10 Atherosclerotic heart disease of native coronary artery without angina pectoris G45.9 Transient cerebral ischemic attack, unspecified Z79.01 Long term (current) use of anticoagulants J44.9 Chronic obstructive pulmonary disease, unspecified Z95.0  Presence of cardiac pacemaker Wound Cleansing Wound #2 Left Forearm o Clean wound with Normal Saline. Wound #3 Left,Posterior Lower Leg o Cleanse wound with mild soap and water Primary Wound Dressing Wound #2 Left Forearm o Xeroform Wound #3 Left,Posterior Lower Leg o Iodoflex Secondary Dressing Wound #2 Left Forearm o ABD and Kerlix/Conform - wrap lightly with conforming rolled gauze and secure with netting Wound #3 Left,Posterior Lower Leg o ABD pad Dressing Change Frequency Wound #2 Left Forearm o Change Dressing Monday, Wednesday, Friday Wound #3 Left,Posterior Lower Leg o Change Dressing Monday, Wednesday, Friday Follow-up Appointments Wound #2 Left Forearm [place], Mannie E. ([id number]) o Return Appointment in 1 week. Wound #3 Left,Posterior Lower Leg o Return Appointment in 1 week. Edema Control Wound #3 Left,Posterior Lower Leg o 3 Layer Compression System - Left Lower Extremity Home Health Wound #2 Left Forearm o [place] Nurse may visit PRN to address patientos wound care needs. o FACE TO FACE ENCOUNTER: MEDICARE and MEDICAID PATIENTS: I certify that this patient is under my care and that I had a face-to-face encounter that meets the physician face-to-face encounter requirements with this patient on this date. The encounter with the patient was in whole or in part for the following MEDICAL CONDITION: (primary reason for [place]) MEDICAL NECESSITY: I certify, that based on my findings, NURSING services are a medically necessary home health service. HOME BOUND STATUS: I certify that my clinical findings support that this patient is homebound (i.e., Due to illness or injury, pt requires aid of supportive devices such as crutches, cane, wheelchairs, walkers, the use of special transportation or the assistance of another person to leave their place of residence. There is a normal inability to leave the  home and doing so requires considerable and taxing effort. Other absences are for medical reasons / religious services and are infrequent or of short duration when for other reasons). o If current dressing causes regression in wound condition, may D/C ordered dressing product/s and apply Normal Saline Moist Dressing daily until next [place] / Other MD appointment. [place] of regression in wound condition  at [phone]. o Please direct any NON-WOUND related issues/requests for orders to patient's Primary Care Physician Wound #3 [place] Nurse may visit PRN to address patientos wound care needs. o FACE TO FACE ENCOUNTER: MEDICARE and MEDICAID PATIENTS: I certify that this patient is under my care and that I had a face-to-face encounter that meets the physician face-to-face encounter requirements with this patient on this date. The encounter with the patient was in whole or in part for the following MEDICAL CONDITION: (primary reason for [place]) MEDICAL NECESSITY: I certify, that based on my findings, NURSING services are a medically necessary home health service. HOME BOUND STATUS: I certify that my clinical findings support that this patient is homebound (i.e., Due to illness or injury, pt requires aid of supportive devices such as crutches, cane, wheelchairs, walkers, the use of special transportation or the assistance of another person to leave their place of residence. There is a normal inability to leave the home and doing so requires considerable and taxing effort. Other absences are for medical reasons / religious services and are infrequent or of short duration when for other reasons). o If current dressing causes regression in wound condition, may D/C ordered dressing product/s and apply Normal Saline Moist Dressing daily until next [place] / Other MD  appointment. [place] of regression in wound condition at [phone]. o Please direct any NON-WOUND related issues/requests for orders to patient's Primary Care Physician Electronic Signature(s) Signed: 02/17/2018 4:39:02 PM By: Montey Hora Signed: 02/19/2018 11:18:14 PM By: Worthy Keeler PA-C Entered By: Montey Hora on 02/17/2018 16:39:02 [place], Charlyne Meyers ([id number]) -------------------------------------------------------------------------------- Problem List Details Patient Name: [name] Date of Service: 02/17/2018 3:45 PM Medical Record Number: [id number] Patient Account Number: [id number] Date of Birth/Sex: 01/13/1923 (84 y.o. F) Treating RN: Montey Hora Primary Care Provider: Fulton Reek Other Clinician: Referring Provider: Fulton Reek Treating Provider/Extender: Melburn Hake, Donita Newland Weeks in Treatment: 2 Active Problems ICD-10 Impacting Encounter Code Description Active Date Wound Healing Diagnosis I87.312 Chronic venous hypertension (idiopathic) with ulcer of left 02/03/2018 No Yes lower extremity L97.222 Non-pressure chronic ulcer of left calf with fat layer exposed 02/03/2018 No Yes S51.802S Unspecified open wound of left forearm, sequela 02/03/2018 No Yes I25.10 Atherosclerotic heart disease of native coronary artery 02/03/2018 No Yes without angina pectoris G45.9 Transient cerebral ischemic attack, unspecified 02/03/2018 No Yes Z79.01 Long term (current) use of anticoagulants 02/03/2018 No Yes J44.9 Chronic obstructive pulmonary disease, unspecified 02/03/2018 No Yes Z95.0 Presence of cardiac pacemaker 02/03/2018 No Yes Inactive Problems Resolved Problems Electronic Signature(s) Signed: 02/19/2018 11:18:14 PM By: Mena Goes, Tresa E. ([id number]) Entered By: Worthy Keeler on 02/17/2018 16:21:55 [place], Rhodie EMarland Kitchen ([id number]) -------------------------------------------------------------------------------- Progress Note  Details Patient Name: [name] Date of Service: 02/17/2018 3:45 PM Medical Record Number: [id number] Patient Account Number: [id number] Date of Birth/Sex: 08/16/1923 (84 y.o. F) Treating RN: Montey Hora Primary Care Provider: Fulton Reek Other Clinician: Referring Provider: Fulton Reek Treating Provider/Extender: Melburn Hake, Maecy Podgurski Weeks in Treatment: 2 Subjective Chief Complaint Information obtained from Patient Left forearm wound and left lower extremity ulcer History of Present Illness (HPI) 02/03/18 on evaluation today patient presents for initial evaluation and our clinic [facility]. She sustained a skin tear on Wednesday of this week, two days ago to the left forearm. Subsequently she is on Plavix due to a history of TIAs. Subsequently she does  have a fairly significant bruising to the left forearm. This occurred when she was walking in her garden and her caretaker who was with her noted that she began to fall and attempted to grab her in order to prevent the fall subsequently causing a skin tear on the left forearm. With that being said this is causing some discomfort to the patient she tells me. In regard to left lateral cath ulcer this is something that arose gradually 6 to 8 weeks ago. She has seen dermatology they recommended Vaseline. Subsequently she saw her cardiologist and they recommended triple antibiotic ointment. Unfortunately neither has really improved her overall wound status at this point. She has been tolerating the Steri-Strips and dressing changes to left forearm and again no dressing has really been applied to the left lateral lower extremity. She does have bilateral lower extremity edema. 02/10/18-She is here in follow-up evaluation for a left posterior calf ulcer and left forearm skin tear. The remaining Steri-Strips were removed from the left forearm, there is residual hematoma that was incised and drained. She  tolerated debridement to the posterior calf. We will increase compression and she will follow-up next week 02/17/18 on evaluation today patient appears to be doing rather well in regard to her left upper extremity ulcer. The skin tear is actually healing quite nicely in the majority possibly even 90% of the skin reattached on inspection today. This is obviously excellent news. With that being said her left lateral lower Trinity also is continuing to do well the Iodoflex does seem to be beneficial. She's been tolerating this without any significant discomfort at this point she tells me. Patient History Information obtained from Patient. Family History Cancer - Child, Hypertension - Child, Thyroid Problems - Child, No family history of Diabetes, Heart Disease, Kidney Disease, Lung Disease, Seizures, Stroke, Tuberculosis. Social History Never smoker, Marital Status - Widowed, Alcohol Use - Never, Drug Use - No History, Caffeine Use - Daily. Medical History Hospitalization/Surgery History - 12/19/2017, [facility], TIA. Medical And Surgical History Notes Respiratory PE 2007 Cardiovascular pacemaker placed 01/2011 Gastrointestinal GERD; diverticuitis; colon spams; appendectomy 1935 DASIE, CHANCELLOR. ([id number]) Musculoskeletal scoliosis; bulging discs Neurologic TIA's 2007,2013,2014 Oncologic Skin CA- squamous cell carcinoma 2014 R leg; mult. breast biopsy (most recent 2004)- no CA Review of Systems (ROS) Constitutional Symptoms (General Health) Denies complaints or symptoms of Fever, Chills. Respiratory The patient has no complaints or symptoms. Cardiovascular The patient has no complaints or symptoms. Psychiatric The patient has no complaints or symptoms. Objective Constitutional Well-nourished and well-hydrated in no acute distress. Vitals Time Taken: 3:30 PM, Height: 66 in, Weight: 134 lbs, BMI: 21.6, Temperature: 98.5 F, Pulse: 68 bpm, Respiratory Rate: 18 breaths/min, Blood Pressure:  141/51 mmHg. Respiratory normal breathing without difficulty. clear to auscultation bilaterally. Cardiovascular regular rate and rhythm with normal S1, S2. 1+ pitting edema of the bilateral lower extremities. Psychiatric this patient is able to make decisions and demonstrates good insight into disease process. Alert and Oriented x 3. pleasant and cooperative. General Notes: On inspection patient's wounds both appear to be doing fairly well there is very little Slough noted on the left lateral lower extremity she did have some discomfort with cleansing however. She is not having a lot of swelling noted which is also good news. No sharp debridement was performed or required at this point. Integumentary (Hair, Skin) Wound #2 status is Open. Original cause of wound was Trauma. The wound is located on the Left Forearm. The wound measures 6cm length x 6cm  width x 1cm depth; 28.274cm^2 area and 28.274cm^3 volume. There is Fat Layer (Subcutaneous Tissue) Exposed exposed. There is no tunneling or undermining noted. There is a large amount of serosanguineous drainage noted. The wound margin is indistinct and nonvisible. There is small (1-33%) red granulation within the wound bed. There is a large (67-100%) amount of necrotic tissue within the wound bed. The periwound skin appearance exhibited: Ecchymosis, Erythema. The periwound skin appearance did not exhibit: Callus, Crepitus, Excoriation, Induration, Rash, Scarring, Dry/Scaly, Maceration, Atrophie Blanche, Cyanosis, Hemosiderin Staining, Mottled, Pallor, Rubor. The surrounding wound skin color is noted with erythema which is circumferential. Periwound temperature was noted as No Abnormality. The periwound has tenderness on palpation. [place], Chauvin ([id number]) Wound #3 status is Open. Original cause of wound was Gradually Appeared. The wound is located on the Left,Posterior Lower Leg. The wound measures 3cm length x 1.6cm width x 0.1cm depth;  3.77cm^2 area and 0.377cm^3 volume. There is Fat Layer (Subcutaneous Tissue) Exposed exposed. There is no tunneling or undermining noted. There is a large amount of serous drainage noted. The wound margin is indistinct and nonvisible. There is medium (34-66%) red, pink granulation within the wound bed. There is a medium (34-66%) amount of necrotic tissue within the wound bed including Adherent Slough. The periwound skin appearance exhibited: Scarring, Maceration, Erythema. The periwound skin appearance did not exhibit: Callus, Crepitus, Excoriation, Induration, Rash, Dry/Scaly, Atrophie Blanche, Cyanosis, Ecchymosis, Hemosiderin Staining, Mottled, Pallor, Rubor. The surrounding wound skin color is noted with erythema which is circumferential. Periwound temperature was noted as No Abnormality. The periwound has tenderness on palpation. Assessment Active Problems ICD-10 Chronic venous hypertension (idiopathic) with ulcer of left lower extremity Non-pressure chronic ulcer of left calf with fat layer exposed Unspecified open wound of left forearm, sequela Atherosclerotic heart disease of native coronary artery without angina pectoris Transient cerebral ischemic attack, unspecified Long term (current) use of anticoagulants Chronic obstructive pulmonary disease, unspecified Presence of cardiac pacemaker Plan Wound Cleansing: Wound #2 Left Forearm: Clean wound with Normal Saline. Wound #3 Left,Posterior Lower Leg: Cleanse wound with mild soap and water Primary Wound Dressing: Wound #2 Left Forearm: Xeroform Wound #3 Left,Posterior Lower Leg: Iodoflex Secondary Dressing: Wound #2 Left Forearm: ABD and Kerlix/Conform - wrap lightly with conforming rolled gauze and secure with netting Wound #3 Left,Posterior Lower Leg: ABD pad Dressing Change Frequency: Wound #2 Left Forearm: Change Dressing Monday, Wednesday, Friday Wound #3 Left,Posterior Lower Leg: Change Dressing Monday, Wednesday,  Friday Follow-up Appointments: Wound #2 Left Forearm: Return Appointment in 1 week. SHAKINAH, NAVIS E. ([id number]) Wound #3 Left,Posterior Lower Leg: Return Appointment in 1 week. Edema Control: Wound #3 Left,Posterior Lower Leg: 3 Layer Compression System - Left Lower Extremity Home Health: Wound #2 Left Forearm: [place] Nurse may visit PRN to address patient s wound care needs. FACE TO FACE ENCOUNTER: MEDICARE and MEDICAID PATIENTS: I certify that this patient is under my care and that I had a face-to-face encounter that meets the physician face-to-face encounter requirements with this patient on this date. The encounter with the patient was in whole or in part for the following MEDICAL CONDITION: (primary reason for [place]) MEDICAL NECESSITY: I certify, that based on my findings, NURSING services are a medically necessary home health service. HOME BOUND STATUS: I certify that my clinical findings support that this patient is homebound (i.e., Due to illness or injury, pt requires aid of supportive devices such as crutches, cane, wheelchairs, walkers, the use of special transportation or  the assistance of another person to leave their place of residence. There is a normal inability to leave the home and doing so requires considerable and taxing effort. Other absences are for medical reasons / religious services and are infrequent or of short duration when for other reasons). If current dressing causes regression in wound condition, may D/C ordered dressing product/s and apply Normal Saline Moist Dressing daily until next [place] / Other MD appointment. [place] of regression in wound condition at [phone]. Please direct any NON-WOUND related issues/requests for orders to patient's Primary Care Physician Wound #3 Left,Posterior Lower Leg: [place] Nurse may visit PRN to address  patient s wound care needs. FACE TO FACE ENCOUNTER: MEDICARE and MEDICAID PATIENTS: I certify that this patient is under my care and that I had a face-to-face encounter that meets the physician face-to-face encounter requirements with this patient on this date. The encounter with the patient was in whole or in part for the following MEDICAL CONDITION: (primary reason for [place]) MEDICAL NECESSITY: I certify, that based on my findings, NURSING services are a medically necessary home health service. HOME BOUND STATUS: I certify that my clinical findings support that this patient is homebound (i.e., Due to illness or injury, pt requires aid of supportive devices such as crutches, cane, wheelchairs, walkers, the use of special transportation or the assistance of another person to leave their place of residence. There is a normal inability to leave the home and doing so requires considerable and taxing effort. Other absences are for medical reasons / religious services and are infrequent or of short duration when for other reasons). If current dressing causes regression in wound condition, may D/C ordered dressing product/s and apply Normal Saline Moist Dressing daily until next [place] / Other MD appointment. [place] of regression in wound condition at [phone]. Please direct any NON-WOUND related issues/requests for orders to patient's Primary Care Physician I am going to suggest currently that we continue with the Current wound care measures including Xeroform for the left upper extremity and for the left lateral lower extremity will utilize the Iodoflex which seems to be clearing this up nicely. I think this may be the last week that we require the Iodoflex. We will subsequently see were she stands in one weeks time. Please see above for specific wound care orders. We will see patient for re-evaluation in 1 week(s) here in the clinic. If anything worsens  or changes patient will contact our office for additional recommendations. Electronic Signature(s) Signed: 02/19/2018 11:18:14 PM By: Worthy Keeler PA-C Entered By: Worthy Keeler on 02/17/2018 17:17:15 [place], Charlyne Meyers ([id number]) -------------------------------------------------------------------------------- ROS/PFSH Details Patient Name: [name] Date of Service: 02/17/2018 3:45 PM Medical Record Number: [id number] Patient Account Number: [id number] Date of Birth/Sex: 12/04/1922 (84 y.o. F) Treating RN: Montey Hora Primary Care Provider: Fulton Reek Other Clinician: Referring Provider: Fulton Reek Treating Provider/Extender: Melburn Hake, Fardeen Steinberger Weeks in Treatment: 2 Information Obtained From Patient Wound History Do you currently have one or more open woundso Yes How many open wounds do you currently haveo 2 Approximately how long have you had your woundso 1 month How have you been treating your wound(s) until nowo neosporin Has your wound(s) ever healed and then re-openedo No Have you had any lab work done in the past montho No Have you tested positive for an antibiotic resistant organism (MRSA, VRE)o No Have you tested positive for osteomyelitis (  bone infection)o No Have you had any tests for circulation on your legso No Constitutional Symptoms (General Health) Complaints and Symptoms: Negative for: Fever; Chills Eyes Medical History: Positive for: Cataracts - removed 2004 from R eye and L eye 2009 and 2014 Negative for: Glaucoma; Optic Neuritis Respiratory Complaints and Symptoms: No Complaints or Symptoms Medical History: Positive for: Chronic Obstructive Pulmonary Disease (COPD) Negative for: Aspiration; Asthma; Pneumothorax; Sleep Apnea; Tuberculosis Past Medical History Notes: PE 2007 Cardiovascular Complaints and Symptoms: No Complaints or Symptoms Medical History: Positive for: Deep Vein Thrombosis - 03/2006 R leg; Hypertension; Myocardial  Infarction - 09/2005 Negative for: Angina; Arrhythmia; Congestive Heart Failure; Coronary Artery Disease; Hypotension; Peripheral Arterial Disease; Peripheral Venous Disease; Phlebitis; Vasculitis Past Medical History Notes: pacemaker placed 01/2011 Gastrointestinal Watanabe, Melisse E. ([id number]) Medical History: Negative for: Cirrhosis ; Colitis; Crohnos; Hepatitis A; Hepatitis B; Hepatitis C Past Medical History Notes: GERD; diverticuitis; colon spams; appendectomy 1935 Endocrine Medical History: Negative for: Type I Diabetes; Type II Diabetes Genitourinary Medical History: Negative for: End Stage Renal Disease Immunological Medical History: Negative for: Lupus Erythematosus; Raynaudos; Scleroderma Integumentary (Skin) Medical History: Negative for: History of Burn; History of pressure wounds Musculoskeletal Medical History: Positive for: Osteoarthritis Negative for: Gout; Rheumatoid Arthritis; Osteomyelitis Past Medical History Notes: scoliosis; bulging discs Neurologic Medical History: Negative for: Dementia; Neuropathy; Quadriplegia; Seizure Disorder Past Medical History Notes: TIA's 2007,2013,2014 Oncologic Medical History: Negative for: Received Chemotherapy; Received Radiation Past Medical History Notes: Skin CA- squamous cell carcinoma 2014 R leg; mult. breast biopsy (most recent 2004)- no CA Psychiatric Complaints and Symptoms: No Complaints or Symptoms Medical History: Negative for: Anorexia/bulimia; Confinement Anxiety HBO Extended History Items Eyes: Cataracts Matthes, Makhiya E. ([id number]) Immunizations Pneumococcal Vaccine: Received Pneumococcal Vaccination: Yes Tetanus Vaccine: Last tetanus shot: 03/29/2012 Immunization Notes: Flu Shot 10/15; Pneumona 2013 Implantable Devices Hospitalization / Surgery History Name of Hospital Purpose of Hospitalization/Surgery Date [facility] TIA 12/19/2017 Family and Social History Cancer: Yes - Child; Diabetes: No; Heart  Disease: No; Hypertension: Yes - Child; Kidney Disease: No; Lung Disease: No; Seizures: No; Stroke: No; Thyroid Problems: Yes - Child; Tuberculosis: No; Never smoker; Marital Status - Widowed; Alcohol Use: Never; Drug Use: No History; Caffeine Use: Daily; Financial Concerns: No; Food, Clothing or Shelter Needs: No; Support System Lacking: No; Transportation Concerns: No; Advanced Directives: Yes (Not Provided); Patient does not want information on Advanced Directives; Do not resuscitate: Yes (Not Provided); Living Will: Yes (Not Provided); Medical Power of Attorney: Yes - Williams Che- daughter (Not Provided) Physician Affirmation I have reviewed and agree with the above information. Electronic Signature(s) Signed: 02/19/2018 11:18:14 PM By: Worthy Keeler PA-C Signed: 02/20/2018 5:27:17 PM By: Montey Hora Entered By: Worthy Keeler on 02/17/2018 17:16:08 Maiorana, Charlyne Meyers ([id number]) -------------------------------------------------------------------------------- SuperBill Details Patient Name: [name] Date of Service: 02/17/2018 Medical Record Number: [id number] Patient Account Number: [id number] Date of Birth/Sex: 10-18-1922 (84 y.o. F) Treating RN: Montey Hora Primary Care Provider: Fulton Reek Other Clinician: Referring Provider: Fulton Reek Treating Provider/Extender: Melburn Hake, Lauralynn Loeb Weeks in Treatment: 2 Diagnosis Coding ICD-10 Codes Code Description [phone] Chronic venous hypertension (idiopathic) with ulcer of left lower extremity L97.222 Non-pressure chronic ulcer of left calf with fat layer exposed S51.802S Unspecified open wound of left forearm, sequela I25.10 Atherosclerotic heart disease of native coronary artery without angina pectoris G45.9 Transient cerebral ischemic attack, unspecified Z79.01 Long term (current) use of anticoagulants J44.9 Chronic obstructive pulmonary disease, unspecified Z95.0 Presence of cardiac pacemaker Facility  Procedures CPT4 Code: [id number] Description: (Facility Use  Only) 29581LT - APPLY MULTLAY COMPRS LWR LT LEG Modifier: Quantity: 1 Physician Procedures CPT4 Code Description: [id number] 11657 - WC PHYS LEVEL 3 - EST PT ICD-10 Diagnosis Description I87.312 Chronic venous hypertension (idiopathic) with ulcer of left lo L97.222 Non-pressure chronic ulcer of left calf with fat layer exposed S51.802S  Unspecified open wound of left forearm, sequela I25.10 Atherosclerotic heart disease of native coronary artery withou Modifier: wer extremity t angina pectori Quantity: 1 s Electronic Signature(s) Signed: 02/19/2018 11:18:14 PM By: Worthy Keeler PA-C Previous Signature: 02/17/2018 4:45:52 PM Version By: Montey Hora Entered By: Worthy Keeler on 02/17/2018 17:17:32

## 2018-02-24 ENCOUNTER — Encounter: Payer: Medicare Other | Admitting: Physician Assistant

## 2018-02-24 DIAGNOSIS — L97222 Non-pressure chronic ulcer of left calf with fat layer exposed: Secondary | ICD-10-CM | POA: Diagnosis not present

## 2018-02-27 NOTE — Progress Notes (Signed)
MACKENZI, KROGH ([id number]) Visit Report for 02/24/2018 Arrival Information Details Patient Name: [name], [name] Date of Service: 02/24/2018 3:00 PM Medical Record Number: [id number] Patient Account Number: [id number] Date of Birth/Sex: Apr 28, 1923 (84 y.o. F) Treating RN: Roger Shelter Primary Care Cienna Dumais: Fulton Reek Other Clinician: Referring Thu Baggett: Fulton Reek Treating Abram Sax/Extender: Melburn Hake, HOYT Weeks in Treatment: 3 Visit Information History Since Last Visit All ordered tests and consults were completed: No Patient Arrived: [name] Added or deleted any medications: No Arrival Time: 14:56 Any new allergies or adverse reactions: No Accompanied By: daughter Had a fall or experienced change in No Transfer Assistance: None activities of daily living that may affect Patient Identification Verified: Yes risk of falls: Secondary Verification Process Yes Signs or symptoms of abuse/neglect since last visito No Completed: Hospitalized since last visit: No Patient Has Alerts: Yes Implantable device outside of the clinic excluding No Patient Alerts: Patient on Blood cellular tissue based products placed in the center Thinner since last visit: Eliquis Pain Present Now: No Electronic Signature([name]) Signed: 02/24/2018 4:42:38 PM By: Roger Shelter Entered By: Roger Shelter on 02/24/2018 14:58:38 [place], [name] Kitchen ([id number]) -------------------------------------------------------------------------------- Compression Therapy Details Patient Name: [name] Date of Service: 02/24/2018 3:00 PM Medical Record Number: [id number] Patient Account Number: [id number] Date of Birth/Sex: 04/11/1923 (84 y.o. F) Treating RN: Montey Hora Primary Care Lorielle Boehning: Fulton Reek Other Clinician: Referring Keveon Amsler: Fulton Reek Treating Tauheed Mcfayden/Extender: Melburn Hake, HOYT Weeks in Treatment: 3 Compression Therapy Performed for Wound Assessment: Wound #3  Left,Posterior Lower Leg Performed By: Clinician Montey Hora, RN Compression Type: Three Layer Pre Treatment ABI: 1.2 Post Procedure Diagnosis Same as Pre-procedure Electronic Signature([name]) Signed: 02/24/2018 4:49:47 PM By: Montey Hora Entered By: Montey Hora on 02/24/2018 15:43:44 [name], [name] ([id number]) -------------------------------------------------------------------------------- Encounter Discharge Information Details Patient Name: [name] Date of Service: 02/24/2018 3:00 PM Medical Record Number: [id number] Patient Account Number: [id number] Date of Birth/Sex: 1923-03-02 (84 y.o. F) Treating RN: Cornell Barman Primary Care Roxana Lai: Fulton Reek Other Clinician: Referring Corde Antonini: Fulton Reek Treating Wilmore Holsomback/Extender: Melburn Hake, HOYT Weeks in Treatment: 3 Encounter Discharge Information Items Discharge Condition: Stable Ambulatory Status: Ambulatory Discharge Destination: Home Transportation: Private Auto Accompanied By: daughter Schedule Follow-up Appointment: Yes Clinical Summary of Care: Electronic Signature([name]) Signed: 02/24/2018 4:42:56 PM By: Gretta Cool, BSN, RN, CWS, Kim RN, BSN Entered By: Gretta Cool, BSN, RN, CWS, Kim on 02/24/2018 15:44:31 Degracia, [name] ([id number]) -------------------------------------------------------------------------------- Lower Extremity Assessment Details Patient Name: [name] Date of Service: 02/24/2018 3:00 PM Medical Record Number: [id number] Patient Account Number: [id number] Date of Birth/Sex: 01-03-1923 (84 y.o. F) Treating RN: Roger Shelter Primary Care Winslow Verrill: Fulton Reek Other Clinician: Referring Deeric Cruise: Fulton Reek Treating Mackena Plummer/Extender: Melburn Hake, HOYT Weeks in Treatment: 3 Edema Assessment Assessed: [Left: No] [Right: No] Edema: [Left: Ye] [Right: [name]] Calf Left: Right: Point of Measurement: 30 cm From Medial Instep 31.2 cm cm Ankle Left: Right: Point of Measurement: 10 cm From  Medial Instep 24 cm cm Vascular Assessment Claudication: Claudication Assessment [Left:None] Pulses: Dorsalis Pedis Palpable: [Left:Yes] Posterior Tibial Extremity colors, hair growth, and conditions: Extremity Color: [Left:Normal] Hair Growth on Extremity: [Left:No] Temperature of Extremity: [Left:Warm] Capillary Refill: [Left:< 3 seconds] Toe Nail Assessment Left: Right: Thick: Yes Discolored: No Deformed: No Improper Length and Hygiene: No Electronic Signature([name]) Signed: 02/24/2018 4:42:38 PM By: Roger Shelter Entered By: Roger Shelter on 02/24/2018 15:12:48 [name], [name] Kitchen ([id number]) -------------------------------------------------------------------------------- Multi Wound Chart Details Patient Name: [name] Date of Service: 02/24/2018 3:00 PM Medical Record Number: [id number] Patient  Account Number: [id number] Date of Birth/Sex: Dec 24, 1922 (84 y.o. F) Treating RN: Montey Hora Primary Care Rolonda Pontarelli: Fulton Reek Other Clinician: Referring Keeara Frees: Fulton Reek Treating Keni Wafer/Extender: Melburn Hake, HOYT Weeks in Treatment: 3 Vital Signs Height(in): 66 Pulse(bpm): 70 Weight(lbs): 134 Blood Pressure(mmHg): 128/54 Body Mass Index(BMI): 22 Temperature(F): 98.3 Respiratory Rate 18 (breaths/min): Photos: [2:No Photos] [3:No Photos] [N/A:N/A] Wound Location: [2:Left Forearm] [3:Left Lower Leg - Posterior] [N/A:N/A] Wounding Event: [2:Trauma] [3:Gradually Appeared] [N/A:N/A] Primary Etiology: [2:Trauma, Other] [3:Venous Leg Ulcer] [N/A:N/A] Comorbid History: [2:Cataracts, Chronic Obstructive Cataracts, Chronic Obstructive N/A Pulmonary Disease (COPD), Pulmonary Disease (COPD), Deep Vein Thrombosis, Hypertension, Myocardial Infarction, Osteoarthritis] [3:Deep Vein Thrombosis, Hypertension,  Myocardial Infarction, Osteoarthritis] Date Acquired: [2:02/01/2018] [3:12/05/2017] [N/A:N/A] Weeks of Treatment: [2:3] [3:3] [N/A:N/A] Wound Status: [2:Open]  [3:Open] [N/A:N/A] Measurements L x W x D [2:3.4x3.2x0.2] [3:3x1.5x0.1] [N/A:N/A] (cm) Area (cm) : [2:8.545] [3:3.534] [N/A:N/A] Volume (cm) : [2:1.709] [3:0.353] [N/A:N/A] % Reduction in Area: [2:-19.60%] [3:10.00%] [N/A:N/A] % Reduction in Volume: [2:-139.00%] [3:55.00%] [N/A:N/A] Classification: [2:Partial Thickness] [3:Full Thickness Without Exposed Support Structures] [N/A:N/A] Exudate Amount: [2:Large] [3:Large] [N/A:N/A] Exudate Type: [2:Serosanguineous] [3:Serous] [N/A:N/A] Exudate Color: [2:red, brown] [3:amber] [N/A:N/A] Wound Margin: [2:Indistinct, nonvisible] [3:Indistinct, nonvisible] [N/A:N/A] Granulation Amount: [2:Large (67-100%)] [3:Medium (34-66%)] [N/A:N/A] Granulation Quality: [2:Red] [3:Red, Pink] [N/A:N/A] Necrotic Amount: [2:Small (1-33%)] [3:Medium (34-66%)] [N/A:N/A] Necrotic Tissue: [2:Eschar, Adherent Slough] [3:Adherent Slough] [N/A:N/A] Exposed Structures: [2:Fat Layer (Subcutaneous Tissue) Exposed: Yes Fascia: No Tendon: No Muscle: No Joint: No Bone: No] [3:Fat Layer (Subcutaneous Tissue) Exposed: Yes Fascia: No Tendon: No Muscle: No Joint: No Bone: No] [N/A:N/A] Epithelialization: [2:Small (1-33%)] [3:Small (1-33%)] [N/A:N/A] Periwound Skin Texture: [N/A:N/A] Excoriation: No Scarring: Yes Induration: No Excoriation: No Callus: No Induration: No Crepitus: No Callus: No Rash: No Crepitus: No Scarring: No Rash: No Periwound Skin Moisture: Maceration: No Maceration: Yes N/A Dry/Scaly: No Dry/Scaly: No Periwound Skin Color: Ecchymosis: Yes Atrophie Blanche: No N/A Atrophie Blanche: No Cyanosis: No Cyanosis: No Ecchymosis: No Erythema: No Erythema: No Hemosiderin Staining: No Hemosiderin Staining: No Mottled: No Mottled: No Pallor: No Pallor: No Rubor: No Rubor: No Temperature: No Abnormality No Abnormality N/A Tenderness on Palpation: Yes Yes N/A Wound Preparation: Ulcer Cleansing: Ulcer Cleansing: N/A Rinsed/Irrigated with  Saline Rinsed/Irrigated with Saline Topical Anesthetic Applied: Topical Anesthetic Applied: Other: lidocaine 4% Other: lidociane 4% Treatment Notes Electronic Signature([name]) Signed: 02/24/2018 4:49:47 PM By: Montey Hora Entered By: Montey Hora on 02/24/2018 15:32:30 [name], [name] ([id number]) -------------------------------------------------------------------------------- Multi-Disciplinary Care Plan Details Patient Name: [name] Date of Service: 02/24/2018 3:00 PM Medical Record Number: [id number] Patient Account Number: [id number] Date of Birth/Sex: [birth year] (84 y.o. F) Treating RN: Montey Hora Primary Care Misti Towle: Fulton Reek Other Clinician: Referring Gardenia Witter: Fulton Reek Treating Graciano Batson/Extender: Melburn Hake, HOYT Weeks in Treatment: 3 Active Inactive ` Abuse / Safety / Falls / Self Care Management Nursing Diagnoses: History of Falls Goals: Patient will remain injury free related to falls Date Initiated: 02/03/2018 Target Resolution Date: 04/14/2018 Goal Status: Active Interventions: Assess fall risk on admission and as needed Notes: ` Orientation to the Wound Care Program Nursing Diagnoses: Knowledge deficit related to the wound healing center program Goals: Patient/caregiver will verbalize understanding of the [employer] Date Initiated: 02/03/2018 Target Resolution Date: 04/15/2018 Goal Status: Active Interventions: Provide education on orientation to the wound center Notes: ` Wound/Skin Impairment Nursing Diagnoses: Impaired tissue integrity Goals: Ulcer/skin breakdown will heal within 14 weeks Date Initiated: 02/03/2018 Target Resolution Date: 04/15/2018 Goal Status: Active Interventions: MAIZE, [place] ([id number]) Assess patient/caregiver ability to obtain necessary supplies Assess patient/caregiver  ability to perform ulcer/skin care regimen upon admission and as needed Assess ulceration([name]) every  visit Notes: Electronic Signature([name]) Signed: 02/24/2018 4:49:47 PM By: Montey Hora Entered By: Montey Hora on 02/24/2018 15:32:15 Sianez, Eula Meyers Kitchen ([id number]) -------------------------------------------------------------------------------- Pain Assessment Details Patient Name: [name] Date of Service: 02/24/2018 3:00 PM Medical Record Number: [id number] Patient Account Number: [id number] Date of Birth/Sex: [birth year] (84 y.o. F) Treating RN: Roger Shelter Primary Care Martasia Talamante: Fulton Reek Other Clinician: Referring Topher Buenaventura: Fulton Reek Treating Khalin Royce/Extender: Melburn Hake, HOYT Weeks in Treatment: 3 Active Problems Location of Pain Severity and Description of Pain Patient Has Paino No Site Locations Pain Management and Medication Current Pain Management: Electronic Signature([name]) Signed: 02/24/2018 4:42:38 PM By: Roger Shelter Entered By: Roger Shelter on 02/24/2018 14:58:47 Yun, [name] ([id number]) -------------------------------------------------------------------------------- Patient/Caregiver Education Details Patient Name: [name] Date of Service: 02/24/2018 3:00 PM Medical Record Number: [id number] Patient Account Number: [id number] Date of Birth/Gender: Jul 18, 1923 (84 y.o. F) Treating RN: Cornell Barman Primary Care Physician: Fulton Reek Other Clinician: Referring Physician: Fulton Reek Treating Physician/Extender: Sharalyn Ink in Treatment: 3 Education Assessment Education Provided To: Patient Education Topics Provided Venous: Handouts: Controlling Swelling with Multilayered Compression Wraps Methods: Demonstration, Explain/Verbal Responses: State content correctly Wound/Skin Impairment: Handouts: Caring for Your Ulcer Methods: Demonstration, Explain/Verbal Responses: State content correctly Electronic Signature([name]) Signed: 02/24/2018 4:42:56 PM By: Gretta Cool, BSN, RN, CWS, Kim RN, BSN Entered By: Gretta Cool, BSN, RN,  CWS, Kim on 02/24/2018 15:44:48 [name], [name] ([id number]) -------------------------------------------------------------------------------- Wound Assessment Details Patient Name: [name] Date of Service: 02/24/2018 3:00 PM Medical Record Number: [id number] Patient Account Number: [id number] Date of Birth/Sex: [birth year] (84 y.o. F) Treating RN: Roger Shelter Primary Care Oney Folz: Fulton Reek Other Clinician: Referring Shiya Fogelman: Fulton Reek Treating Deloma Spindle/Extender: Melburn Hake, HOYT Weeks in Treatment: 3 Wound Status Wound Number: 2 Primary Trauma, Other Etiology: Wound Location: Left Forearm Wound Open Wounding Event: Trauma Status: Date Acquired: 02/01/2018 Comorbid Cataracts, Chronic Obstructive Pulmonary Weeks Of Treatment: 3 History: Disease (COPD), Deep Vein Thrombosis, Clustered Wound: No Hypertension, Myocardial Infarction, Osteoarthritis Photos Photo Uploaded By: Roger Shelter on 02/24/2018 16:40:13 Wound Measurements Length: (cm) 3.4 Width: (cm) 3.2 Depth: (cm) 0.2 Area: (cm) 8.545 Volume: (cm) 1.709 % Reduction in Area: -19.6% % Reduction in Volume: -139% Epithelialization: Small (1-33%) Tunneling: No Undermining: No Wound Description Classification: Partial Thickness Wound Margin: Indistinct, nonvisible Exudate Amount: Large Exudate Type: Serosanguineous Exudate Color: red, brown Foul Odor After Cleansing: No Slough/Fibrino No Wound Bed Granulation Amount: Large (67-100%) Exposed Structure Granulation Quality: Red Fascia Exposed: No Necrotic Amount: Small (1-33%) Fat Layer (Subcutaneous Tissue) Exposed: Yes Necrotic Quality: Eschar, Adherent Slough Tendon Exposed: No Muscle Exposed: No Joint Exposed: No Bone Exposed: No [name], Ann E. ([id number]) Periwound Skin Texture Texture Color No Abnormalities Noted: No No Abnormalities Noted: No Callus: No Atrophie Blanche: No Crepitus: No Cyanosis: No Excoriation:  No Ecchymosis: Yes Induration: No Erythema: No Rash: No Hemosiderin Staining: No Scarring: No Mottled: No Pallor: No Moisture Rubor: No No Abnormalities Noted: No Dry / Scaly: No Temperature / Pain Maceration: No Temperature: No Abnormality Tenderness on Palpation: Yes Wound Preparation Ulcer Cleansing: Rinsed/Irrigated with Saline Topical Anesthetic Applied: Other: lidocaine 4%, Treatment Notes Wound #2 (Left Forearm) 1. Cleansed with: Clean wound with Normal Saline 2. Anesthetic Topical Lidocaine 4% cream to wound bed prior to debridement 4. Dressing Applied: Xeroform 5. Secondary Imbery Notes conform and netting Electronic Signature([name]) Signed: 02/24/2018 4:42:38 PM By: Roger Shelter Entered By: Roger Shelter on 02/24/2018  15:16:04 [name], [name] ([id number]) -------------------------------------------------------------------------------- Wound Assessment Details Patient Name: [name], [name] Date of Service: 02/24/2018 3:00 PM Medical Record Number: [id number] Patient Account Number: [id number] Date of Birth/Sex: 07-Oct-1922 (84 y.o. F) Treating RN: Roger Shelter Primary Care Buel Molder: Fulton Reek Other Clinician: Referring Maylani Embree: Fulton Reek Treating Loukas Antonson/Extender: Melburn Hake, HOYT Weeks in Treatment: 3 Wound Status Wound Number: 3 Primary Venous Leg Ulcer Etiology: Wound Location: Left Lower Leg - Posterior Wound Open Wounding Event: Gradually Appeared Status: Date Acquired: 12/05/2017 Comorbid Cataracts, Chronic Obstructive Pulmonary Weeks Of Treatment: 3 History: Disease (COPD), Deep Vein Thrombosis, Clustered Wound: No Hypertension, Myocardial Infarction, Osteoarthritis Photos Photo Uploaded By: Roger Shelter on 02/24/2018 16:40:28 Wound Measurements Length: (cm) 3 Width: (cm) 1.5 Depth: (cm) 0.1 Area: (cm) 3.534 Volume: (cm) 0.353 % Reduction in Area: 10% % Reduction in Volume:  55% Epithelialization: Small (1-33%) Tunneling: No Undermining: No Wound Description Full Thickness Without Exposed Support Classification: Structures Wound Margin: Indistinct, nonvisible Exudate Large Amount: Exudate Type: Serous Exudate Color: amber Foul Odor After Cleansing: No Slough/Fibrino Yes Wound Bed Granulation Amount: Medium (34-66%) Exposed Structure Granulation Quality: Red, Pink Fascia Exposed: No Necrotic Amount: Medium (34-66%) Fat Layer (Subcutaneous Tissue) Exposed: Yes Necrotic Quality: Adherent Slough Tendon Exposed: No Muscle Exposed: No Joint Exposed: No Nicastro, Berneta E. ([id number]) Bone Exposed: No Periwound Skin Texture Texture Color No Abnormalities Noted: No No Abnormalities Noted: No Callus: No Atrophie Blanche: No Crepitus: No Cyanosis: No Excoriation: No Ecchymosis: No Induration: No Erythema: No Rash: No Hemosiderin Staining: No Scarring: Yes Mottled: No Pallor: No Moisture Rubor: No No Abnormalities Noted: No Dry / Scaly: No Temperature / Pain Maceration: Yes Temperature: No Abnormality Tenderness on Palpation: Yes Wound Preparation Ulcer Cleansing: Rinsed/Irrigated with Saline Topical Anesthetic Applied: Other: lidociane 4%, Treatment Notes Wound #2 (Left Forearm) 1. Cleansed with: Clean wound with Normal Saline 2. Anesthetic Topical Lidocaine 4% cream to wound bed prior to debridement 4. Dressing Applied: Xeroform 5. Secondary Pritchett Notes conform with netting Electronic Signature([name]) Signed: 02/24/2018 4:42:38 PM By: Roger Shelter Entered By: Roger Shelter on 02/24/2018 15:16:36 Mcgovern, [name] ([id number]) -------------------------------------------------------------------------------- Vitals Details Patient Name: [name] Date of Service: 02/24/2018 3:00 PM Medical Record Number: [id number] Patient Account Number: [id number] Date of Birth/Sex: 15-Feb-1923 (84 y.o. F) Treating  RN: Roger Shelter Primary Care Kannan Proia: Fulton Reek Other Clinician: Referring Simonne Boulos: Fulton Reek Treating Valgene Deloatch/Extender: Melburn Hake, HOYT Weeks in Treatment: 3 Vital Signs Time Taken: 14:58 Temperature (F): 98.3 Height (in): 66 Pulse (bpm): 70 Weight (lbs): 134 Respiratory Rate (breaths/min): 18 Body Mass Index (BMI): 21.6 Blood Pressure (mmHg): 128/54 Reference Range: 80 - 120 mg / dl Electronic Signature([name]) Signed: 02/24/2018 4:42:38 PM By: Roger Shelter Entered By: Roger Shelter on 02/24/2018 14:59:08

## 2018-02-28 NOTE — Progress Notes (Signed)
MAYO, OWCZARZAK ([id number]) Visit Report for 02/24/2018 Chief Complaint Document Details Patient Name: [name], [name] Date of Service: 02/24/2018 3:00 PM Medical Record Number: [id number] Patient Account Number: [id number] Date of Birth/Sex: October 28, 1922 (84 y.o. F) Treating RN: Montey Hora Primary Care Provider: Fulton Reek Other Clinician: Referring Provider: Fulton Reek Treating Provider/Extender: Melburn Hake,  Weeks in Treatment: 3 Information Obtained from: Patient Chief Complaint Left forearm wound and left lower extremity ulcer Electronic Signature(s) Signed: 02/27/2018 12:55:36 AM By: Worthy Keeler PA-C Entered By: Worthy Keeler on 02/24/2018 15:18:20 Gonder, Charlyne Quale ([id number]) -------------------------------------------------------------------------------- HPI Details Patient Name: [name] Date of Service: 02/24/2018 3:00 PM Medical Record Number: [id number] Patient Account Number: [id number] Date of Birth/Sex: [birth year] (84 y.o. F) Treating RN: Montey Hora Primary Care Provider: Fulton Reek Other Clinician: Referring Provider: Fulton Reek Treating Provider/Extender: Melburn Hake,  Weeks in Treatment: 3 History of Present Illness HPI Description: 02/03/18 on evaluation today patient presents for initial evaluation and our clinic is referral from [facility] ER. She sustained a skin tear on Wednesday of this week, two days ago to the left forearm. Subsequently she is on Plavix due to a history of TIAs. Subsequently she does have a fairly significant bruising to the left forearm. This occurred when she was walking in her garden and her caretaker who was with her noted that she began to fall and attempted to grab her in order to prevent the fall subsequently causing a skin tear on the left forearm. With that being said this is causing some discomfort to the patient she tells me. In regard to left lateral cath ulcer this is  something that arose gradually 6 to 8 weeks ago. She has seen dermatology they recommended Vaseline. Subsequently she saw her cardiologist and they recommended triple antibiotic ointment. Unfortunately neither has really improved her overall wound status at this point. She has been tolerating the Steri-Strips and dressing changes to left forearm and again no dressing has really been applied to the left lateral lower extremity. She does have bilateral lower extremity edema. 02/10/18-She is here in follow-up evaluation for a left posterior calf ulcer and left forearm skin tear. The remaining Steri-Strips were removed from the left forearm, there is residual hematoma that was incised and drained. She tolerated debridement to the posterior calf. We will increase compression and she will follow-up next week 02/17/18 on evaluation today patient appears to be doing rather well in regard to her left upper extremity ulcer. The skin tear is actually healing quite nicely in the majority possibly even 90% of the skin reattached on inspection today. This is obviously excellent news. With that being said her left lateral lower Trinity also is continuing to do well the Iodoflex does seem to be beneficial. She's been tolerating this without any significant discomfort at this point she tells me. 02/24/18 on evaluation today patient appears to be doing very well in regard to her left forearm ulcer as well as her left lateral lower extremity ulcer. She has been tolerating the dressing changes without complication including the wrap. At this point I think that she's showing signs of excellent healing and again there's no evidence of infection. Electronic Signature(s) Signed: 02/27/2018 12:55:36 AM By: Worthy Keeler PA-C Entered By: Worthy Keeler on 02/24/2018 17:06:28 NEVEEN, DAPONTE ([id number]) -------------------------------------------------------------------------------- Physical Exam Details Patient Name: [name] Date of Service: 02/24/2018 3:00 PM Medical Record Number: [id number] Patient Account Number: [id number] Date of Birth/Sex: October 30, 1922 (84  y.o. F) Treating RN: Montey Hora Primary Care Provider: Fulton Reek Other Clinician: Referring Provider: Fulton Reek Treating Provider/Extender: Melburn Hake,  Weeks in Treatment: 3 Constitutional Well-nourished and well-hydrated in no acute distress. Respiratory normal breathing without difficulty. clear to auscultation bilaterally. Cardiovascular regular rate and rhythm with normal S1, S2. Psychiatric this patient is able to make decisions and demonstrates good insight into disease process. Alert and Oriented x 3. pleasant and cooperative. Notes Patient's wounds both appear to show a good granular surface at this point and overall I'm extremely pleased with how things are progressing. She likewise states that the pain is not really significant she has little irritation especially the left lower extremity with cleansing but other than this there's no major problems that she's experiencing at this point. Obscene this is excellent news. Electronic Signature(s) Signed: 02/27/2018 12:55:36 AM By: Worthy Keeler PA-C Entered By: Worthy Keeler on 02/24/2018 17:07:32 Corbo, Charlyne Quale ([id number]) -------------------------------------------------------------------------------- Physician Orders Details Patient Name: [name] Date of Service: 02/24/2018 3:00 PM Medical Record Number: [id number] Patient Account Number: [id number] Date of Birth/Sex: 1923/06/11 (84 y.o. F) Treating RN: Montey Hora Primary Care Provider: Fulton Reek Other Clinician: Referring Provider: Fulton Reek Treating Provider/Extender: Melburn Hake,  Weeks in Treatment: 3 Verbal / Phone Orders: No Diagnosis Coding ICD-10 Coding Code Description I87.312 Chronic venous hypertension (idiopathic) with ulcer of left lower extremity L97.222  Non-pressure chronic ulcer of left calf with fat layer exposed S51.802S Unspecified open wound of left forearm, sequela I25.10 Atherosclerotic heart disease of native coronary artery without angina pectoris G45.9 Transient cerebral ischemic attack, unspecified Z79.01 Long term (current) use of anticoagulants J44.9 Chronic obstructive pulmonary disease, unspecified Z95.0 Presence of cardiac pacemaker Wound Cleansing Wound #2 Left Forearm o Clean wound with Normal Saline. Wound #3 Left,Posterior Lower Leg o Cleanse wound with mild soap and water Primary Wound Dressing Wound #2 Left Forearm o Xeroform Wound #3 Left,Posterior Lower Leg o Iodoflex Secondary Dressing Wound #2 Left Forearm o ABD and Kerlix/Conform - wrap lightly with conforming rolled gauze and secure with netting Wound #3 Left,Posterior Lower Leg o ABD pad Dressing Change Frequency Wound #2 Left Forearm o Change Dressing Monday, Wednesday, Friday Wound #3 Left,Posterior Lower Leg o Change Dressing Monday, Wednesday, Friday Follow-up Appointments Wound #2 Left Forearm Camera, Eldonna E. ([id number]) o Return Appointment in 1 week. Wound #3 Left,Posterior Lower Leg o Return Appointment in 1 week. Edema Control Wound #3 Left,Posterior Lower Leg o 3 Layer Compression System - Left Lower Extremity Home Health Wound #2 Left Forearm o [place] Nurse may visit PRN to address patientos wound care needs. o FACE TO FACE ENCOUNTER: MEDICARE and MEDICAID PATIENTS: I certify that this patient is under my care and that I had a face-to-face encounter that meets the physician face-to-face encounter requirements with this patient on this date. The encounter with the patient was in whole or in part for the following MEDICAL CONDITION: (primary reason for [place]) MEDICAL NECESSITY: I certify, that based on my findings, NURSING services are a medically necessary home  health service. HOME BOUND STATUS: I certify that my clinical findings support that this patient is homebound (i.e., Due to illness or injury, pt requires aid of supportive devices such as crutches, cane, wheelchairs, walkers, the use of special transportation or the assistance of another person to leave their place of residence. There is a normal inability to leave the home and doing so requires considerable and taxing effort. Other  absences are for medical reasons / religious services and are infrequent or of short duration when for other reasons). o If current dressing causes regression in wound condition, may D/C ordered dressing product/s and apply Normal Saline Moist Dressing daily until next [place] / Other MD appointment. [place] of regression in wound condition at [phone]. o Please direct any NON-WOUND related issues/requests for orders to patient's Primary Care Physician Wound #3 [place] Nurse may visit PRN to address patientos wound care needs. o FACE TO FACE ENCOUNTER: MEDICARE and MEDICAID PATIENTS: I certify that this patient is under my care and that I had a face-to-face encounter that meets the physician face-to-face encounter requirements with this patient on this date. The encounter with the patient was in whole or in part for the following MEDICAL CONDITION: (primary reason for [place]) MEDICAL NECESSITY: I certify, that based on my findings, NURSING services are a medically necessary home health service. HOME BOUND STATUS: I certify that my clinical findings support that this patient is homebound (i.e., Due to illness or injury, pt requires aid of supportive devices such as crutches, cane, wheelchairs, walkers, the use of special transportation or the assistance of another person to leave their place of residence. There is a normal inability to leave the  home and doing so requires considerable and taxing effort. Other absences are for medical reasons / religious services and are infrequent or of short duration when for other reasons). o If current dressing causes regression in wound condition, may D/C ordered dressing product/s and apply Normal Saline Moist Dressing daily until next [place] / Other MD appointment. [place] of regression in wound condition at [phone]. o Please direct any NON-WOUND related issues/requests for orders to patient's Primary Care Physician Electronic Signature(s) Signed: 02/24/2018 4:49:47 PM By: Montey Hora Signed: 02/27/2018 12:55:36 AM By: Worthy Keeler PA-C Entered By: Montey Hora on 02/24/2018 15:40:05 Tayler, Charlyne Quale ([id number]) -------------------------------------------------------------------------------- Problem List Details Patient Name: [name] Date of Service: 02/24/2018 3:00 PM Medical Record Number: [id number] Patient Account Number: [id number] Date of Birth/Sex: 09/04/1923 (84 y.o. F) Treating RN: Montey Hora Primary Care Provider: Fulton Reek Other Clinician: Referring Provider: Fulton Reek Treating Provider/Extender: Melburn Hake,  Weeks in Treatment: 3 Active Problems ICD-10 Evaluated Encounter Code Description Active Date Today Diagnosis I87.312 Chronic venous hypertension (idiopathic) with ulcer of left 02/03/2018 No Yes lower extremity L97.222 Non-pressure chronic ulcer of left calf with fat layer exposed 02/03/2018 No Yes S51.802S Unspecified open wound of left forearm, sequela 02/03/2018 No Yes I25.10 Atherosclerotic heart disease of native coronary artery 02/03/2018 No Yes without angina pectoris G45.9 Transient cerebral ischemic attack, unspecified 02/03/2018 No Yes Z79.01 Long term (current) use of anticoagulants 02/03/2018 No Yes J44.9 Chronic obstructive pulmonary disease, unspecified 02/03/2018 No Yes Z95.0 Presence  of cardiac pacemaker 02/03/2018 No Yes Inactive Problems Resolved Problems Electronic Signature(s) Signed: 02/27/2018 12:55:36 AM By: Mena Goes, Jaydeen E. ([id number]) Entered By: Worthy Keeler on 02/24/2018 15:18:13 Azevedo, Bell EMarland Kitchen ([id number]) -------------------------------------------------------------------------------- Progress Note Details Patient Name: [name] Date of Service: 02/24/2018 3:00 PM Medical Record Number: [id number] Patient Account Number: [id number] Date of Birth/Sex: [birth year] (84 y.o. F) Treating RN: Montey Hora Primary Care Provider: Fulton Reek Other Clinician: Referring Provider: Fulton Reek Treating Provider/Extender: Melburn Hake,  Weeks in Treatment: 3 Subjective Chief Complaint Information obtained from Patient Left forearm wound and left lower extremity  ulcer History of Present Illness (HPI) 02/03/18 on evaluation today patient presents for initial evaluation and our clinic [facility]. She sustained a skin tear on Wednesday of this week, two days ago to the left forearm. Subsequently she is on Plavix due to a history of TIAs. Subsequently she does have a fairly significant bruising to the left forearm. This occurred when she was walking in her garden and her caretaker who was with her noted that she began to fall and attempted to grab her in order to prevent the fall subsequently causing a skin tear on the left forearm. With that being said this is causing some discomfort to the patient she tells me. In regard to left lateral cath ulcer this is something that arose gradually 6 to 8 weeks ago. She has seen dermatology they recommended Vaseline. Subsequently she saw her cardiologist and they recommended triple antibiotic ointment. Unfortunately neither has really improved her overall wound status at this point. She has been tolerating the Steri-Strips and dressing changes to left  forearm and again no dressing has really been applied to the left lateral lower extremity. She does have bilateral lower extremity edema. 02/10/18-She is here in follow-up evaluation for a left posterior calf ulcer and left forearm skin tear. The remaining Steri-Strips were removed from the left forearm, there is residual hematoma that was incised and drained. She tolerated debridement to the posterior calf. We will increase compression and she will follow-up next week 02/17/18 on evaluation today patient appears to be doing rather well in regard to her left upper extremity ulcer. The skin tear is actually healing quite nicely in the majority possibly even 90% of the skin reattached on inspection today. This is obviously excellent news. With that being said her left lateral lower Trinity also is continuing to do well the Iodoflex does seem to be beneficial. She's been tolerating this without any significant discomfort at this point she tells me. 02/24/18 on evaluation today patient appears to be doing very well in regard to her left forearm ulcer as well as her left lateral lower extremity ulcer. She has been tolerating the dressing changes without complication including the wrap. At this point I think that she's showing signs of excellent healing and again there's no evidence of infection. Patient History Information obtained from Patient. Family History Cancer - Child, Hypertension - Child, Thyroid Problems - Child, No family history of Diabetes, Heart Disease, Kidney Disease, Lung Disease, Seizures, Stroke, Tuberculosis. Social History Never smoker, Marital Status - Widowed, Alcohol Use - Never, Drug Use - No History, Caffeine Use - Daily. Medical History Hospitalization/Surgery History - 12/19/2017, [facility], TIA. Medical And Surgical History Notes Respiratory PE 2007 DERIAN, PFOST ([id number]) Cardiovascular pacemaker placed 01/2011 Gastrointestinal GERD; diverticuitis; colon spams;  appendectomy 1935 Musculoskeletal scoliosis; bulging discs Neurologic TIA's 2007,2013,2014 Oncologic Skin CA- squamous cell carcinoma 2014 R leg; mult. breast biopsy (most recent 2004)- no CA Review of Systems (ROS) Constitutional Symptoms (General Health) Denies complaints or symptoms of Fever, Chills. Respiratory The patient has no complaints or symptoms. Cardiovascular Complains or has symptoms of LE edema. Psychiatric The patient has no complaints or symptoms. Objective Constitutional Well-nourished and well-hydrated in no acute distress. Vitals Time Taken: 2:58 PM, Height: 66 in, Weight: 134 lbs, BMI: 21.6, Temperature: 98.3 F, Pulse: 70 bpm, Respiratory Rate: 18 breaths/min, Blood Pressure: 128/54 mmHg. Respiratory normal breathing without difficulty. clear to auscultation bilaterally. Cardiovascular regular rate and rhythm with normal S1, S2. Psychiatric this  patient is able to make decisions and demonstrates good insight into disease process. Alert and Oriented x 3. pleasant and cooperative. General Notes: Patient's wounds both appear to show a good granular surface at this point and overall I'm extremely pleased with how things are progressing. She likewise states that the pain is not really significant she has little irritation especially the left lower extremity with cleansing but other than this there's no major problems that she's experiencing at this point. Obscene this is excellent news. Integumentary (Hair, Skin) Wound #2 status is Open. Original cause of wound was Trauma. The wound is located on the Left Forearm. The wound measures 3.4cm length x 3.2cm width x 0.2cm depth; 8.545cm^2 area and 1.709cm^3 volume. There is Fat Layer (Subcutaneous Tissue) Exposed exposed. There is no tunneling or undermining noted. There is a large amount of serosanguineous drainage noted. The wound margin is indistinct and nonvisible. There is large (67-100%) red granulation Wilinski,  Tyrina E. ([id number]) within the wound bed. There is a small (1-33%) amount of necrotic tissue within the wound bed including Eschar and Adherent Slough. The periwound skin appearance exhibited: Ecchymosis. The periwound skin appearance did not exhibit: Callus, Crepitus, Excoriation, Induration, Rash, Scarring, Dry/Scaly, Maceration, Atrophie Blanche, Cyanosis, Hemosiderin Staining, Mottled, Pallor, Rubor, Erythema. Periwound temperature was noted as No Abnormality. The periwound has tenderness on palpation. Wound #3 status is Open. Original cause of wound was Gradually Appeared. The wound is located on the Left,Posterior Lower Leg. The wound measures 3cm length x 1.5cm width x 0.1cm depth; 3.534cm^2 area and 0.353cm^3 volume. There is Fat Layer (Subcutaneous Tissue) Exposed exposed. There is no tunneling or undermining noted. There is a large amount of serous drainage noted. The wound margin is indistinct and nonvisible. There is medium (34-66%) red, pink granulation within the wound bed. There is a medium (34-66%) amount of necrotic tissue within the wound bed including Adherent Slough. The periwound skin appearance exhibited: Scarring, Maceration. The periwound skin appearance did not exhibit: Callus, Crepitus, Excoriation, Induration, Rash, Dry/Scaly, Atrophie Blanche, Cyanosis, Ecchymosis, Hemosiderin Staining, Mottled, Pallor, Rubor, Erythema. Periwound temperature was noted as No Abnormality. The periwound has tenderness on palpation. Assessment Active Problems ICD-10 Chronic venous hypertension (idiopathic) with ulcer of left lower extremity Non-pressure chronic ulcer of left calf with fat layer exposed Unspecified open wound of left forearm, sequela Atherosclerotic heart disease of native coronary artery without angina pectoris Transient cerebral ischemic attack, unspecified Long term (current) use of anticoagulants Chronic obstructive pulmonary disease, unspecified Presence of  cardiac pacemaker Procedures Wound #3 Pre-procedure diagnosis of Wound #3 is a Venous Leg Ulcer located on the Left,Posterior Lower Leg . There was a Three Layer Compression Therapy Procedure with a pre-treatment ABI of 1.2 by Montey Hora, RN. Post procedure Diagnosis Wound #3: Same as Pre-Procedure Plan Wound Cleansing: Wound #2 Left Forearm: Clean wound with Normal Saline. Wound #3 Left,Posterior Lower Leg: Cleanse wound with mild soap and water Primary Wound Dressing: Wound #2 Left Forearm: Verhoeven, Khilee E. ([id number]) Xeroform Wound #3 Left,Posterior Lower Leg: Iodoflex Secondary Dressing: Wound #2 Left Forearm: ABD and Kerlix/Conform - wrap lightly with conforming rolled gauze and secure with netting Wound #3 Left,Posterior Lower Leg: ABD pad Dressing Change Frequency: Wound #2 Left Forearm: Change Dressing Monday, Wednesday, Friday Wound #3 Left,Posterior Lower Leg: Change Dressing Monday, Wednesday, Friday Follow-up Appointments: Wound #2 Left Forearm: Return Appointment in 1 week. Wound #3 Left,Posterior Lower Leg: Return Appointment in 1 week. Edema Control: Wound #3 Left,Posterior Lower Leg: 3 Layer Compression System -  Left Lower Extremity Home Health: Wound #2 Left Forearm: [place] Nurse may visit PRN to address patient s wound care needs. FACE TO FACE ENCOUNTER: MEDICARE and MEDICAID PATIENTS: I certify that this patient is under my care and that I had a face-to-face encounter that meets the physician face-to-face encounter requirements with this patient on this date. The encounter with the patient was in whole or in part for the following MEDICAL CONDITION: (primary reason for [place]) MEDICAL NECESSITY: I certify, that based on my findings, NURSING services are a medically necessary home health service. HOME BOUND STATUS: I certify that my clinical findings support that this patient is homebound (i.e., Due  to illness or injury, pt requires aid of supportive devices such as crutches, cane, wheelchairs, walkers, the use of special transportation or the assistance of another person to leave their place of residence. There is a normal inability to leave the home and doing so requires considerable and taxing effort. Other absences are for medical reasons / religious services and are infrequent or of short duration when for other reasons). If current dressing causes regression in wound condition, may D/C ordered dressing product/s and apply Normal Saline Moist Dressing daily until next [place] / Other MD appointment. [place] of regression in wound condition at [phone]. Please direct any NON-WOUND related issues/requests for orders to patient's Primary Care Physician Wound #3 Left,Posterior Lower Leg: [place] Nurse may visit PRN to address patient s wound care needs. FACE TO FACE ENCOUNTER: MEDICARE and MEDICAID PATIENTS: I certify that this patient is under my care and that I had a face-to-face encounter that meets the physician face-to-face encounter requirements with this patient on this date. The encounter with the patient was in whole or in part for the following MEDICAL CONDITION: (primary reason for [place]) MEDICAL NECESSITY: I certify, that based on my findings, NURSING services are a medically necessary home health service. HOME BOUND STATUS: I certify that my clinical findings support that this patient is homebound (i.e., Due to illness or injury, pt requires aid of supportive devices such as crutches, cane, wheelchairs, walkers, the use of special transportation or the assistance of another person to leave their place of residence. There is a normal inability to leave the home and doing so requires considerable and taxing effort. Other absences are for medical reasons / religious services and are infrequent or of  short duration when for other reasons). If current dressing causes regression in wound condition, may D/C ordered dressing product/s and apply Normal Saline Moist Dressing daily until next [place] / Other MD appointment. [place] of regression in wound condition at [phone]. Please direct any NON-WOUND related issues/requests for orders to patient's Primary Care Physician LATRISE, BOWLAND ([id number]) I am going to suggest at this point that we actually continue with the Current wound care measures for the next week. She seems to be tolerating this well and making good progress which is what we expect and want. We will see her for reevaluation and see were things stand. Please see above for specific wound care orders. We will see patient for re-evaluation in 1 week(s) here in the clinic. If anything worsens or changes patient will contact our office for additional recommendations. Electronic Signature(s) Signed: 02/27/2018 12:55:36 AM By: Worthy Keeler PA-C Entered By: Worthy Keeler on 02/24/2018 17:08:16 Blomgren, Dyamond EMarland Kitchen ([id number]) -------------------------------------------------------------------------------- ROS/PFSH Details Patient Name: [place],  Lianne E. Date of Service: 02/24/2018 3:00 PM Medical Record Number: [id number] Patient Account Number: [id number] Date of Birth/Sex: 1923-02-14 (84 y.o. F) Treating RN: Montey Hora Primary Care Provider: Fulton Reek Other Clinician: Referring Provider: Fulton Reek Treating Provider/Extender: Melburn Hake,  Weeks in Treatment: 3 Information Obtained From Patient Wound History Do you currently have one or more open woundso Yes How many open wounds do you currently haveo 2 Approximately how long have you had your woundso 1 month How have you been treating your wound(s) until nowo neosporin Has your wound(s) ever healed and then re-openedo No Have you had any lab work done in the past montho  No Have you tested positive for an antibiotic resistant organism (MRSA, VRE)o No Have you tested positive for osteomyelitis (bone infection)o No Have you had any tests for circulation on your legso No Constitutional Symptoms (General Health) Complaints and Symptoms: Negative for: Fever; Chills Cardiovascular Complaints and Symptoms: Positive for: LE edema Medical History: Positive for: Deep Vein Thrombosis - 03/2006 R leg; Hypertension; Myocardial Infarction - 09/2005 Negative for: Angina; Arrhythmia; Congestive Heart Failure; Coronary Artery Disease; Hypotension; Peripheral Arterial Disease; Peripheral Venous Disease; Phlebitis; Vasculitis Past Medical History Notes: pacemaker placed 01/2011 Eyes Medical History: Positive for: Cataracts - removed 2004 from R eye and L eye 2009 and 2014 Negative for: Glaucoma; Optic Neuritis Respiratory Complaints and Symptoms: No Complaints or Symptoms Medical History: Positive for: Chronic Obstructive Pulmonary Disease (COPD) Negative for: Aspiration; Asthma; Pneumothorax; Sleep Apnea; Tuberculosis Past Medical History Notes: PE 2007 Gastrointestinal KARYSA, HEFT. ([id number]) Medical History: Negative for: Cirrhosis ; Colitis; Crohnos; Hepatitis A; Hepatitis B; Hepatitis C Past Medical History Notes: GERD; diverticuitis; colon spams; appendectomy 1935 Endocrine Medical History: Negative for: Type I Diabetes; Type II Diabetes Genitourinary Medical History: Negative for: End Stage Renal Disease Immunological Medical History: Negative for: Lupus Erythematosus; Raynaudos; Scleroderma Integumentary (Skin) Medical History: Negative for: History of Burn; History of pressure wounds Musculoskeletal Medical History: Positive for: Osteoarthritis Negative for: Gout; Rheumatoid Arthritis; Osteomyelitis Past Medical History Notes: scoliosis; bulging discs Neurologic Medical History: Negative for: Dementia; Neuropathy; Quadriplegia; Seizure  Disorder Past Medical History Notes: TIA's 2007,2013,2014 Oncologic Medical History: Negative for: Received Chemotherapy; Received Radiation Past Medical History Notes: Skin CA- squamous cell carcinoma 2014 R leg; mult. breast biopsy (most recent 2004)- no CA Psychiatric Complaints and Symptoms: No Complaints or Symptoms Medical History: Negative for: Anorexia/bulimia; Confinement Anxiety HBO Extended History Items Eyes: Cataracts Deignan, Aloise E. ([id number]) Immunizations Pneumococcal Vaccine: Received Pneumococcal Vaccination: Yes Tetanus Vaccine: Last tetanus shot: 03/29/2012 Immunization Notes: Flu Shot 10/15; Pneumona 2013 Implantable Devices Hospitalization / Surgery History Name of Hospital Purpose of Hospitalization/Surgery Date [place] TIA 12/19/2017 Family and Social History Cancer: Yes - Child; Diabetes: No; Heart Disease: No; Hypertension: Yes - Child; Kidney Disease: No; Lung Disease: No; Seizures: No; Stroke: No; Thyroid Problems: Yes - Child; Tuberculosis: No; Never smoker; Marital Status - Widowed; Alcohol Use: Never; Drug Use: No History; Caffeine Use: Daily; Financial Concerns: No; Food, Clothing or Shelter Needs: No; Support System Lacking: No; Transportation Concerns: No; Advanced Directives: Yes (Not Provided); Patient does not want information on Advanced Directives; Do not resuscitate: Yes (Not Provided); Living Will: Yes (Not Provided); Medical Power of Attorney: Yes - Williams Che- daughter (Not Provided) Physician Affirmation I have reviewed and agree with the above information. Electronic Signature(s) Signed: 02/27/2018 12:55:36 AM By: Worthy Keeler PA-C Signed: 02/27/2018 4:53:29 PM By: Montey Hora Entered By: Worthy Keeler on 02/24/2018 17:07:09 Cihlar, Patsye E. ([id number]) --------------------------------------------------------------------------------  SuperBill Details Patient Name: BAO, COREAS Date of Service: 02/24/2018 Medical Record  Number: [id number] Patient Account Number: [id number] Date of Birth/Sex: May 31, 1923 (84 y.o. F) Treating RN: Montey Hora Primary Care Provider: Fulton Reek Other Clinician: Referring Provider: Fulton Reek Treating Provider/Extender: Melburn Hake,  Weeks in Treatment: 3 Diagnosis Coding ICD-10 Codes Code Description [phone] Chronic venous hypertension (idiopathic) with ulcer of left lower extremity L97.222 Non-pressure chronic ulcer of left calf with fat layer exposed S51.802S Unspecified open wound of left forearm, sequela I25.10 Atherosclerotic heart disease of native coronary artery without angina pectoris G45.9 Transient cerebral ischemic attack, unspecified Z79.01 Long term (current) use of anticoagulants J44.9 Chronic obstructive pulmonary disease, unspecified Z95.0 Presence of cardiac pacemaker Facility Procedures CPT4 Code: [id number] Description: (Facility Use Only) [phone] - [facility] LWR LT LEG Modifier: Quantity: 1 Physician Procedures CPT4 Code Description: [id number] - WC PHYS LEVEL 3 - EST PT ICD-10 Diagnosis Description I87.312 Chronic venous hypertension (idiopathic) with ulcer of left lo L97.222 Non-pressure chronic ulcer of left calf with fat layer exposed S51.802S  Unspecified open wound of left forearm, sequela I25.10 Atherosclerotic heart disease of native coronary artery withou Modifier: wer extremity t angina pectori Quantity: 1 s Electronic Signature(s) Signed: 02/27/2018 12:55:36 AM By: Worthy Keeler PA-C Previous Signature: 02/24/2018 4:49:47 PM Version By: Montey Hora Entered By: Worthy Keeler on 02/24/2018 17:08:35

## 2018-03-03 ENCOUNTER — Encounter: Payer: Medicare Other | Admitting: Physician Assistant

## 2018-03-03 DIAGNOSIS — L97222 Non-pressure chronic ulcer of left calf with fat layer exposed: Secondary | ICD-10-CM | POA: Diagnosis not present

## 2018-03-05 NOTE — Progress Notes (Signed)
Ann, Meyers ([id number]) Visit Report for 03/03/2018 Arrival Information Details Patient Name: [name], [name] Date of Service: 03/03/2018 3:15 PM Medical Record Number: [id number] Patient Account Number: [id number] Date of Birth/Sex: [birth year] (84 y.o. F) Treating RN: Roger Shelter Primary Care Cloys Vera: Fulton Reek Other Clinician: Referring Chatara Lucente: Fulton Reek Treating Mandalyn Pasqua/Extender: Melburn Hake, HOYT Weeks in Treatment: 4 Visit Information History Since Last Visit All ordered tests and consults were completed: No Patient Arrived: Gilford Rile Added or deleted any medications: No Arrival Time: 15:17 Any new allergies or adverse reactions: No Accompanied By: daughter Had a fall or experienced change in No Transfer Assistance: None activities of daily living that may affect Patient Identification Verified: Yes risk of falls: Secondary Verification Process Yes Signs or symptoms of abuse/neglect since last visito No Completed: Hospitalized since last visit: No Patient Has Alerts: Yes Implantable device outside of the clinic excluding No Patient Alerts: Patient on Blood cellular tissue based products placed in the center Thinner since last visit: Eliquis Pain Present Now: No Electronic Signature(s) Signed: 03/03/2018 4:44:15 PM By: Roger Shelter Entered By: Roger Shelter on 03/03/2018 15:17:29 Oman, Charlyne Quale ([id number]) -------------------------------------------------------------------------------- Encounter Discharge Information Details Patient Name: [name] Date of Service: 03/03/2018 3:15 PM Medical Record Number: [id number] Patient Account Number: [id number] Date of Birth/Sex: Feb 25, 1923 (84 y.o. F) Treating RN: Roger Shelter Primary Care Nicey Krah: Fulton Reek Other Clinician: Referring Sinan Tuch: Fulton Reek Treating Owen Pagnotta/Extender: Melburn Hake, HOYT Weeks in Treatment: 4 Encounter Discharge Information Items Discharge Condition:  Stable Ambulatory Status: Walker Discharge Destination: Home Transportation: Private Auto Accompanied By: daughter Schedule Follow-up Appointment: Yes Clinical Summary of Care: Electronic Signature(s) Signed: 03/03/2018 4:44:15 PM By: Roger Shelter Entered By: Roger Shelter on 03/03/2018 16:22:14 Crossno, Ainslie Johnette Abraham ([id number]) -------------------------------------------------------------------------------- Lower Extremity Assessment Details Patient Name: [name] Date of Service: 03/03/2018 3:15 PM Medical Record Number: [id number] Patient Account Number: [id number] Date of Birth/Sex: June 24, 1923 (84 y.o. F) Treating RN: Roger Shelter Primary Care Latonya Nelon: Fulton Reek Other Clinician: Referring Phoenix Dresser: Fulton Reek Treating Silas Sedam/Extender: Melburn Hake, HOYT Weeks in Treatment: 4 Edema Assessment Assessed: [Left: No] [Right: No] [Left: Edema] [Right: :] Calf Left: Right: Point of Measurement: 30 cm From Medial Instep 31 cm cm Ankle Left: Right: Point of Measurement: 10 cm From Medial Instep 24.3 cm cm Vascular Assessment Claudication: Claudication Assessment [Left:None] Pulses: Dorsalis Pedis Palpable: [Left:Yes] Posterior Tibial Extremity colors, hair growth, and conditions: Extremity Color: [Left:Hyperpigmented] Hair Growth on Extremity: [Left:No] Temperature of Extremity: [Left:Warm] Capillary Refill: [Left:< 3 seconds] Toe Nail Assessment Left: Right: Thick: Yes Discolored: Yes Deformed: No Improper Length and Hygiene: No Electronic Signature(s) Signed: 03/03/2018 4:44:15 PM By: Roger Shelter Entered By: Roger Shelter on 03/03/2018 15:32:13 Shampine, Jlynn EMarland Kitchen ([id number]) -------------------------------------------------------------------------------- Multi Wound Chart Details Patient Name: [name] Date of Service: 03/03/2018 3:15 PM Medical Record Number: [id number] Patient Account Number: [id number] Date of Birth/Sex: 06-02-1923  (84 y.o. F) Treating RN: Montey Hora Primary Care Tauna Macfarlane: Fulton Reek Other Clinician: Referring Maelin Kurkowski: Fulton Reek Treating Lerline Valdivia/Extender: Melburn Hake, HOYT Weeks in Treatment: 4 Vital Signs Height(in): 66 Pulse(bpm): 66 Weight(lbs): 134 Blood Pressure(mmHg): 148/62 Body Mass Index(BMI): 22 Temperature(F): 97.9 Respiratory Rate 18 (breaths/min): Photos: [2:No Photos] [3:No Photos] [N/A:N/A] Wound Location: [2:Left Forearm] [3:Left, Posterior Lower Leg] [N/A:N/A] Wounding Event: [2:Trauma] [3:Gradually Appeared] [N/A:N/A] Primary Etiology: [2:Trauma, Other] [3:Venous Leg Ulcer] [N/A:N/A] Date Acquired: [2:02/01/2018] [3:12/05/2017] [N/A:N/A] Weeks of Treatment: [2:4] [3:4] [N/A:N/A] Wound Status: [2:Open] [3:Open] [N/A:N/A] Measurements L x W x D [2:1.5x1.2x0.1] [3:1.5x0.8x0.1] [N/A:N/A] (cm) Area (cm) : [2:1.414] [  3:0.942] [N/A:N/A] Volume (cm) : [2:0.141] [3:0.094] [N/A:N/A] % Reduction in Area: [2:80.20%] [3:76.00%] [N/A:N/A] % Reduction in Volume: [2:80.30%] [3:88.00%] [N/A:N/A] Classification: [2:Partial Thickness] [3:Full Thickness Without Exposed Support Structures] [N/A:N/A] Periwound Skin Texture: [2:No Abnormalities Noted] [3:No Abnormalities Noted] [N/A:N/A] Periwound Skin Moisture: [2:No Abnormalities Noted] [3:No Abnormalities Noted] [N/A:N/A] Periwound Skin Color: [2:No Abnormalities Noted No] [3:No Abnormalities Noted No] [N/A:N/A N/A] Treatment Notes Electronic Signature(s) Signed: 03/03/2018 4:59:39 PM By: Montey Hora Entered By: Montey Hora on 03/03/2018 16:00:42 Depner, Charlyne Quale ([id number]) -------------------------------------------------------------------------------- [facility] Details Patient Name: [name] Date of Service: 03/03/2018 3:15 PM Medical Record Number: [id number] Patient Account Number: [id number] Date of Birth/Sex: 21-Mar-1923 (84 y.o. F) Treating RN: Montey Hora Primary Care Jacari Iannello:  Fulton Reek Other Clinician: Referring Gursimran Litaker: Fulton Reek Treating Osei Anger/Extender: Melburn Hake, HOYT Weeks in Treatment: 4 Active Inactive ` Abuse / Safety / Falls / Self Care Management Nursing Diagnoses: History of Falls Goals: Patient will remain injury free related to falls Date Initiated: 02/03/2018 Target Resolution Date: 04/14/2018 Goal Status: Active Interventions: Assess fall risk on admission and as needed Notes: ` Orientation to the Wound Care Program Nursing Diagnoses: Knowledge deficit related to the wound healing center program Goals: Patient/caregiver will verbalize understanding of the [place] Date Initiated: 02/03/2018 Target Resolution Date: 04/15/2018 Goal Status: Active Interventions: Provide education on orientation to the wound center Notes: ` Wound/Skin Impairment Nursing Diagnoses: Impaired tissue integrity Goals: Ulcer/skin breakdown will heal within 14 weeks Date Initiated: 02/03/2018 Target Resolution Date: 04/15/2018 Goal Status: Active Interventions: OZETTA, FLATLEY ([id number]) Assess patient/caregiver ability to obtain necessary supplies Assess patient/caregiver ability to perform ulcer/skin care regimen upon admission and as needed Assess ulceration(s) every visit Notes: Electronic Signature(s) Signed: 03/03/2018 4:59:39 PM By: Montey Hora Entered By: Montey Hora on 03/03/2018 16:00:36 Lewis, Charlyne Quale ([id number]) -------------------------------------------------------------------------------- Pain Assessment Details Patient Name: [name] Date of Service: 03/03/2018 3:15 PM Medical Record Number: [id number] Patient Account Number: [id number] Date of Birth/Sex: [birth year] (84 y.o. F) Treating RN: Roger Shelter Primary Care Kady Toothaker: Fulton Reek Other Clinician: Referring Veleka Djordjevic: Fulton Reek Treating Estiben Mizuno/Extender: Melburn Hake, HOYT Weeks in Treatment: 4 Active Problems Location  of Pain Severity and Description of Pain Patient Has Paino No Site Locations Pain Management and Medication Current Pain Management: Electronic Signature(s) Signed: 03/03/2018 4:44:15 PM By: Roger Shelter Entered By: Roger Shelter on 03/03/2018 15:18:21 Mankey, Charlyne Quale ([id number]) -------------------------------------------------------------------------------- Patient/Caregiver Education Details Patient Name: [name] Date of Service: 03/03/2018 3:15 PM Medical Record Number: [id number] Patient Account Number: [id number] Date of Birth/Gender: Sep 16, 1922 (84 y.o. F) Treating RN: Roger Shelter Primary Care Physician: Fulton Reek Other Clinician: Referring Physician: Fulton Reek Treating Physician/Extender: Sharalyn Ink in Treatment: 4 Education Assessment Education Provided To: Patient Education Topics Provided Wound Debridement: Handouts: Wound Debridement Methods: Explain/Verbal Responses: State content correctly Wound/Skin Impairment: Handouts: Caring for Your Ulcer Methods: Explain/Verbal Responses: State content correctly Electronic Signature(s) Signed: 03/03/2018 4:44:15 PM By: Roger Shelter Entered By: Roger Shelter on 03/03/2018 16:22:30 Varkey, Delcia EMarland Kitchen ([id number]) -------------------------------------------------------------------------------- Wound Assessment Details Patient Name: [name] Date of Service: 03/03/2018 3:15 PM Medical Record Number: [id number] Patient Account Number: [id number] Date of Birth/Sex: Dec 03, 1922 (84 y.o. F) Treating RN: Roger Shelter Primary Care Eann Cleland: Fulton Reek Other Clinician: Referring Breda Bond: Fulton Reek Treating Malissa Slay/Extender: Melburn Hake, HOYT Weeks in Treatment: 4 Wound Status Wound Number: 2 Primary Etiology: Trauma, Other Wound Location: Left Forearm Wound Status: Open Wounding Event: Trauma Date Acquired: 02/01/2018 Weeks Of Treatment: 4 Clustered  Wound:  No Photos Photo Uploaded By: Roger Shelter on 03/03/2018 16:55:44 Wound Measurements Length: (cm) 1.5 Width: (cm) 1.2 Depth: (cm) 0.1 Area: (cm) 1.414 Volume: (cm) 0.141 % Reduction in Area: 80.2% % Reduction in Volume: 80.3% Wound Description Classification: Partial Thickness Periwound Skin Texture Texture Color No Abnormalities Noted: No No Abnormalities Noted: No Moisture No Abnormalities Noted: No Treatment Notes Wound #2 (Left Forearm) 1. Cleansed with: Clean wound with Normal Saline 2. Anesthetic Topical Lidocaine 4% cream to wound bed prior to debridement 4. Dressing Applied: GIONNI, FREESE ([id number]) Xeroform Notes conform , coban Electronic Signature(s) Signed: 03/03/2018 4:44:15 PM By: Roger Shelter Entered By: Roger Shelter on 03/03/2018 15:30:18 Bozza, Celsey EMarland Kitchen ([id number]) -------------------------------------------------------------------------------- Wound Assessment Details Patient Name: [name] Date of Service: 03/03/2018 3:15 PM Medical Record Number: [id number] Patient Account Number: [id number] Date of Birth/Sex: 08/29/1923 (84 y.o. F) Treating RN: Roger Shelter Primary Care Garcia Dalzell: Fulton Reek Other Clinician: Referring Shakeeta Godette: Fulton Reek Treating Tristram Milian/Extender: Melburn Hake, HOYT Weeks in Treatment: 4 Wound Status Wound Number: 3 Primary Etiology: Venous Leg Ulcer Wound Location: Left, Posterior Lower Leg Wound Status: Open Wounding Event: Gradually Appeared Date Acquired: 12/05/2017 Weeks Of Treatment: 4 Clustered Wound: No Photos Photo Uploaded By: Roger Shelter on 03/03/2018 16:55:45 Wound Measurements Length: (cm) 1.5 Width: (cm) 0.8 Depth: (cm) 0.1 Area: (cm) 0.942 Volume: (cm) 0.094 % Reduction in Area: 76% % Reduction in Volume: 88% Wound Description Full Thickness Without Exposed Support Classification: Structures Periwound Skin Texture Texture Color No Abnormalities Noted:  No No Abnormalities Noted: No Moisture No Abnormalities Noted: No Treatment Notes Wound #3 (Left, Posterior Lower Leg) 1. Cleansed with: Clean wound with Normal Saline 2. Anesthetic Topical Lidocaine 4% cream to wound bed prior to debridement Vandalen, Catricia E. ([id number]) 4. Dressing Applied: Prisma Ag 5. Secondary Dressing Applied ABD Pad 7. Secured with 3 Layer Compression System - Left Lower Extremity Electronic Signature(s) Signed: 03/03/2018 4:44:15 PM By: Roger Shelter Entered By: Roger Shelter on 03/03/2018 15:30:18 Ellender, Charlyne Quale ([id number]) -------------------------------------------------------------------------------- Vitals Details Patient Name: [name] Date of Service: 03/03/2018 3:15 PM Medical Record Number: [id number] Patient Account Number: [id number] Date of Birth/Sex: 1923-02-17 (84 y.o. F) Treating RN: Roger Shelter Primary Care Caster Fayette: Fulton Reek Other Clinician: Referring Cristianna Cyr: Fulton Reek Treating Ryann Pauli/Extender: Melburn Hake, HOYT Weeks in Treatment: 4 Vital Signs Time Taken: 15:18 Temperature (F): 97.9 Height (in): 66 Pulse (bpm): 66 Weight (lbs): 134 Respiratory Rate (breaths/min): 18 Body Mass Index (BMI): 21.6 Blood Pressure (mmHg): 148/62 Reference Range: 80 - 120 mg / dl Electronic Signature(s) Signed: 03/03/2018 4:44:15 PM By: Roger Shelter Entered By: Roger Shelter on 03/03/2018 15:19:07

## 2018-03-07 ENCOUNTER — Ambulatory Visit: Payer: Medicare Other | Admitting: Physician Assistant

## 2018-03-07 NOTE — Progress Notes (Signed)
Ann, Meyers ([id number]) Visit Report for 03/03/2018 Chief Complaint Document Details Patient Name: [name], [name] Date of Service: 03/03/2018 3:15 PM Medical Record Number: [id number] Patient Account Number: [id number] Date of Birth/Sex: [birth year] (84 y.o. F) Treating RN: Montey Hora Primary Care Provider: Fulton Reek Other Clinician: Referring Provider: Fulton Reek Treating Provider/Extender: Melburn Hake, Mishayla Sliwinski Weeks in Treatment: 4 Information Obtained from: Patient Chief Complaint Left forearm wound and left lower extremity ulcer Electronic Signature(s) Signed: 03/04/2018 12:34:34 PM By: Worthy Keeler PA-C Entered By: Worthy Keeler on 03/03/2018 15:50:29 Monterosso, Charlyne Quale ([id number]) -------------------------------------------------------------------------------- HPI Details Patient Name: [name] Date of Service: 03/03/2018 3:15 PM Medical Record Number: [id number] Patient Account Number: [id number] Date of Birth/Sex: Jan 09, 1923 (84 y.o. F) Treating RN: Montey Hora Primary Care Provider: Fulton Reek Other Clinician: Referring Provider: Fulton Reek Treating Provider/Extender: Melburn Hake, Kaileen Bronkema Weeks in Treatment: 4 History of Present Illness HPI Description: 02/03/18 on evaluation today patient presents for initial evaluation and our clinic is referral from [facility] ER. She sustained a skin tear on Wednesday of this week, two days ago to the left forearm. Subsequently she is on Plavix due to a history of TIAs. Subsequently she does have a fairly significant bruising to the left forearm. This occurred when she was walking in her garden and her caretaker who was with her noted that she began to fall and attempted to grab her in order to prevent the fall subsequently causing a skin tear on the left forearm. With that being said this is causing some discomfort to the patient she tells me. In regard to left lateral cath ulcer this is  something that arose gradually 6 to 8 weeks ago. She has seen dermatology they recommended Vaseline. Subsequently she saw her cardiologist and they recommended triple antibiotic ointment. Unfortunately neither has really improved her overall wound status at this point. She has been tolerating the Steri-Strips and dressing changes to left forearm and again no dressing has really been applied to the left lateral lower extremity. She does have bilateral lower extremity edema. 02/10/18-She is here in follow-up evaluation for a left posterior calf ulcer and left forearm skin tear. The remaining Steri-Strips were removed from the left forearm, there is residual hematoma that was incised and drained. She tolerated debridement to the posterior calf. We will increase compression and she will follow-up next week 02/17/18 on evaluation today patient appears to be doing rather well in regard to her left upper extremity ulcer. The skin tear is actually healing quite nicely in the majority possibly even 90% of the skin reattached on inspection today. This is obviously excellent news. With that being said her left lateral lower Trinity also is continuing to do well the Iodoflex does seem to be beneficial. She's been tolerating this without any significant discomfort at this point she tells me. 02/24/18 on evaluation today patient appears to be doing very well in regard to her left forearm ulcer as well as her left lateral lower extremity ulcer. She has been tolerating the dressing changes without complication including the wrap. At this point I think that she's showing signs of excellent healing and again there's no evidence of infection. 03/03/18 on evaluation today patient appears to show signs of excellent improvement at this point which is great news. This she has been tolerating the dressing changes well complication her left forearm is almost completely healed and her left lower extremity also shown signs of  excellent healing and improvement. Overall I'm  very happy with how things are going at this point. Patient likewise is also extremely pleased and is having much less discomfort than she has in the past. Electronic Signature(s) Signed: 03/04/2018 12:34:34 PM By: Worthy Keeler PA-C Entered By: Worthy Keeler on 03/04/2018 12:28:45 TAKIAH, MAIDEN ([id number]) -------------------------------------------------------------------------------- Physical Exam Details Patient Name: [name] Date of Service: 03/03/2018 3:15 PM Medical Record Number: [id number] Patient Account Number: [id number] Date of Birth/Sex: 08/13/1923 (84 y.o. F) Treating RN: Montey Hora Primary Care Provider: Fulton Reek Other Clinician: Referring Provider: Fulton Reek Treating Provider/Extender: Melburn Hake, Jonique Kulig Weeks in Treatment: 4 Constitutional Well-nourished and well-hydrated in no acute distress. Respiratory normal breathing without difficulty. clear to auscultation bilaterally. Cardiovascular regular rate and rhythm with normal S1, S2. Psychiatric this patient is able to make decisions and demonstrates good insight into disease process. Alert and Oriented x 3. pleasant and cooperative. Notes Patient's wound that not require sharp debridement today and she seems to be doing excellent at this point. I'm gonna suggest that we actually continue with the Current wound care measures for the next week since everything custom to be progressing so well. That is in regard to left upper extremity. Regards the left lower extremity we may make some changes. Electronic Signature(s) Signed: 03/04/2018 12:34:34 PM By: Worthy Keeler PA-C Entered By: Worthy Keeler on 03/04/2018 12:29:37 TRENITA, HULME ([id number]) -------------------------------------------------------------------------------- Physician Orders Details Patient Name: [name] Date of Service: 03/03/2018 3:15 PM Medical Record Number:  [id number] Patient Account Number: [id number] Date of Birth/Sex: 05/06/1923 (84 y.o. F) Treating RN: Montey Hora Primary Care Provider: Fulton Reek Other Clinician: Referring Provider: Fulton Reek Treating Provider/Extender: Melburn Hake, Leticia Mcdiarmid Weeks in Treatment: 4 Verbal / Phone Orders: No Diagnosis Coding ICD-10 Coding Code Description I87.312 Chronic venous hypertension (idiopathic) with ulcer of left lower extremity L97.222 Non-pressure chronic ulcer of left calf with fat layer exposed S51.802S Unspecified open wound of left forearm, sequela I25.10 Atherosclerotic heart disease of native coronary artery without angina pectoris G45.9 Transient cerebral ischemic attack, unspecified Z79.01 Long term (current) use of anticoagulants J44.9 Chronic obstructive pulmonary disease, unspecified Z95.0 Presence of cardiac pacemaker Wound Cleansing Wound #2 Left Forearm o Clean wound with Normal Saline. Wound #3 Left,Posterior Lower Leg o Cleanse wound with mild soap and water Primary Wound Dressing Wound #2 Left Forearm o Xeroform Wound #3 Left,Posterior Lower Leg o Silver Collagen Secondary Dressing Wound #2 Left Forearm o ABD and Kerlix/Conform - wrap lightly with conforming rolled gauze and secure with netting Wound #3 Left,Posterior Lower Leg o ABD pad Dressing Change Frequency Wound #2 Left Forearm o Change Dressing Monday, Wednesday, Friday - HHRN to visit patient Monday, Wednesday and Friday week of 03/06/18 Wound #3 Left,Posterior Lower Leg o Change Dressing Monday, Wednesday, Friday - HHRN to visit patient Monday, Wednesday and Friday week of 03/06/18 EMUNAH, TEXIDOR ([id number]) Follow-up Appointments Wound #2 Left Forearm o Return Appointment in: - Monday 03/13/18 Wound #3 Left,Posterior Lower Leg o Return Appointment in: - Monday 03/13/18 Edema Control Wound #3 Left,Posterior Lower Leg o 3 Layer Compression System - Left Lower Extremity Home  Health Wound #2 Left Forearm o [place] Nurse may visit PRN to address patientos wound care needs. o FACE TO FACE ENCOUNTER: MEDICARE and MEDICAID PATIENTS: I certify that this patient is under my care and that I had a face-to-face encounter that meets the physician face-to-face encounter requirements with this patient on this date. The encounter with  the patient was in whole or in part for the following MEDICAL CONDITION: (primary reason for [place]) MEDICAL NECESSITY: I certify, that based on my findings, NURSING services are a medically necessary home health service. HOME BOUND STATUS: I certify that my clinical findings support that this patient is homebound (i.e., Due to illness or injury, pt requires aid of supportive devices such as crutches, cane, wheelchairs, walkers, the use of special transportation or the assistance of another person to leave their place of residence. There is a normal inability to leave the home and doing so requires considerable and taxing effort. Other absences are for medical reasons / religious services and are infrequent or of short duration when for other reasons). o If current dressing causes regression in wound condition, may D/C ordered dressing product/s and apply Normal Saline Moist Dressing daily until next [place] / Other MD appointment. [place] of regression in wound condition at [phone]. o Please direct any NON-WOUND related issues/requests for orders to patient's Primary Care Physician Wound #3 Tina Nurse may visit PRN to address patientos wound care needs. o FACE TO FACE ENCOUNTER: MEDICARE and MEDICAID PATIENTS: I certify that this patient is under my care and that I had a face-to-face encounter that meets the physician face-to-face encounter requirements with this patient on this date. The  encounter with the patient was in whole or in part for the following MEDICAL CONDITION: (primary reason for [place]) MEDICAL NECESSITY: I certify, that based on my findings, NURSING services are a medically necessary home health service. HOME BOUND STATUS: I certify that my clinical findings support that this patient is homebound (i.e., Due to illness or injury, pt requires aid of supportive devices such as crutches, cane, wheelchairs, walkers, the use of special transportation or the assistance of another person to leave their place of residence. There is a normal inability to leave the home and doing so requires considerable and taxing effort. Other absences are for medical reasons / religious services and are infrequent or of short duration when for other reasons). o If current dressing causes regression in wound condition, may D/C ordered dressing product/s and apply Normal Saline Moist Dressing daily until next [place] / Other MD appointment. [place] of regression in wound condition at [phone]. o Please direct any NON-WOUND related issues/requests for orders to patient's Primary Care Physician Electronic Signature(s) Signed: 03/03/2018 4:59:39 PM By: Montey Hora Signed: 03/04/2018 12:34:34 PM By: Worthy Keeler PA-C Entered By: Montey Hora on 03/03/2018 16:09:00 Degan, Charlyne Quale ([id number]) -------------------------------------------------------------------------------- Problem List Details Patient Name: [name] Date of Service: 03/03/2018 3:15 PM Medical Record Number: [id number] Patient Account Number: [id number] Date of Birth/Sex: [birth year] (84 y.o. F) Treating RN: Montey Hora Primary Care Provider: Fulton Reek Other Clinician: Referring Provider: Fulton Reek Treating Provider/Extender: Melburn Hake, Aqeel Norgaard Weeks in Treatment: 4 Active Problems ICD-10 Evaluated Encounter Code Description Active Date Today  Diagnosis I87.312 Chronic venous hypertension (idiopathic) with ulcer of left 02/03/2018 No Yes lower extremity L97.222 Non-pressure chronic ulcer of left calf with fat layer exposed 02/03/2018 No Yes S51.802S Unspecified open wound of left forearm, sequela 02/03/2018 No Yes I25.10 Atherosclerotic heart disease of native coronary artery 02/03/2018 No Yes without angina pectoris G45.9 Transient cerebral ischemic attack, unspecified 02/03/2018 No Yes Z79.01 Long term (current) use of anticoagulants 02/03/2018 No Yes J44.9 Chronic obstructive pulmonary disease, unspecified 02/03/2018 No Yes Z95.0 Presence of  cardiac pacemaker 02/03/2018 No Yes Inactive Problems Resolved Problems Electronic Signature(s) Signed: 03/04/2018 12:34:34 PM By: Mena Goes, Arelia E. ([id number]) Entered By: Worthy Keeler on 03/03/2018 15:50:19 Cravey, Charlyne Quale ([id number]) -------------------------------------------------------------------------------- Progress Note Details Patient Name: [name] Date of Service: 03/03/2018 3:15 PM Medical Record Number: [id number] Patient Account Number: [id number] Date of Birth/Sex: 10/08/1922 (84 y.o. F) Treating RN: Montey Hora Primary Care Provider: Fulton Reek Other Clinician: Referring Provider: Fulton Reek Treating Provider/Extender: Melburn Hake, Glynis Hunsucker Weeks in Treatment: 4 Subjective Chief Complaint Information obtained from Patient Left forearm wound and left lower extremity ulcer History of Present Illness (HPI) 02/03/18 on evaluation today patient presents for initial evaluation and our clinic is referral [facility]. She sustained a skin tear on Wednesday of this week, two days ago to the left forearm. Subsequently she is on Plavix due to a history of TIAs. Subsequently she does have a fairly significant bruising to the left forearm. This occurred when she was walking in her garden and her caretaker who was with her  noted that she began to fall and attempted to grab her in order to prevent the fall subsequently causing a skin tear on the left forearm. With that being said this is causing some discomfort to the patient she tells me. In regard to left lateral cath ulcer this is something that arose gradually 6 to 8 weeks ago. She has seen dermatology they recommended Vaseline. Subsequently she saw her cardiologist and they recommended triple antibiotic ointment. Unfortunately neither has really improved her overall wound status at this point. She has been tolerating the Steri-Strips and dressing changes to left forearm and again no dressing has really been applied to the left lateral lower extremity. She does have bilateral lower extremity edema. 02/10/18-She is here in follow-up evaluation for a left posterior calf ulcer and left forearm skin tear. The remaining Steri-Strips were removed from the left forearm, there is residual hematoma that was incised and drained. She tolerated debridement to the posterior calf. We will increase compression and she will follow-up next week 02/17/18 on evaluation today patient appears to be doing rather well in regard to her left upper extremity ulcer. The skin tear is actually healing quite nicely in the majority possibly even 90% of the skin reattached on inspection today. This is obviously excellent news. With that being said her left lateral lower Trinity also is continuing to do well the Iodoflex does seem to be beneficial. She's been tolerating this without any significant discomfort at this point she tells me. 02/24/18 on evaluation today patient appears to be doing very well in regard to her left forearm ulcer as well as her left lateral lower extremity ulcer. She has been tolerating the dressing changes without complication including the wrap. At this point I think that she's showing signs of excellent healing and again there's no evidence of infection. 03/03/18 on  evaluation today patient appears to show signs of excellent improvement at this point which is great news. This she has been tolerating the dressing changes well complication her left forearm is almost completely healed and her left lower extremity also shown signs of excellent healing and improvement. Overall I'm very happy with how things are going at this point. Patient likewise is also extremely pleased and is having much less discomfort than she has in the past. Patient History Information obtained from Patient. Family History Cancer - Child, Hypertension - Child, Thyroid Problems - Child, No family  history of Diabetes, Heart Disease, Kidney Disease, Lung Disease, Seizures, Stroke, Tuberculosis. Social History Never smoker, Marital Status - Widowed, Alcohol Use - Never, Drug Use - No History, Caffeine Use - Daily. Medical History SHAWNTRICE, SALLE ([id number]) Hospitalization/Surgery History - 12/19/2017, [facility], TIA. 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 breast biopsy (most recent 2004)- no CA Review of Systems (ROS) Constitutional Symptoms (General Health) Denies complaints or symptoms of Fever, Chills. Respiratory The patient has no complaints or symptoms. Cardiovascular Complains or has symptoms of LE edema. Psychiatric The patient has no complaints or symptoms. Objective Constitutional Well-nourished and well-hydrated in no acute distress. Vitals Time Taken: 3:18 PM, Height: 66 in, Weight: 134 lbs, BMI: 21.6, Temperature: 97.9 F, Pulse: 66 bpm, Respiratory Rate: 18 breaths/min, Blood Pressure: 148/62 mmHg. Respiratory normal breathing without difficulty. clear to auscultation bilaterally. Cardiovascular regular  rate and rhythm with normal S1, S2. Psychiatric this patient is able to make decisions and demonstrates good insight into disease process. Alert and Oriented x 3. pleasant and cooperative. General Notes: Patient's wound that not require sharp debridement today and she seems to be doing excellent at this point. I'm gonna suggest that we actually continue with the Current wound care measures for the next week since everything custom to be progressing so well. That is in regard to left upper extremity. Regards the left lower extremity we may make some changes. Integumentary (Hair, Skin) Law[place], Jacci E. ([id number]) Wound #2 status is Open. Original cause of wound was Trauma. The wound is located on the Left Forearm. The wound measures 1.5cm length x 1.2cm width x 0.1cm depth; 1.414cm^2 area and 0.141cm^3 volume. Wound #3 status is Open. Original cause of wound was Gradually Appeared. The wound is located on the Left,Posterior Lower Leg. The wound measures 1.5cm length x 0.8cm width x 0.1cm depth; 0.942cm^2 area and 0.094cm^3 volume. Assessment Active Problems ICD-10 Chronic venous hypertension (idiopathic) with ulcer of left lower extremity Non-pressure chronic ulcer of left calf with fat layer exposed Unspecified open wound of left forearm, sequela Atherosclerotic heart disease of native coronary artery without angina pectoris Transient cerebral ischemic attack, unspecified Long term (current) use of anticoagulants Chronic obstructive pulmonary disease, unspecified Presence of cardiac pacemaker Plan Wound Cleansing: Wound #2 Left Forearm: Clean wound with Normal Saline. Wound #3 Left,Posterior Lower Leg: Cleanse wound with mild soap and water Primary Wound Dressing: Wound #2 Left Forearm: Xeroform Wound #3 Left,Posterior Lower Leg: Silver Collagen Secondary Dressing: Wound #2 Left Forearm: ABD and Kerlix/Conform - wrap lightly with conforming rolled gauze and secure with  netting Wound #3 Left,Posterior Lower Leg: ABD pad Dressing Change Frequency: Wound #2 Left Forearm: Change Dressing Monday, Wednesday, Friday - HHRN to visit patient Monday, Wednesday and Friday week of 03/06/18 Wound #3 Left,Posterior Lower Leg: Change Dressing Monday, Wednesday, Friday - HHRN to visit patient Monday, Wednesday and Friday week of 03/06/18 Follow-up Appointments: Wound #2 Left Forearm: Return Appointment in: - Monday 03/13/18 Wound #3 Left,Posterior Lower Leg: Return Appointment in: - Monday 03/13/18 Edema Control: Wound #3 Left,Posterior Lower Leg: 3 Layer Compression System - Left Lower Extremity KIAH, VANALSTINE ([id number]) Home Health: Wound #2 Left Forearm: [place] Nurse may visit PRN to address patient s wound care needs. FACE TO FACE ENCOUNTER: MEDICARE and MEDICAID PATIENTS: I certify that this patient is  under my care and that I had a face-to-face encounter that meets the physician face-to-face encounter requirements with this patient on this date. The encounter with the patient was in whole or in part for the following MEDICAL CONDITION: (primary reason for [place]) MEDICAL NECESSITY: I certify, that based on my findings, NURSING services are a medically necessary home health service. HOME BOUND STATUS: I certify that my clinical findings support that this patient is homebound (i.e., Due to illness or injury, pt requires aid of supportive devices such as crutches, cane, wheelchairs, walkers, the use of special transportation or the assistance of another person to leave their place of residence. There is a normal inability to leave the home and doing so requires considerable and taxing effort. Other absences are for medical reasons / religious services and are infrequent or of short duration when for other reasons). If current dressing causes regression in wound condition, may D/C ordered dressing product/s and apply Normal  Saline Moist Dressing daily until next [place] / Other MD appointment. [place] of regression in wound condition at [phone]. Please direct any NON-WOUND related issues/requests for orders to patient's Primary Care Physician Wound #3 Left,Posterior Lower Leg: [place] Nurse may visit PRN to address patient s wound care needs. FACE TO FACE ENCOUNTER: MEDICARE and MEDICAID PATIENTS: I certify that this patient is under my care and that I had a face-to-face encounter that meets the physician face-to-face encounter requirements with this patient on this date. The encounter with the patient was in whole or in part for the following MEDICAL CONDITION: (primary reason for [place]) MEDICAL NECESSITY: I certify, that based on my findings, NURSING services are a medically necessary home health service. HOME BOUND STATUS: I certify that my clinical findings support that this patient is homebound (i.e., Due to illness or injury, pt requires aid of supportive devices such as crutches, cane, wheelchairs, walkers, the use of special transportation or the assistance of another person to leave their place of residence. There is a normal inability to leave the home and doing so requires considerable and taxing effort. Other absences are for medical reasons / religious services and are infrequent or of short duration when for other reasons). If current dressing causes regression in wound condition, may D/C ordered dressing product/s and apply Normal Saline Moist Dressing daily until next [place] / Other MD appointment. [place] of regression in wound condition at [phone]. Please direct any NON-WOUND related issues/requests for orders to patient's Primary Care Physician I am going to suggest the above wound care measures for the next week. Patient is in agreement with plan. Hopefully the collagen on the  left lower extremity at this time is going to make a big difference as far as getting this to go ahead and close on up. We will otherwise see were things stand at follow-up. Please see above for specific wound care orders. We will see patient for re-evaluation in 1 week(s) here in the clinic. If anything worsens or changes patient will contact our office for additional recommendations. Electronic Signature(s) Signed: 03/04/2018 12:34:34 PM By: Worthy Keeler PA-C Entered By: Worthy Keeler on 03/04/2018 12:30:29 MAKENDRA, VIGEANT ([id number]) -------------------------------------------------------------------------------- ROS/PFSH Details Patient Name: [name] Date of Service: 03/03/2018 3:15 PM Medical Record Number: [id number] Patient Account Number: [id number] Date of Birth/Sex: [birth year] (84 y.o. F) Treating RN: Montey Hora Primary Care Provider: Fulton Reek Other Clinician: Referring Provider:  Fulton Reek Treating Provider/Extender: Melburn Hake, Catalino Plascencia Weeks in Treatment: 4 Information Obtained From Patient Wound History Do you currently have one or more open woundso Yes How many open wounds do you currently haveo 2 Approximately how long have you had your woundso 1 month How have you been treating your wound(s) until nowo neosporin Has your wound(s) ever healed and then re-openedo No Have you had any lab work done in the past montho No Have you tested positive for an antibiotic resistant organism (MRSA, VRE)o No Have you tested positive for osteomyelitis (bone infection)o No Have you had any tests for circulation on your legso No Constitutional Symptoms (General Health) Complaints and Symptoms: Negative for: Fever; Chills Cardiovascular Complaints and Symptoms: Positive for: LE edema Medical History: Positive for: Deep Vein Thrombosis - 03/2006 R leg; Hypertension; Myocardial Infarction - 09/2005 Negative for: Angina; Arrhythmia; Congestive Heart Failure;  Coronary Artery Disease; Hypotension; Peripheral Arterial Disease; Peripheral Venous Disease; Phlebitis; Vasculitis Past Medical History Notes: pacemaker placed 01/2011 Eyes Medical History: Positive for: Cataracts - removed 2004 from R eye and L eye 2009 and 2014 Negative for: Glaucoma; Optic Neuritis Respiratory Complaints and Symptoms: No Complaints or Symptoms Medical History: Positive for: Chronic Obstructive Pulmonary Disease (COPD) Negative for: Aspiration; Asthma; Pneumothorax; Sleep Apnea; Tuberculosis Past Medical History Notes: PE 2007 Gastrointestinal AIYA, KEACH. ([id number]) Medical History: Negative for: Cirrhosis ; Colitis; Crohnos; Hepatitis A; Hepatitis B; Hepatitis C Past Medical History Notes: GERD; diverticuitis; colon spams; appendectomy 1935 Endocrine Medical History: Negative for: Type I Diabetes; Type II Diabetes Genitourinary Medical History: Negative for: End Stage Renal Disease Immunological Medical History: Negative for: Lupus Erythematosus; Raynaudos; Scleroderma Integumentary (Skin) Medical History: Negative for: History of Burn; History of pressure wounds Musculoskeletal Medical History: Positive for: Osteoarthritis Negative for: Gout; Rheumatoid Arthritis; Osteomyelitis Past Medical History Notes: scoliosis; bulging discs Neurologic Medical History: Negative for: Dementia; Neuropathy; Quadriplegia; Seizure Disorder Past Medical History Notes: TIA's 2007,2013,2014 Oncologic Medical History: Negative for: Received Chemotherapy; Received Radiation Past Medical History Notes: Skin CA- squamous cell carcinoma 2014 R leg; mult. breast biopsy (most recent 2004)- no CA Psychiatric Complaints and Symptoms: No Complaints or Symptoms Medical History: Negative for: Anorexia/bulimia; Confinement Anxiety HBO Extended History Items Eyes: Cataracts Steveson, Kirby E. ([id number]) Immunizations Pneumococcal Vaccine: Received Pneumococcal  Vaccination: Yes Tetanus Vaccine: Last tetanus shot: 03/29/2012 Immunization Notes: Flu Shot 10/15; Pneumona 2013 Implantable Devices Hospitalization / Surgery History Name of Hospital Purpose of Hospitalization/Surgery Date [place] TIA 12/19/2017 Family and Social History Cancer: Yes - Child; Diabetes: No; Heart Disease: No; Hypertension: Yes - Child; Kidney Disease: No; Lung Disease: No; Seizures: No; Stroke: No; Thyroid Problems: Yes - Child; Tuberculosis: No; Never smoker; Marital Status - Widowed; Alcohol Use: Never; Drug Use: No History; Caffeine Use: Daily; Financial Concerns: No; Food, Clothing or Shelter Needs: No; Support System Lacking: No; Transportation Concerns: No; Advanced Directives: Yes (Not Provided); Patient does not want information on Advanced Directives; Do not resuscitate: Yes (Not Provided); Living Will: Yes (Not Provided); Medical Power of Attorney: Yes - Williams Che- daughter (Not Provided) Physician Affirmation I have reviewed and agree with the above information. Electronic Signature(s) Signed: 03/04/2018 12:34:34 PM By: Worthy Keeler PA-C Signed: 03/06/2018 5:27:38 PM By: Montey Hora Entered By: Worthy Keeler on 03/04/2018 12:29:10 Baccari, Korah EMarland Kitchen ([id number]) -------------------------------------------------------------------------------- SuperBill Details Patient Name: [name] Date of Service: 03/03/2018 Medical Record Number: [id number] Patient Account Number: [id number] Date of Birth/Sex: 10-Feb-1923 (84 y.o. F) Treating RN: Montey Hora Primary Care Provider: Fulton Reek  Other Clinician: Referring Provider: Fulton Reek Treating Provider/Extender: Melburn Hake, Hannelore Bova Weeks in Treatment: 4 Diagnosis Coding ICD-10 Codes Code Description I87.312 Chronic venous hypertension (idiopathic) with ulcer of left lower extremity L97.222 Non-pressure chronic ulcer of left calf with fat layer exposed S51.802S Unspecified open wound of left forearm,  sequela I25.10 Atherosclerotic heart disease of native coronary artery without angina pectoris G45.9 Transient cerebral ischemic attack, unspecified Z79.01 Long term (current) use of anticoagulants J44.9 Chronic obstructive pulmonary disease, unspecified Z95.0 Presence of cardiac pacemaker Facility Procedures CPT4 Code: 20802233 Description: (Facility Use Only) [phone] - APPLY MULTLAY COMPRS LWR LT LEG Modifier: Quantity: 1 Physician Procedures CPT4 Code Description: [id number] 10211 - WC PHYS LEVEL 3 - EST PT ICD-10 Diagnosis Description I87.312 Chronic venous hypertension (idiopathic) with ulcer of left lo L97.222 Non-pressure chronic ulcer of left calf with fat layer exposed S51.802S  Unspecified open wound of left forearm, sequela I25.10 Atherosclerotic heart disease of native coronary artery withou Modifier: wer extremity t angina pectori Quantity: 1 s Electronic Signature(s) Signed: 03/04/2018 12:34:34 PM By: Worthy Keeler PA-C Previous Signature: 03/03/2018 4:59:39 PM Version By: Montey Hora Entered By: Worthy Keeler on 03/04/2018 12:31:41

## 2018-03-13 ENCOUNTER — Encounter: Payer: Medicare Other | Attending: Physician Assistant | Admitting: Physician Assistant

## 2018-03-13 DIAGNOSIS — L97222 Non-pressure chronic ulcer of left calf with fat layer exposed: Secondary | ICD-10-CM | POA: Diagnosis present

## 2018-03-13 DIAGNOSIS — Z86718 Personal history of other venous thrombosis and embolism: Secondary | ICD-10-CM | POA: Insufficient documentation

## 2018-03-13 DIAGNOSIS — I509 Heart failure, unspecified: Secondary | ICD-10-CM | POA: Diagnosis not present

## 2018-03-13 DIAGNOSIS — I2510 Atherosclerotic heart disease of native coronary artery without angina pectoris: Secondary | ICD-10-CM | POA: Insufficient documentation

## 2018-03-13 DIAGNOSIS — Z7901 Long term (current) use of anticoagulants: Secondary | ICD-10-CM | POA: Insufficient documentation

## 2018-03-13 DIAGNOSIS — Z950 Presence of cardiac pacemaker: Secondary | ICD-10-CM | POA: Diagnosis not present

## 2018-03-13 DIAGNOSIS — J449 Chronic obstructive pulmonary disease, unspecified: Secondary | ICD-10-CM | POA: Diagnosis not present

## 2018-03-13 DIAGNOSIS — I110 Hypertensive heart disease with heart failure: Secondary | ICD-10-CM | POA: Insufficient documentation

## 2018-03-13 DIAGNOSIS — Z8673 Personal history of transient ischemic attack (TIA), and cerebral infarction without residual deficits: Secondary | ICD-10-CM | POA: Insufficient documentation

## 2018-03-13 DIAGNOSIS — K219 Gastro-esophageal reflux disease without esophagitis: Secondary | ICD-10-CM | POA: Insufficient documentation

## 2018-03-13 DIAGNOSIS — Z79899 Other long term (current) drug therapy: Secondary | ICD-10-CM | POA: Diagnosis not present

## 2018-03-13 DIAGNOSIS — I87312 Chronic venous hypertension (idiopathic) with ulcer of left lower extremity: Secondary | ICD-10-CM | POA: Diagnosis not present

## 2018-03-14 NOTE — Progress Notes (Addendum)
ZION, LINT ([id number]) Visit Report for 03/13/2018 Arrival Information Details Patient Name: [name], [name] Date of Service: 03/13/2018 1:00 PM Medical Record Number: [id number] Patient Account Number: [id number] Date of Birth/Sex: [birth year] (84 y.o. F) Treating RN: Ahmed Prima Primary Care Meredeth Furber: Fulton Reek Other Clinician: Referring Megin Consalvo: Fulton Reek Treating Riah Kehoe/Extender: Melburn Hake, HOYT Weeks in Treatment: 5 Visit Information History Since Last Visit All ordered tests and consults were completed: No Patient Arrived: Gilford Rile Added or deleted any medications: No Arrival Time: 13:08 Any new allergies or adverse reactions: No Accompanied By: daughter Had a fall or experienced change in No Transfer Assistance: EasyPivot Patient activities of daily living that may affect Lift risk of falls: Patient Identification Verified: Yes Signs or symptoms of abuse/neglect since last visito No Secondary Verification Process Yes Hospitalized since last visit: No Completed: Implantable device outside of the clinic excluding No Patient Requires Transmission-Based No cellular tissue based products placed in the center Precautions: since last visit: Patient Has Alerts: Yes Has Dressing in Place as Prescribed: Yes Patient Alerts: Patient on Blood Has Compression in Place as Prescribed: Yes Thinner Eliquis Pain Present Now: No Electronic Signature(s) Signed: 03/13/2018 5:33:19 PM By: Alric Quan Entered By: Alric Quan on 03/13/2018 13:08:58 Mawson, Anwyn Johnette Abraham ([id number]) -------------------------------------------------------------------------------- Clinic Level of Care Assessment Details Patient Name: [name] Date of Service: 03/13/2018 1:00 PM Medical Record Number: [id number] Patient Account Number: [id number] Date of Birth/Sex: [birth year] (84 y.o. F) Treating RN: Roger Shelter Primary Care Mehar Kirkwood: Fulton Reek Other Clinician: Referring  Jourdon Zimmerle: Fulton Reek Treating Media Pizzini/Extender: Melburn Hake, HOYT Weeks in Treatment: 5 Clinic Level of Care Assessment Items TOOL 4 Quantity Score X - Use when only an EandM is performed on FOLLOW-UP visit 1 0 ASSESSMENTS - Nursing Assessment / Reassessment X - Reassessment of Co-morbidities (includes updates in patient status) 1 10 X- 1 5 Reassessment of Adherence to Treatment Plan ASSESSMENTS - Wound and Skin Assessment / Reassessment X - Simple Wound Assessment / Reassessment - one wound 1 5 []  - 0 Complex Wound Assessment / Reassessment - multiple wounds []  - 0 Dermatologic / Skin Assessment (not related to wound area) ASSESSMENTS - Focused Assessment []  - Circumferential Edema Measurements - multi extremities 0 []  - 0 Nutritional Assessment / Counseling / Intervention []  - 0 Lower Extremity Assessment (monofilament, tuning fork, pulses) []  - 0 Peripheral Arterial Disease Assessment (using hand held doppler) ASSESSMENTS - Ostomy and/or Continence Assessment and Care []  - Incontinence Assessment and Management 0 []  - 0 Ostomy Care Assessment and Management (repouching, etc.) PROCESS - Coordination of Care X - Simple Patient / Family Education for ongoing care 1 15 []  - 0 Complex (extensive) Patient / Family Education for ongoing care []  - 0 Staff obtains Programmer, systems, Records, Test Results / Process Orders []  - 0 Staff telephones HHA, Nursing Homes / Clarify orders / etc []  - 0 Routine Transfer to another Facility (non-emergent condition) []  - 0 Routine Hospital Admission (non-emergent condition) []  - 0 New Admissions / Biomedical engineer / Ordering NPWT, Apligraf, etc. []  - 0 Emergency Hospital Admission (emergent condition) X- 1 10 Simple Discharge Coordination Amesquita, Ande E. ([id number]) []  - 0 Complex (extensive) Discharge Coordination PROCESS - Special Needs []  - Pediatric / Minor Patient Management 0 []  - 0 Isolation Patient Management []  -  0 Hearing / Language / Visual special needs []  - 0 Assessment of Community assistance (transportation, D/C planning, etc.) []  - 0 Additional assistance / Altered mentation []  - 0 Support Surface(s) Assessment (  bed, cushion, seat, etc.) INTERVENTIONS - Wound Cleansing / Measurement X - Simple Wound Cleansing - one wound 1 5 []  - 0 Complex Wound Cleansing - multiple wounds X- 1 5 Wound Imaging (photographs - any number of wounds) []  - 0 Wound Tracing (instead of photographs) X- 1 5 Simple Wound Measurement - one wound []  - 0 Complex Wound Measurement - multiple wounds INTERVENTIONS - Wound Dressings X - Small Wound Dressing one or multiple wounds 1 10 []  - 0 Medium Wound Dressing one or multiple wounds []  - 0 Large Wound Dressing one or multiple wounds []  - 0 Application of Medications - topical []  - 0 Application of Medications - injection INTERVENTIONS - Miscellaneous []  - External ear exam 0 []  - 0 Specimen Collection (cultures, biopsies, blood, body fluids, etc.) []  - 0 Specimen(s) / Culture(s) sent or taken to Lab for analysis []  - 0 Patient Transfer (multiple staff / Civil Service fast streamer / Similar devices) []  - 0 Simple Staple / Suture removal (25 or less) []  - 0 Complex Staple / Suture removal (26 or more) []  - 0 Hypo / Hyperglycemic Management (close monitor of Blood Glucose) []  - 0 Ankle / Brachial Index (ABI) - do not check if billed separately X- 1 5 Vital Signs Ozaki, Cyndie E. ([id number]) Has the patient been seen at the hospital within the last three years: Yes Total Score: 75 Level Of Care: New/Established - Level 2 Electronic Signature(s) Signed: 03/14/2018 11:46:23 AM By: Roger Shelter Entered By: Roger Shelter on 03/13/2018 13:40:50 Atiyeh, Charlyne Quale ([id number]) -------------------------------------------------------------------------------- Encounter Discharge Information Details Patient Name: [name] Date of Service: 03/13/2018 1:00  PM Medical Record Number: [id number] Patient Account Number: [id number] Date of Birth/Sex: 08/21/1923 (84 y.o. F) Treating RN: Montey Hora Primary Care Abdul Beirne: Fulton Reek Other Clinician: Referring Amario Longmore: Fulton Reek Treating Jannet Calip/Extender: Melburn Hake, HOYT Weeks in Treatment: 5 Encounter Discharge Information Items Discharge Condition: Stable Ambulatory Status: Walker Discharge Destination: Home Transportation: Private Auto Accompanied By: dtr Schedule Follow-up Appointment: No Clinical Summary of Care: Electronic Signature(s) Signed: 03/13/2018 2:01:01 PM By: Montey Hora Entered By: Montey Hora on 03/13/2018 14:01:01 Naves, Zaneta Johnette Abraham ([id number]) -------------------------------------------------------------------------------- Lower Extremity Assessment Details Patient Name: [name] Date of Service: 03/13/2018 1:00 PM Medical Record Number: [id number] Patient Account Number: [id number] Date of Birth/Sex: 04-21-1923 (84 y.o. F) Treating RN: Ahmed Prima Primary Care Elie Gragert: Fulton Reek Other Clinician: Referring Maely Clements: Fulton Reek Treating Nyeema Want/Extender: Melburn Hake, HOYT Weeks in Treatment: 5 Edema Assessment Assessed: [Left: No] [Right: No] [Left: Edema] [Right: :] Calf Left: Right: Point of Measurement: 30 cm From Medial Instep 31 cm cm Ankle Left: Right: Point of Measurement: 10 cm From Medial Instep 23.5 cm cm Vascular Assessment Pulses: Dorsalis Pedis Palpable: [Left:Yes] Posterior Tibial Extremity colors, hair growth, and conditions: Extremity Color: [Left:Hyperpigmented] Temperature of Extremity: [Left:Warm] Capillary Refill: [Left:< 3 seconds] Toe Nail Assessment Left: Right: Thick: No Discolored: Yes Deformed: No Improper Length and Hygiene: No Electronic Signature(s) Signed: 03/13/2018 5:33:19 PM By: Alric Quan Entered By: Alric Quan on 03/13/2018 13:17:49 Cryderman, Annais EMarland Kitchen  ([id number]) -------------------------------------------------------------------------------- Multi Wound Chart Details Patient Name: [name] Date of Service: 03/13/2018 1:00 PM Medical Record Number: [id number] Patient Account Number: [id number] Date of Birth/Sex: [birth year] (84 y.o. F) Treating RN: Roger Shelter Primary Care Aliesha Dolata: Fulton Reek Other Clinician: Referring Lassie Demorest: Fulton Reek Treating Daulton Harbaugh/Extender: Melburn Hake, HOYT Weeks in Treatment: 5 Vital Signs Height(in): 66 Pulse(bpm): 66 Weight(lbs): 134 Blood Pressure(mmHg): 124/49 Body Mass Index(BMI): 22 Temperature(F): 98.2 Respiratory Rate  16 (breaths/min): Photos: [2:No Photos] [3:No Photos] [N/A:N/A] Wound Location: [2:Left Forearm] [3:Left, Posterior Lower Leg] [N/A:N/A] Wounding Event: [2:Trauma] [3:Gradually Appeared] [N/A:N/A] Primary Etiology: [2:Trauma, Other] [3:Venous Leg Ulcer] [N/A:N/A] Comorbid History: [2:Cataracts, Chronic Obstructive Cataracts, Chronic Obstructive N/A Pulmonary Disease (COPD), Pulmonary Disease (COPD), Deep Vein Thrombosis, Hypertension, Myocardial Infarction, Osteoarthritis] [3:Deep Vein Thrombosis, Hypertension,  Myocardial Infarction, Osteoarthritis] Date Acquired: [2:02/01/2018] [3:12/05/2017] [N/A:N/A] Weeks of Treatment: [2:5] [3:5] [N/A:N/A] Wound Status: [2:Healed - Epithelialized] [3:Healed - Epithelialized] [N/A:N/A] Measurements L x W x D [2:0x0x0] [3:0x0x0] [N/A:N/A] (cm) Area (cm) : [2:0] [3:0] [N/A:N/A] Volume (cm) : [2:0] [3:0] [N/A:N/A] % Reduction in Area: [2:100.00%] [3:100.00%] [N/A:N/A] % Reduction in Volume: [2:100.00%] [3:100.00%] [N/A:N/A] Classification: [2:Partial Thickness] [3:Full Thickness Without Exposed Support Structures] [N/A:N/A] Exudate Amount: [2:None Present] [3:None Present] [N/A:N/A] Wound Margin: [2:Flat and Intact] [3:Flat and Intact] [N/A:N/A] Granulation Amount: [2:None Present (0%)] [3:None Present (0%)]  [N/A:N/A] Necrotic Amount: [2:None Present (0%)] [3:None Present (0%)] [N/A:N/A] Epithelialization: [2:None] [3:None] [N/A:N/A] Periwound Skin Texture: [2:No Abnormalities Noted] [3:No Abnormalities Noted] [N/A:N/A] Periwound Skin Moisture: [2:No Abnormalities Noted] [3:No Abnormalities Noted] [N/A:N/A] Periwound Skin Color: [2:No Abnormalities Noted] [3:No Abnormalities Noted] [N/A:N/A] Temperature: [2:N/A] [3:No Abnormality] [N/A:N/A] Tenderness on Palpation: [2:No] [3:No] [N/A:N/A] Wound Preparation: [2:Ulcer Cleansing: Rinsed/Irrigated with Saline Topical Anesthetic Applied: None] [3:Ulcer Cleansing: Rinsed/Irrigated with Saline, Other: soap and water Topical Anesthetic Applied: Other: lidocaine 4%] [N/A:N/A] Treatment Notes Electronic Signature(s) Signed: 03/14/2018 11:46:23 AM By: Roger Shelter Entered By: Roger Shelter on 03/13/2018 13:38:52 Leibensperger, Charlyne Quale ([id number]) -------------------------------------------------------------------------------- Multi-Disciplinary Care Plan Details Patient Name: [name] Date of Service: 03/13/2018 1:00 PM Medical Record Number: [id number] Patient Account Number: [id number] Date of Birth/Sex: 23-Feb-1923 (84 y.o. F) Treating RN: Roger Shelter Primary Care Adelynn Gipe: Fulton Reek Other Clinician: Referring Akasha Melena: Fulton Reek Treating Kemari Mares/Extender: Melburn Hake, HOYT Weeks in Treatment: 5 Active Inactive Electronic Signature(s) Signed: 03/14/2018 5:01:02 PM By: Gretta Cool, BSN, RN, CWS, Kim RN, BSN Signed: 03/15/2018 5:14:04 PM By: Roger Shelter Previous Signature: 03/14/2018 11:46:23 AM Version By: Roger Shelter Entered By: Gretta Cool BSN, RN, CWS, Kim on 03/14/2018 17:01:01 LADELLE, TEODORO ([id number]) -------------------------------------------------------------------------------- Pain Assessment Details Patient Name: [name] Date of Service: 03/13/2018 1:00 PM Medical Record Number: [id number] Patient Account Number:  [id number] Date of Birth/Sex: 1922-09-29 (84 y.o. F) Treating RN: Ahmed Prima Primary Care Tauheedah Bok: Fulton Reek Other Clinician: Referring Deryck Hippler: Fulton Reek Treating Lenorris Karger/Extender: Melburn Hake, HOYT Weeks in Treatment: 5 Active Problems Location of Pain Severity and Description of Pain Patient Has Paino No Site Locations Pain Management and Medication Current Pain Management: Electronic Signature(s) Signed: 03/13/2018 5:33:19 PM By: Alric Quan Entered By: Alric Quan on 03/13/2018 13:09:03 Carreiro, Charlyne Quale ([id number]) -------------------------------------------------------------------------------- Patient/Caregiver Education Details Patient Name: [name] Date of Service: 03/13/2018 1:00 PM Medical Record Number: [id number] Patient Account Number: [id number] Date of Birth/Gender: 21-Dec-1922 (84 y.o. F) Treating RN: Montey Hora Primary Care Physician: Fulton Reek Other Clinician: Referring Physician: Fulton Reek Treating Physician/Extender: Sharalyn Ink in Treatment: 5 Education Assessment Education Provided To: Patient and Caregiver Education Topics Provided Venous: Handouts: Other: how to apply juxtalite wraps Methods: Demonstration, Explain/Verbal Responses: State content correctly Electronic Signature(s) Signed: 03/13/2018 5:34:11 PM By: Montey Hora Entered By: Montey Hora on 03/13/2018 14:01:24 Rossini, Lanice EMarland Kitchen ([id number]) -------------------------------------------------------------------------------- Wound Assessment Details Patient Name: [name] Date of Service: 03/13/2018 1:00 PM Medical Record Number: [id number] Patient Account Number: [id number] Date of Birth/Sex: 06-12-1923 (84 y.o. F) Treating RN: Roger Shelter Primary Care Mackey Varricchio: Fulton Reek Other Clinician: Referring Daneesha Quinteros: Fulton Reek  Treating Taesha Goodell/Extender: STONE III, HOYT Weeks in Treatment: 5 Wound Status Wound Number: 2  Primary Trauma, Other Etiology: Wound Location: Left Forearm Wound Healed - Epithelialized Wounding Event: Trauma Status: Date Acquired: 02/01/2018 Comorbid Cataracts, Chronic Obstructive Pulmonary Weeks Of Treatment: 5 History: Disease (COPD), Deep Vein Thrombosis, Clustered Wound: No Hypertension, Myocardial Infarction, Osteoarthritis Photos Photo Uploaded By: Alric Quan on 03/13/2018 15:18:12 Wound Measurements Length: (cm) 0 % Re Width: (cm) 0 % Re Depth: (cm) 0 Epit Area: (cm) 0 Tun Volume: (cm) 0 Und duction in Area: 100% duction in Volume: 100% helialization: None neling: No ermining: No Wound Description Classification: Partial Thickness Wound Margin: Flat and Intact Exudate Amount: None Present Foul Odor After Cleansing: No Slough/Fibrino No Wound Bed Granulation Amount: None Present (0%) Necrotic Amount: None Present (0%) Periwound Skin Texture Texture Color No Abnormalities Noted: No No Abnormalities Noted: No Moisture No Abnormalities Noted: No Wound Preparation Ulcer Cleansing: Rinsed/Irrigated with Saline Topical Anesthetic Applied: None Chason, Samanthia E. ([id number]) Electronic Signature(s) Signed: 03/14/2018 11:46:23 AM By: Roger Shelter Entered By: Roger Shelter on 03/13/2018 13:38:32 Krauser, Robyn EMarland Kitchen ([id number]) -------------------------------------------------------------------------------- Wound Assessment Details Patient Name: [name] Date of Service: 03/13/2018 1:00 PM Medical Record Number: [id number] Patient Account Number: [id number] Date of Birth/Sex: [birth year] (84 y.o. F) Treating RN: Roger Shelter Primary Care Esker Dever: Fulton Reek Other Clinician: Referring Demarquez Ciolek: Fulton Reek Treating Amarianna Abplanalp/Extender: Melburn Hake, HOYT Weeks in Treatment: 5 Wound Status Wound Number: 3 Primary Venous Leg Ulcer Etiology: Wound Location: Left, Posterior Lower Leg Wound Healed - Epithelialized Wounding Event:  Gradually Appeared Status: Date Acquired: 12/05/2017 Comorbid Cataracts, Chronic Obstructive Pulmonary Weeks Of Treatment: 5 History: Disease (COPD), Deep Vein Thrombosis, Clustered Wound: No Hypertension, Myocardial Infarction, Osteoarthritis Photos Photo Uploaded By: Alric Quan on 03/13/2018 15:18:12 Wound Measurements Length: (cm) 0 Width: (cm) 0 Depth: (cm) 0 Area: (cm) 0 Volume: (cm) 0 % Reduction in Area: 100% % Reduction in Volume: 100% Epithelialization: None Tunneling: No Undermining: No Wound Description Full Thickness Without Exposed Support Classification: Structures Wound Margin: Flat and Intact Exudate None Present Amount: Foul Odor After Cleansing: No Slough/Fibrino No Wound Bed Granulation Amount: None Present (0%) Exposed Structure Necrotic Amount: None Present (0%) Fascia Exposed: No Fat Layer (Subcutaneous Tissue) Exposed: No Tendon Exposed: No Muscle Exposed: No Joint Exposed: No Bone Exposed: No Periwound Skin Texture Texture Color No Abnormalities Noted: No No Abnormalities Noted: No Brigham, Anisah E. ([id number]) Moisture Temperature / Pain No Abnormalities Noted: No Temperature: No Abnormality Wound Preparation Ulcer Cleansing: Rinsed/Irrigated with Saline, Other: soap and water, Topical Anesthetic Applied: Other: lidocaine 4%, Electronic Signature(s) Signed: 03/14/2018 11:46:23 AM By: Roger Shelter Entered By: Roger Shelter on 03/13/2018 13:38:32 Dollins, Kerrilyn Johnette Abraham ([id number]) -------------------------------------------------------------------------------- Vitals Details Patient Name: [name] Date of Service: 03/13/2018 1:00 PM Medical Record Number: [id number] Patient Account Number: [id number] Date of Birth/Sex: 01/05/1923 (84 y.o. F) Treating RN: Ahmed Prima Primary Care Glen Blatchley: Fulton Reek Other Clinician: Referring Eda Magnussen: Fulton Reek Treating Aaran Enberg/Extender: Melburn Hake, HOYT Weeks in  Treatment: 5 Vital Signs Time Taken: 13:09 Temperature (F): 98.2 Height (in): 66 Pulse (bpm): 66 Weight (lbs): 134 Respiratory Rate (breaths/min): 16 Body Mass Index (BMI): 21.6 Blood Pressure (mmHg): 124/49 Reference Range: 80 - 120 mg / dl Notes rechecked BP manually and it was 120/50. Veneda Melter, Pa-C aware of same Electronic Signature(s) Signed: 03/13/2018 5:33:19 PM By: Alric Quan Entered By: Alric Quan on 03/13/2018 13:13:08

## 2018-03-14 NOTE — Progress Notes (Signed)
[name] ([id number]) Visit Report for 03/13/2018 Chief Complaint Document Details Patient Name: [name], [name] Date of Service: 03/13/2018 1:00 PM Medical Record Number: [id number] Patient Account Number: [id number] Date of Birth/Sex: [birth year] (84 y.o. F) Treating RN: Roger Shelter Primary Care Provider: Fulton Reek Other Clinician: Referring Provider: Fulton Reek Treating Provider/Extender: Melburn Hake, HOYT Weeks in Treatment: 5 Information Obtained from: Patient Chief Complaint Left forearm wound and left lower extremity ulcer Electronic Signature(s) Signed: 03/13/2018 8:46:34 PM By: Worthy Keeler PA-C Entered By: Worthy Keeler on 03/13/2018 13:09:05 Clasby, Jakeria EMarland Kitchen ([id number]) -------------------------------------------------------------------------------- HPI Details Patient Name: [name] Date of Service: 03/13/2018 1:00 PM Medical Record Number: [id number] Patient Account Number: [id number] Date of Birth/Sex: [birth year] (84 y.o. F) Treating RN: Roger Shelter Primary Care Provider: Fulton Reek Other Clinician: Referring Provider: Fulton Reek Treating Provider/Extender: Melburn Hake, HOYT Weeks in Treatment: 5 History of Present Illness HPI Description: 02/03/18 on evaluation today patient presents for initial evaluation and our clinic is referral from [facility] ER. She sustained a skin tear on Wednesday of this week, two days ago to the left forearm. Subsequently she is on Plavix due to a history of TIAs. Subsequently she does have a fairly significant bruising to the left forearm. This occurred when she was walking in her garden and her caretaker who was with her noted that she began to fall and attempted to grab her in order to prevent the fall subsequently causing a skin tear on the left forearm. With that being said this is causing some discomfort to the patient she tells me. In regard to left lateral cath ulcer this is  something that arose gradually 6 to 8 weeks ago. She has seen dermatology they recommended Vaseline. Subsequently she saw her cardiologist and they recommended triple antibiotic ointment. Unfortunately neither has really improved her overall wound status at this point. She has been tolerating the Steri-Strips and dressing changes to left forearm and again no dressing has really been applied to the left lateral lower extremity. She does have bilateral lower extremity edema. 02/10/18-She is here in follow-up evaluation for a left posterior calf ulcer and left forearm skin tear. The remaining Steri-Strips were removed from the left forearm, there is residual hematoma that was incised and drained. She tolerated debridement to the posterior calf. We will increase compression and she will follow-up next week 02/17/18 on evaluation today patient appears to be doing rather well in regard to her left upper extremity ulcer. The skin tear is actually healing quite nicely in the majority possibly even 90% of the skin reattached on inspection today. This is obviously excellent news. With that being said her left lateral lower Trinity also is continuing to do well the Iodoflex does seem to be beneficial. She's been tolerating this without any significant discomfort at this point she tells me. 02/24/18 on evaluation today patient appears to be doing very well in regard to her left forearm ulcer as well as her left lateral lower extremity ulcer. She has been tolerating the dressing changes without complication including the wrap. At this point I think that she's showing signs of excellent healing and again there's no evidence of infection. 03/03/18 on evaluation today patient appears to show signs of excellent improvement at this point which is great news. This she has been tolerating the dressing changes well complication her left forearm is almost completely healed and her left lower extremity also shown signs of  excellent healing and improvement. Overall I'm  very happy with how things are going at this point. Patient likewise is also extremely pleased and is having much less discomfort than she has in the past. 03/13/18 on evaluation today patient actually appears to be doing very well. She has been tolerating the dressing changes without complication. With that being said it appears at this point today that both wounds on her left forearm and left lower leg appear to be healed. This is obviously excellent news and she is very happy to hear this Electronic Signature(s) Signed: 03/13/2018 8:46:34 PM By: Worthy Keeler PA-C Entered By: Worthy Keeler on 03/13/2018 20:16:19 AMORY, ZBIKOWSKI ([id number]) -------------------------------------------------------------------------------- Physical Exam Details Patient Name: [name] Date of Service: 03/13/2018 1:00 PM Medical Record Number: [id number] Patient Account Number: [id number] Date of Birth/Sex: [birth year] (84 y.o. F) Treating RN: Roger Shelter Primary Care Provider: Fulton Reek Other Clinician: Referring Provider: Fulton Reek Treating Provider/Extender: Melburn Hake, HOYT Weeks in Treatment: 5 Constitutional Well-nourished and well-hydrated in no acute distress. Respiratory normal breathing without difficulty. Psychiatric this patient is able to make decisions and demonstrates good insight into disease process. Alert and Oriented x 3. pleasant and cooperative. Notes There no wound openings at this point and the patient is having no discomfort which is excellent. Electronic Signature(s) Signed: 03/13/2018 8:46:34 PM By: Worthy Keeler PA-C Entered By: Worthy Keeler on 03/13/2018 20:16:50 PAISLEE, SZATKOWSKI ([id number]) -------------------------------------------------------------------------------- Physician Orders Details Patient Name: [name] Date of Service: 03/13/2018 1:00 PM Medical Record Number: [id number] Patient  Account Number: [id number] Date of Birth/Sex: 23-Jan-1923 (84 y.o. F) Treating RN: Roger Shelter Primary Care Provider: Fulton Reek Other Clinician: Referring Provider: Fulton Reek Treating Provider/Extender: Melburn Hake, HOYT Weeks in Treatment: 5 Verbal / Phone Orders: No Diagnosis Coding ICD-10 Coding Code Description I87.312 Chronic venous hypertension (idiopathic) with ulcer of left lower extremity L97.222 Non-pressure chronic ulcer of left calf with fat layer exposed S51.802S Unspecified open wound of left forearm, sequela I25.10 Atherosclerotic heart disease of native coronary artery without angina pectoris G45.9 Transient cerebral ischemic attack, unspecified Z79.01 Long term (current) use of anticoagulants J44.9 Chronic obstructive pulmonary disease, unspecified Z95.0 Presence of cardiac pacemaker Skin Barriers/Peri-Wound Care o Moisturizing lotion Discharge From [facility] o Discharge from [place] - treatment completed Notes wear juxtalite wrap from time awaken in am to evening before going to bed. wear daily for one month. then may wear if having edema in lower leg. use sun block on new skin areas and long sleeves for 2 weeks. protect skin. Electronic Signature(s) Signed: 03/13/2018 8:46:34 PM By: Worthy Keeler PA-C Signed: 03/14/2018 11:46:23 AM By: Roger Shelter Entered By: Roger Shelter on 03/13/2018 13:42:56 Placencia, Reagen Johnette Abraham ([id number]) -------------------------------------------------------------------------------- Problem List Details Patient Name: [name] Date of Service: 03/13/2018 1:00 PM Medical Record Number: [id number] Patient Account Number: [id number] Date of Birth/Sex: [birth year] (84 y.o. F) Treating RN: Roger Shelter Primary Care Provider: Fulton Reek Other Clinician: Referring Provider: Fulton Reek Treating Provider/Extender: Melburn Hake, HOYT Weeks in Treatment: 5 Active Problems ICD-10 Evaluated  Encounter Code Description Active Date Today Diagnosis I87.312 Chronic venous hypertension (idiopathic) with ulcer of left 02/03/2018 No Yes lower extremity L97.222 Non-pressure chronic ulcer of left calf with fat layer exposed 02/03/2018 No Yes S51.802S Unspecified open wound of left forearm, sequela 02/03/2018 No Yes I25.10 Atherosclerotic heart disease of native coronary artery 02/03/2018 No Yes without angina pectoris G45.9 Transient cerebral ischemic attack, unspecified 02/03/2018 No Yes Z79.01 Long term (current) use of anticoagulants 02/03/2018  No Yes J44.9 Chronic obstructive pulmonary disease, unspecified 02/03/2018 No Yes Z95.0 Presence of cardiac pacemaker 02/03/2018 No Yes Inactive Problems Resolved Problems Electronic Signature(s) Signed: 03/13/2018 8:46:34 PM By: Mena Goes, Jamiyla E. ([id number]) Entered By: Worthy Keeler on 03/13/2018 13:08:57 Zertuche, Tanaiya EMarland Kitchen ([id number]) -------------------------------------------------------------------------------- Progress Note Details Patient Name: [name] Date of Service: 03/13/2018 1:00 PM Medical Record Number: [id number] Patient Account Number: [id number] Date of Birth/Sex: [birth year] (84 y.o. F) Treating RN: Roger Shelter Primary Care Provider: Fulton Reek Other Clinician: Referring Provider: Fulton Reek Treating Provider/Extender: Melburn Hake, HOYT Weeks in Treatment: 5 Subjective Chief Complaint Information obtained from Patient Left forearm wound and left lower extremity ulcer History of Present Illness (HPI) 02/03/18 on evaluation today patient presents for initial evaluation and our clinic [facility]. She sustained a skin tear on Wednesday of this week, two days ago to the left forearm. Subsequently she is on Plavix due to a history of TIAs. Subsequently she does have a fairly significant bruising to the left forearm. This occurred when she was walking in  her garden and her caretaker who was with her noted that she began to fall and attempted to grab her in order to prevent the fall subsequently causing a skin tear on the left forearm. With that being said this is causing some discomfort to the patient she tells me. In regard to left lateral cath ulcer this is something that arose gradually 6 to 8 weeks ago. She has seen dermatology they recommended Vaseline. Subsequently she saw her cardiologist and they recommended triple antibiotic ointment. Unfortunately neither has really improved her overall wound status at this point. She has been tolerating the Steri-Strips and dressing changes to left forearm and again no dressing has really been applied to the left lateral lower extremity. She does have bilateral lower extremity edema. 02/10/18-She is here in follow-up evaluation for a left posterior calf ulcer and left forearm skin tear. The remaining Steri-Strips were removed from the left forearm, there is residual hematoma that was incised and drained. She tolerated debridement to the posterior calf. We will increase compression and she will follow-up next week 02/17/18 on evaluation today patient appears to be doing rather well in regard to her left upper extremity ulcer. The skin tear is actually healing quite nicely in the majority possibly even 90% of the skin reattached on inspection today. This is obviously excellent news. With that being said her left lateral lower Trinity also is continuing to do well the Iodoflex does seem to be beneficial. She's been tolerating this without any significant discomfort at this point she tells me. 02/24/18 on evaluation today patient appears to be doing very well in regard to her left forearm ulcer as well as her left lateral lower extremity ulcer. She has been tolerating the dressing changes without complication including the wrap. At this point I think that she's showing signs of excellent healing and again there's  no evidence of infection. 03/03/18 on evaluation today patient appears to show signs of excellent improvement at this point which is great news. This she has been tolerating the dressing changes well complication her left forearm is almost completely healed and her left lower extremity also shown signs of excellent healing and improvement. Overall I'm very happy with how things are going at this point. Patient likewise is also extremely pleased and is having much less discomfort than she has in the past. 03/13/18 on evaluation today patient actually  appears to be doing very well. She has been tolerating the dressing changes without complication. With that being said it appears at this point today that both wounds on her left forearm and left lower leg appear to be healed. This is obviously excellent news and she is very happy to hear this Patient History Information obtained from Patient. Family History Cancer - Child, Hypertension - Child, Thyroid Problems - Child, No family history of Diabetes, Heart Disease, Kidney Disease, Lung Disease, Seizures, Stroke, Tuberculosis. Social History KIMESHA, CLAXTON ([id number]) Never smoker, Marital Status - Widowed, Alcohol Use - Never, Drug Use - No History, Caffeine Use - Daily. Medical History Hospitalization/Surgery History - 12/19/2017, [facility], TIA. 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 breast biopsy (most recent 2004)- no CA Review of Systems (ROS) Constitutional Symptoms (General Health) Denies complaints or symptoms of Fever, Chills. Psychiatric The patient has no complaints or symptoms. Objective Constitutional Well-nourished and well-hydrated in no acute distress. Vitals Time Taken:  1:09 PM, Height: 66 in, Weight: 134 lbs, BMI: 21.6, Temperature: 98.2 F, Pulse: 66 bpm, Respiratory Rate: 16 breaths/min, Blood Pressure: 124/49 mmHg. General Notes: rechecked BP manually and it was 120/50. Made Margarita Grizzle, Pa-C aware of same Respiratory normal breathing without difficulty. Psychiatric this patient is able to make decisions and demonstrates good insight into disease process. Alert and Oriented x 3. pleasant and cooperative. General Notes: There no wound openings at this point and the patient is having no discomfort which is excellent. Integumentary (Hair, Skin) Wound #2 status is Healed - Epithelialized. Original cause of wound was Trauma. The wound is located on the Left Forearm. The wound measures 0cm length x 0cm width x 0cm depth; 0cm^2 area and 0cm^3 volume. There is no tunneling or undermining noted. There is a none present amount of drainage noted. The wound margin is flat and intact. There is no granulation within the wound bed. There is no necrotic tissue within the wound bed. [place], Sharon ([id number]) Wound #3 status is Healed - Epithelialized. Original cause of wound was Gradually Appeared. The wound is located on the Left,Posterior Lower Leg. The wound measures 0cm length x 0cm width x 0cm depth; 0cm^2 area and 0cm^3 volume. There is no tunneling or undermining noted. There is a none present amount of drainage noted. The wound margin is flat and intact. There is no granulation within the wound bed. There is no necrotic tissue within the wound bed. Periwound temperature was noted as No Abnormality. Assessment Active Problems ICD-10 Chronic venous hypertension (idiopathic) with ulcer of left lower extremity Non-pressure chronic ulcer of left calf with fat layer exposed Unspecified open wound of left forearm, sequela Atherosclerotic heart disease of native coronary artery without angina pectoris Transient cerebral ischemic attack, unspecified Long term (current)  use of anticoagulants Chronic obstructive pulmonary disease, unspecified Presence of cardiac pacemaker Plan Skin Barriers/Peri-Wound Care: Moisturizing lotion Discharge From [facility]: Discharge from [place] - treatment completed General Notes: wear juxtalite wrap from time awaken in am to evening before going to bed. wear daily for one month. then may wear if having edema in lower leg. use sun block on new skin areas and long sleeves for 2 weeks. protect skin. At this point I recommended that we discontinue wound care services patient is in agreement the plan. I did advise her to  utilize longsleeve shirts over the next two weeks to help protect the newly healed area. Follow that point she is going to utilize some block over any newly healed spot in order to prevent sunburn. I explained this is much more prone to that. She's in agreement the plan. We will otherwise see her back for reevaluation as needed in the future if anything changes. Electronic Signature(s) Signed: 03/13/2018 8:46:34 PM By: Worthy Keeler PA-C Entered By: Worthy Keeler on 03/13/2018 20:17:35 KANYLA, OMEARA ([id number]) -------------------------------------------------------------------------------- ROS/PFSH Details Patient Name: [name] Date of Service: 03/13/2018 1:00 PM Medical Record Number: [id number] Patient Account Number: [id number] Date of Birth/Sex: February 11, 1923 (84 y.o. F) Treating RN: Roger Shelter Primary Care Provider: Fulton Reek Other Clinician: Referring Provider: Fulton Reek Treating Provider/Extender: Melburn Hake, HOYT Weeks in Treatment: 5 Information Obtained From Patient Wound History Do you currently have one or more open woundso Yes How many open wounds do you currently haveo 2 Approximately how long have you had your woundso 1 month How have you been treating your wound(s) until nowo neosporin Has your wound(s) ever healed and then re-openedo No Have you had  any lab work done in the past montho No Have you tested positive for an antibiotic resistant organism (MRSA, VRE)o No Have you tested positive for osteomyelitis (bone infection)o No Have you had any tests for circulation on your legso No Constitutional Symptoms (General Health) Complaints and Symptoms: Negative for: Fever; Chills Eyes Medical History: Positive for: Cataracts - removed 2004 from R eye and L eye 2009 and 2014 Negative for: Glaucoma; Optic Neuritis Respiratory Medical History: Positive for: Chronic Obstructive Pulmonary Disease (COPD) Negative for: Aspiration; Asthma; Pneumothorax; Sleep Apnea; Tuberculosis Past Medical History Notes: PE 2007 Cardiovascular Medical History: Positive for: Deep Vein Thrombosis - 03/2006 R leg; Hypertension; Myocardial Infarction - 09/2005 Negative for: Angina; Arrhythmia; Congestive Heart Failure; Coronary Artery Disease; Hypotension; Peripheral Arterial Disease; Peripheral Venous Disease; Phlebitis; Vasculitis Past Medical History Notes: pacemaker placed 01/2011 Gastrointestinal Medical History: Negative for: Cirrhosis ; Colitis; Crohnos; Hepatitis A; Hepatitis B; Hepatitis C Past Medical History Notes: GERD; diverticuitis; colon spams; appendectomy 1935 Endocrine CHARNAE, LILL. ([id number]) Medical History: Negative for: Type I Diabetes; Type II Diabetes Genitourinary Medical History: Negative for: End Stage Renal Disease Immunological Medical History: Negative for: Lupus Erythematosus; Raynaudos; Scleroderma Integumentary (Skin) Medical History: Negative for: History of Burn; History of pressure wounds Musculoskeletal Medical History: Positive for: Osteoarthritis Negative for: Gout; Rheumatoid Arthritis; Osteomyelitis Past Medical History Notes: scoliosis; bulging discs Neurologic Medical History: Negative for: Dementia; Neuropathy; Quadriplegia; Seizure Disorder Past Medical History Notes: TIA's  2007,2013,2014 Oncologic Medical History: Negative for: Received Chemotherapy; Received Radiation Past Medical History Notes: Skin CA- squamous cell carcinoma 2014 R leg; mult. breast biopsy (most recent 2004)- no CA Psychiatric Complaints and Symptoms: No Complaints or Symptoms Medical History: Negative for: Anorexia/bulimia; Confinement Anxiety HBO Extended History Items Eyes: Cataracts Immunizations Pneumococcal Vaccine: Received Pneumococcal Vaccination: Yes Tetanus Vaccine: Last tetanus shot: 03/29/2012 Immunization Notes: REVA, PINKLEY ([id number]) Flu Shot 10/15; Pneumona 2013 Implantable Devices Hospitalization / Surgery History Name of Hospital Purpose of Hospitalization/Surgery Date [facility] TIA 12/19/2017 Family and Social History Cancer: Yes - Child; Diabetes: No; Heart Disease: No; Hypertension: Yes - Child; Kidney Disease: No; Lung Disease: No; Seizures: No; Stroke: No; Thyroid Problems: Yes - Child; Tuberculosis: No; Never smoker; Marital Status - Widowed; Alcohol Use: Never; Drug Use: No History; Caffeine Use: Daily; Financial Concerns: No; Food, Clothing or Shelter Needs: No; Support System Lacking:  No; Transportation Concerns: No; Advanced Directives: Yes (Not Provided); Patient does not want information on Advanced Directives; Do not resuscitate: Yes (Not Provided); Living Will: Yes (Not Provided); Medical Power of Attorney: Yes - Williams Che- daughter (Not Provided) Physician Affirmation I have reviewed and agree with the above information. Electronic Signature(s) Signed: 03/13/2018 8:46:34 PM By: Worthy Keeler PA-C Signed: 03/14/2018 11:46:23 AM By: Roger Shelter Entered By: Worthy Keeler on 03/13/2018 20:16:32 MELIYA, MCCONAHY ([id number]) -------------------------------------------------------------------------------- SuperBill Details Patient Name: [name] Date of Service: 03/13/2018 Medical Record Number: [id number] Patient Account Number:  [id number] Date of Birth/Sex: [birth year] (84 y.o. F) Treating RN: Roger Shelter Primary Care Provider: Fulton Reek Other Clinician: Referring Provider: Fulton Reek Treating Provider/Extender: Melburn Hake, HOYT Weeks in Treatment: 5 Diagnosis Coding ICD-10 Codes Code Description [phone] Chronic venous hypertension (idiopathic) with ulcer of left lower extremity L97.222 Non-pressure chronic ulcer of left calf with fat layer exposed S51.802S Unspecified open wound of left forearm, sequela I25.10 Atherosclerotic heart disease of native coronary artery without angina pectoris G45.9 Transient cerebral ischemic attack, unspecified Z79.01 Long term (current) use of anticoagulants J44.9 Chronic obstructive pulmonary disease, unspecified Z95.0 Presence of cardiac pacemaker Facility Procedures CPT4 Code: [id number] Description: [phone] - WOUND CARE VISIT-LEV 2 EST PT Modifier: Quantity: 1 Physician Procedures CPT4 Code Description: [id number] - WC PHYS LEVEL 2 - EST PT ICD-10 Diagnosis Description I87.312 Chronic venous hypertension (idiopathic) with ulcer of left lo L97.222 Non-pressure chronic ulcer of left calf with fat layer exposed S51.802S  Unspecified open wound of left forearm, sequela I25.10 Atherosclerotic heart disease of native coronary artery withou Modifier: wer extremity t angina pectori Quantity: 1 s Electronic Signature(s) Signed: 03/13/2018 8:46:34 PM By: Worthy Keeler PA-C Entered By: Worthy Keeler on 03/13/2018 20:17:54

## 2018-05-24 ENCOUNTER — Emergency Department: Payer: Medicare Other

## 2018-05-24 ENCOUNTER — Emergency Department
Admission: EM | Admit: 2018-05-24 | Discharge: 2018-05-24 | Disposition: A | Discharge status: Home / Self Care | Payer: Medicare Other | Attending: Emergency Medicine | Admitting: Emergency Medicine

## 2018-05-24 ENCOUNTER — Encounter: Payer: Self-pay | Admitting: Intensive Care

## 2018-05-24 ENCOUNTER — Other Ambulatory Visit: Payer: Self-pay

## 2018-05-24 DIAGNOSIS — S41112A Laceration without foreign body of left upper arm, initial encounter: Secondary | ICD-10-CM | POA: Insufficient documentation

## 2018-05-24 DIAGNOSIS — Y999 Unspecified external cause status: Secondary | ICD-10-CM | POA: Insufficient documentation

## 2018-05-24 DIAGNOSIS — Y92009 Unspecified place in unspecified non-institutional (private) residence as the place of occurrence of the external cause: Secondary | ICD-10-CM | POA: Insufficient documentation

## 2018-05-24 DIAGNOSIS — Z950 Presence of cardiac pacemaker: Secondary | ICD-10-CM | POA: Insufficient documentation

## 2018-05-24 DIAGNOSIS — Z8673 Personal history of transient ischemic attack (TIA), and cerebral infarction without residual deficits: Secondary | ICD-10-CM | POA: Diagnosis not present

## 2018-05-24 DIAGNOSIS — W19XXXA Unspecified fall, initial encounter: Secondary | ICD-10-CM

## 2018-05-24 DIAGNOSIS — I2510 Atherosclerotic heart disease of native coronary artery without angina pectoris: Secondary | ICD-10-CM | POA: Insufficient documentation

## 2018-05-24 DIAGNOSIS — Y93E5 Activity, floor mopping and cleaning: Secondary | ICD-10-CM | POA: Diagnosis not present

## 2018-05-24 DIAGNOSIS — W0110XA Fall on same level from slipping, tripping and stumbling with subsequent striking against unspecified object, initial encounter: Secondary | ICD-10-CM | POA: Diagnosis not present

## 2018-05-24 DIAGNOSIS — E039 Hypothyroidism, unspecified: Secondary | ICD-10-CM | POA: Insufficient documentation

## 2018-05-24 DIAGNOSIS — J449 Chronic obstructive pulmonary disease, unspecified: Secondary | ICD-10-CM | POA: Insufficient documentation

## 2018-05-24 DIAGNOSIS — S0990XA Unspecified injury of head, initial encounter: Secondary | ICD-10-CM | POA: Insufficient documentation

## 2018-05-24 DIAGNOSIS — S4992XA Unspecified injury of left shoulder and upper arm, initial encounter: Secondary | ICD-10-CM | POA: Diagnosis present

## 2018-05-24 NOTE — ED Provider Notes (Signed)
[facility] Emergency Department Provider Note       Time seen: ----------------------------------------- 4:03 PM on 05/24/2018 -----------------------------------------   I have reviewed the triage vital signs and the nursing notes.  HISTORY   Chief Complaint Fall    HPI [name] is a 84 y.o. female with a history of arthritis, coronary disease, COPD, DVT, MI, PE, CVA who presents to the ED for a fall.  Patient reports she was at home dusting when she fell tripping over her feet.  She denies passing out.  She does not think she hit her head but her family is convinced that she did think.  She sustained multiple skin tears to her left arm.  She denies any weakness or specific pain.  She presents for further evaluation concerning same.  Past Medical History:  Diagnosis Date  . Arthritis   . Blood clot in vein   . CAD (coronary artery disease)   . Cancer (HCC)    squamous cell carcinoma  . COPD (chronic obstructive pulmonary disease) ([place])   . Diverticulitis   . DVT (deep vein thrombosis) in pregnancy ([place])   . GERD (gastroesophageal reflux disease)   . Myocardial infarction ([place])   . Pacemaker   . PE (pulmonary thromboembolism) ([place])   . Scoliosis   . Stroke ([place])   . Thyroid disease   . TIA (transient ischemic attack)     Patient Active Problem List   Diagnosis Date Noted  . Hallucinations 12/09/2017  . CAD (coronary artery disease) 12/09/2017  . Hypothyroidism 12/09/2017  . COPD (chronic obstructive pulmonary disease) ([place]) 12/09/2017  . Acute renal failure superimposed on stage 3 chronic kidney disease ([place]) 12/09/2017  . GERD (gastroesophageal reflux disease) 12/09/2017  . Chest pain 04/25/2017  . Chest pain, pleuritic 09/08/2015    Past Surgical History:  Procedure Laterality Date  . APPENDECTOMY    . BREAST SURGERY    . EYE SURGERY    . PACEMAKER IMPLANT    . TONSILLECTOMY      Allergies Adhesive [tape];  Clindamycin/lincomycin; Codeine; Demerol [meperidine]; Erythromycin; Morphine and related; Other; Penicillins; and Sulfa antibiotics  Social History Social History   Tobacco Use  . Smoking status: Never Smoker  . Smokeless tobacco: Never Used  Substance Use Topics  . Alcohol use: No  . Drug use: No   Review of Systems Constitutional: Negative for fever. Cardiovascular: Negative for chest pain. Respiratory: Negative for shortness of breath. Gastrointestinal: Negative for abdominal pain, vomiting and diarrhea. Musculoskeletal: Negative for back pain. Skin: Positive for skin tears on the left arm Neurological: Negative for headaches, focal weakness or numbness.  All systems negative/normal/unremarkable except as stated in the HPI  ____________________________________________   PHYSICAL EXAM:  VITAL SIGNS: ED Triage Vitals [05/24/18 1549]  Enc Vitals Group     BP (!) 147/66     Pulse Rate 61     Resp 16     Temp 98 F (36.7 C)     Temp Source Oral     SpO2 97 %     Weight 136 lb (61.7 kg)     Height 5\' 3"  (1.6 m)     Head Circumference      Peak Flow      Pain Score 6     Pain Loc      Pain Edu?      Excl. in [place]?    Constitutional: Alert and oriented. Well appearing and in no distress. Eyes: Conjunctivae are normal.  Normal extraocular movements. ENT   Head: Normocephalic and atraumatic.   Nose: No congestion/rhinnorhea.   Mouth/Throat: Mucous membranes are moist.   Neck: No stridor. Cardiovascular: Normal rate, regular rhythm. No murmurs, rubs, or gallops. Respiratory: Normal respiratory effort without tachypnea nor retractions. Breath sounds are clear and equal bilaterally. No wheezes/rales/rhonchi. Gastrointestinal: Soft and nontender. Normal bowel sounds Musculoskeletal: Nontender with normal range of motion in extremities. No lower extremity tenderness nor edema. Neurologic:  Normal speech and language. No gross focal neurologic deficits are  appreciated.  Skin: Numerous skin tears are noted on the left forearm and upper arm, approximately 20 cm total in length ____________________________________________  ED COURSE:  As part of my medical decision making, I reviewed the following data within the [place] History obtained from family if available, nursing notes, old chart and ekg, as well as notes from prior ED visits. Patient presented for a fall, we will assess with imaging and she will require wound closure of the skin tears   .Marland KitchenLaceration Repair Date/Time: 05/24/2018 5:27 PM Performed by: Earleen Newport, MD Authorized by: Earleen Newport, MD   Consent:    Consent obtained:  Verbal   Consent given by:  Patient   Risks discussed:  Infection, pain, retained foreign body, poor cosmetic result and poor wound healing Laceration details:    Length (cm):  20   Depth (mm):  3 Repair type:    Repair type:  Simple Exploration:    Hemostasis achieved with:  Direct pressure   Wound exploration: entire depth of wound probed and visualized     Contaminated: no   Treatment:    Area cleansed with:  Saline   Amount of cleaning:  Extensive   Irrigation solution:  Sterile saline   Visualized foreign bodies/material removed: no   Skin repair:    Repair method:  Tissue adhesive Approximation:    Approximation:  Close Post-procedure details:    Dressing:  Sterile dressing   Patient tolerance of procedure:  Tolerated well, no immediate complications   ____________________________________________   RADIOLOGY  CT head is unremarkable  ____________________________________________  DIFFERENTIAL DIAGNOSIS   Fall, minor head injury, subdural hematoma, cerebral contusion, skin tear  FINAL ASSESSMENT AND PLAN  Fall, minor head injury, skin tears   Plan: The patient had presented for a fall. Patient's imaging was negative.  Wound was closed as dictated above.  Overall good cosmetic result.  She  is cleared for outpatient follow-up.   Laurence Aly, MD   Note: This note was generated in part or whole with voice recognition software. Voice recognition is usually quite accurate but there are transcription errors that can and very often do occur. I apologize for any typographical errors that were not detected and corrected.     Earleen Newport, MD 05/24/18 1728

## 2018-05-24 NOTE — ED Triage Notes (Addendum)
Patient reports she was dusting and fell due to tripping over feet. Denies passing out. Denies hitting head. Patient has three skin tears present on L arm. Patient also c/o headache but reports she had headache before the fall. Patient takes eliquis daily. Strong bilateral grips. No weakness in legs. Speech clear.

## 2018-05-24 NOTE — ED Notes (Signed)
Pt taken to CT via stretcher.

## 2020-03-17 ENCOUNTER — Other Ambulatory Visit: Payer: Self-pay

## 2020-03-17 ENCOUNTER — Emergency Department: Payer: Medicare Other

## 2020-03-17 ENCOUNTER — Encounter: Payer: Self-pay | Admitting: Emergency Medicine

## 2020-03-17 ENCOUNTER — Inpatient Hospital Stay
Admission: EM | Admit: 2020-03-17 | Discharge: 2020-03-24 | DRG: 153 | Disposition: A | Discharge status: Skilled Nursing Facility | Payer: Medicare Other | Source: Skilled Nursing Facility | Attending: Internal Medicine | Admitting: Internal Medicine

## 2020-03-17 DIAGNOSIS — I495 Sick sinus syndrome: Secondary | ICD-10-CM | POA: Diagnosis present

## 2020-03-17 DIAGNOSIS — I252 Old myocardial infarction: Secondary | ICD-10-CM | POA: Diagnosis not present

## 2020-03-17 DIAGNOSIS — Z7189 Other specified counseling: Secondary | ICD-10-CM

## 2020-03-17 DIAGNOSIS — Z8673 Personal history of transient ischemic attack (TIA), and cerebral infarction without residual deficits: Secondary | ICD-10-CM

## 2020-03-17 DIAGNOSIS — J028 Acute pharyngitis due to other specified organisms: Secondary | ICD-10-CM | POA: Diagnosis present

## 2020-03-17 DIAGNOSIS — I4819 Other persistent atrial fibrillation: Secondary | ICD-10-CM | POA: Diagnosis present

## 2020-03-17 DIAGNOSIS — Z882 Allergy status to sulfonamides status: Secondary | ICD-10-CM

## 2020-03-17 DIAGNOSIS — Z7982 Long term (current) use of aspirin: Secondary | ICD-10-CM

## 2020-03-17 DIAGNOSIS — Z86711 Personal history of pulmonary embolism: Secondary | ICD-10-CM | POA: Diagnosis not present

## 2020-03-17 DIAGNOSIS — B9562 Methicillin resistant Staphylococcus aureus infection as the cause of diseases classified elsewhere: Secondary | ICD-10-CM | POA: Diagnosis not present

## 2020-03-17 DIAGNOSIS — R627 Adult failure to thrive: Secondary | ICD-10-CM | POA: Diagnosis present

## 2020-03-17 DIAGNOSIS — E860 Dehydration: Secondary | ICD-10-CM | POA: Diagnosis present

## 2020-03-17 DIAGNOSIS — E876 Hypokalemia: Secondary | ICD-10-CM | POA: Diagnosis present

## 2020-03-17 DIAGNOSIS — Z86718 Personal history of other venous thrombosis and embolism: Secondary | ICD-10-CM | POA: Diagnosis not present

## 2020-03-17 DIAGNOSIS — J449 Chronic obstructive pulmonary disease, unspecified: Secondary | ICD-10-CM | POA: Diagnosis present

## 2020-03-17 DIAGNOSIS — Z79890 Hormone replacement therapy (postmenopausal): Secondary | ICD-10-CM

## 2020-03-17 DIAGNOSIS — I2510 Atherosclerotic heart disease of native coronary artery without angina pectoris: Secondary | ICD-10-CM | POA: Diagnosis not present

## 2020-03-17 DIAGNOSIS — Z8701 Personal history of pneumonia (recurrent): Secondary | ICD-10-CM

## 2020-03-17 DIAGNOSIS — R54 Age-related physical debility: Secondary | ICD-10-CM | POA: Diagnosis present

## 2020-03-17 DIAGNOSIS — I480 Paroxysmal atrial fibrillation: Secondary | ICD-10-CM | POA: Diagnosis present

## 2020-03-17 DIAGNOSIS — K219 Gastro-esophageal reflux disease without esophagitis: Secondary | ICD-10-CM | POA: Diagnosis present

## 2020-03-17 DIAGNOSIS — Z20822 Contact with and (suspected) exposure to covid-19: Secondary | ICD-10-CM | POA: Diagnosis present

## 2020-03-17 DIAGNOSIS — G9349 Other encephalopathy: Secondary | ICD-10-CM | POA: Diagnosis present

## 2020-03-17 DIAGNOSIS — B349 Viral infection, unspecified: Secondary | ICD-10-CM | POA: Diagnosis not present

## 2020-03-17 DIAGNOSIS — N179 Acute kidney failure, unspecified: Secondary | ICD-10-CM | POA: Diagnosis present

## 2020-03-17 DIAGNOSIS — Z85828 Personal history of other malignant neoplasm of skin: Secondary | ICD-10-CM | POA: Diagnosis not present

## 2020-03-17 DIAGNOSIS — Z8249 Family history of ischemic heart disease and other diseases of the circulatory system: Secondary | ICD-10-CM | POA: Diagnosis not present

## 2020-03-17 DIAGNOSIS — R062 Wheezing: Secondary | ICD-10-CM

## 2020-03-17 DIAGNOSIS — J029 Acute pharyngitis, unspecified: Secondary | ICD-10-CM | POA: Diagnosis not present

## 2020-03-17 DIAGNOSIS — Z7952 Long term (current) use of systemic steroids: Secondary | ICD-10-CM

## 2020-03-17 DIAGNOSIS — W19XXXA Unspecified fall, initial encounter: Secondary | ICD-10-CM | POA: Diagnosis present

## 2020-03-17 DIAGNOSIS — Z79899 Other long term (current) drug therapy: Secondary | ICD-10-CM

## 2020-03-17 DIAGNOSIS — Z8614 Personal history of Methicillin resistant Staphylococcus aureus infection: Secondary | ICD-10-CM

## 2020-03-17 DIAGNOSIS — Z881 Allergy status to other antibiotic agents status: Secondary | ICD-10-CM

## 2020-03-17 DIAGNOSIS — Z66 Do not resuscitate: Secondary | ICD-10-CM

## 2020-03-17 DIAGNOSIS — N390 Urinary tract infection, site not specified: Secondary | ICD-10-CM | POA: Diagnosis not present

## 2020-03-17 DIAGNOSIS — G4700 Insomnia, unspecified: Secondary | ICD-10-CM | POA: Diagnosis present

## 2020-03-17 DIAGNOSIS — E039 Hypothyroidism, unspecified: Secondary | ICD-10-CM | POA: Diagnosis present

## 2020-03-17 DIAGNOSIS — T502X5A Adverse effect of carbonic-anhydrase inhibitors, benzothiadiazides and other diuretics, initial encounter: Secondary | ICD-10-CM | POA: Diagnosis not present

## 2020-03-17 DIAGNOSIS — Z7901 Long term (current) use of anticoagulants: Secondary | ICD-10-CM | POA: Diagnosis not present

## 2020-03-17 DIAGNOSIS — Z888 Allergy status to other drugs, medicaments and biological substances status: Secondary | ICD-10-CM

## 2020-03-17 DIAGNOSIS — R6510 Systemic inflammatory response syndrome (SIRS) of non-infectious origin without acute organ dysfunction: Secondary | ICD-10-CM | POA: Diagnosis present

## 2020-03-17 DIAGNOSIS — Z515 Encounter for palliative care: Secondary | ICD-10-CM

## 2020-03-17 DIAGNOSIS — R509 Fever, unspecified: Secondary | ICD-10-CM | POA: Diagnosis not present

## 2020-03-17 DIAGNOSIS — Z950 Presence of cardiac pacemaker: Secondary | ICD-10-CM | POA: Diagnosis not present

## 2020-03-17 DIAGNOSIS — Z79891 Long term (current) use of opiate analgesic: Secondary | ICD-10-CM

## 2020-03-17 DIAGNOSIS — G9340 Encephalopathy, unspecified: Secondary | ICD-10-CM | POA: Diagnosis not present

## 2020-03-17 DIAGNOSIS — Z880 Allergy status to penicillin: Secondary | ICD-10-CM

## 2020-03-17 DIAGNOSIS — Z91048 Other nonmedicinal substance allergy status: Secondary | ICD-10-CM

## 2020-03-17 DIAGNOSIS — Z885 Allergy status to narcotic agent status: Secondary | ICD-10-CM

## 2020-03-17 DIAGNOSIS — Z22322 Carrier or suspected carrier of Methicillin resistant Staphylococcus aureus: Secondary | ICD-10-CM

## 2020-03-17 LAB — URINALYSIS, ROUTINE W REFLEX MICROSCOPIC
Bilirubin Urine: NEGATIVE
Glucose, UA: NEGATIVE mg/dL
Ketones, ur: 20 mg/dL — AB
Nitrite: NEGATIVE
Protein, ur: NEGATIVE mg/dL
Specific Gravity, Urine: 1.021 (ref 1.005–1.030)
pH: 6 (ref 5.0–8.0)

## 2020-03-17 LAB — CBC WITH DIFFERENTIAL/PLATELET
Abs Immature Granulocytes: 0.04 10*3/uL (ref 0.00–0.07)
Basophils Absolute: 0.1 10*3/uL (ref 0.0–0.1)
Basophils Relative: 1 %
Eosinophils Absolute: 0.1 10*3/uL (ref 0.0–0.5)
Eosinophils Relative: 2 %
HCT: 38 % (ref 36.0–46.0)
Hemoglobin: 11.7 g/dL — ABNORMAL LOW (ref 12.0–15.0)
Immature Granulocytes: 1 %
Lymphocytes Relative: 12 %
Lymphs Abs: 1.1 10*3/uL (ref 0.7–4.0)
MCH: 26.8 pg (ref 26.0–34.0)
MCHC: 30.8 g/dL (ref 30.0–36.0)
MCV: 87 fL (ref 80.0–100.0)
Monocytes Absolute: 0.7 10*3/uL (ref 0.1–1.0)
Monocytes Relative: 8 %
Neutro Abs: 6.8 10*3/uL (ref 1.7–7.7)
Neutrophils Relative %: 76 %
Platelets: 176 10*3/uL (ref 150–400)
RBC: 4.37 MIL/uL (ref 3.87–5.11)
RDW: 14.4 % (ref 11.5–15.5)
WBC: 8.9 10*3/uL (ref 4.0–10.5)
nRBC: 0 % (ref 0.0–0.2)

## 2020-03-17 LAB — COMPREHENSIVE METABOLIC PANEL
ALT: 10 U/L (ref 0–44)
AST: 20 U/L (ref 15–41)
Albumin: 4.1 g/dL (ref 3.5–5.0)
Alkaline Phosphatase: 81 U/L (ref 38–126)
Anion gap: 11 (ref 5–15)
BUN: 17 mg/dL (ref 8–23)
CO2: 26 mmol/L (ref 22–32)
Calcium: 9.2 mg/dL (ref 8.9–10.3)
Chloride: 100 mmol/L (ref 98–111)
Creatinine, Ser: 1.02 mg/dL — ABNORMAL HIGH (ref 0.44–1.00)
GFR calc Af Amer: 55 mL/min — ABNORMAL LOW (ref >60)
GFR calc non Af Amer: 47 mL/min — ABNORMAL LOW (ref >60)
Glucose, Bld: 103 mg/dL — ABNORMAL HIGH (ref 70–99)
Potassium: 3.3 mmol/L — ABNORMAL LOW (ref 3.5–5.1)
Sodium: 137 mmol/L (ref 135–145)
Total Bilirubin: 1.8 mg/dL — ABNORMAL HIGH (ref 0.3–1.2)
Total Protein: 6.9 g/dL (ref 6.5–8.1)

## 2020-03-17 LAB — LACTIC ACID, PLASMA: Lactic Acid, Venous: 1.3 mmol/L (ref 0.5–1.9)

## 2020-03-17 LAB — PROCALCITONIN: Procalcitonin: <0.1 ng/mL

## 2020-03-17 LAB — APTT: aPTT: 30 seconds (ref 24–36)

## 2020-03-17 LAB — PROTIME-INR
INR: 1.1 (ref 0.8–1.2)
Prothrombin Time: 14 seconds (ref 11.4–15.2)

## 2020-03-17 LAB — LIPASE, BLOOD: Lipase: 18 U/L (ref 11–51)

## 2020-03-17 LAB — TSH: TSH: 0.513 u[IU]/mL (ref 0.350–4.500)

## 2020-03-17 LAB — FIBRIN DERIVATIVES D-DIMER (ARMC ONLY): Fibrin derivatives D-dimer (ARMC): 1709.86 ng/mL (FEU) — ABNORMAL HIGH (ref 0.00–499.00)

## 2020-03-17 LAB — SARS CORONAVIRUS 2 BY RT PCR (HOSPITAL ORDER, PERFORMED IN ~~LOC~~ HOSPITAL LAB): SARS Coronavirus 2: NEGATIVE

## 2020-03-17 LAB — GROUP A STREP BY PCR: Group A Strep by PCR: NOT DETECTED

## 2020-03-17 MED ORDER — POLYVINYL ALCOHOL 1.4 % OP SOLN
1.0000 [drp] | Freq: Every day | OPHTHALMIC | Status: DC | PRN
  Filled 2020-03-17: qty 15

## 2020-03-17 MED ORDER — ONDANSETRON HCL 4 MG/2ML IJ SOLN
4.0000 mg | Freq: Four times a day (QID) | INTRAMUSCULAR | Status: DC | PRN
  Administered 2020-03-22: 12:00:00 4 mg via INTRAVENOUS
  Filled 2020-03-17: qty 2

## 2020-03-17 MED ORDER — FLUTICASONE PROPIONATE 50 MCG/ACT NA SUSP
1.0000 | Freq: Every day | NASAL | Status: DC | PRN
  Filled 2020-03-17: qty 16

## 2020-03-17 MED ORDER — ONDANSETRON HCL 4 MG PO TABS: 4.0000 mg | ORAL_TABLET | Freq: Four times a day (QID) | ORAL | Status: DC | PRN

## 2020-03-17 MED ORDER — LEVOTHYROXINE SODIUM 50 MCG PO TABS
50.0000 ug | ORAL_TABLET | Freq: Every day | ORAL | Status: DC
  Administered 2020-03-18 – 2020-03-24 (×5): 50 ug via ORAL
  Filled 2020-03-17 (×7): qty 1

## 2020-03-17 MED ORDER — SODIUM CHLORIDE 0.9 % IV BOLUS
500.0000 mL | Freq: Once | INTRAVENOUS | Status: AC
  Administered 2020-03-17: 09:00:00 500 mL via INTRAVENOUS

## 2020-03-17 MED ORDER — PREDNISONE 10 MG PO TABS
5.0000 mg | ORAL_TABLET | Freq: Every day | ORAL | Status: DC
  Administered 2020-03-18 – 2020-03-24 (×5): 5 mg via ORAL
  Filled 2020-03-17 (×7): qty 1

## 2020-03-17 MED ORDER — METOPROLOL TARTRATE 25 MG PO TABS
25.0000 mg | ORAL_TABLET | Freq: Once | ORAL | Status: AC
  Administered 2020-03-17: 25 mg via ORAL
  Filled 2020-03-17: qty 1

## 2020-03-17 MED ORDER — TRAMADOL HCL 50 MG PO TABS
50.0000 mg | ORAL_TABLET | Freq: Four times a day (QID) | ORAL | Status: DC
  Administered 2020-03-17 – 2020-03-24 (×18): 50 mg via ORAL
  Filled 2020-03-17 (×21): qty 1

## 2020-03-17 MED ORDER — APIXABAN 2.5 MG PO TABS
2.5000 mg | ORAL_TABLET | Freq: Two times a day (BID) | ORAL | Status: DC
  Administered 2020-03-17 – 2020-03-24 (×11): 2.5 mg via ORAL
  Filled 2020-03-17 (×15): qty 1

## 2020-03-17 MED ORDER — DILTIAZEM HCL 25 MG/5ML IV SOLN
10.0000 mg | Freq: Once | INTRAVENOUS | Status: AC
  Administered 2020-03-17: 10 mg via INTRAVENOUS
  Filled 2020-03-17: qty 5

## 2020-03-17 MED ORDER — DILTIAZEM LOAD VIA INFUSION: 10.0000 mg | Freq: Once | INTRAVENOUS | Status: DC

## 2020-03-17 MED ORDER — SENNA 8.6 MG PO TABS
1.0000 | ORAL_TABLET | Freq: Two times a day (BID) | ORAL | Status: DC
  Administered 2020-03-17 – 2020-03-24 (×10): 8.6 mg via ORAL
  Filled 2020-03-17 (×12): qty 1

## 2020-03-17 MED ORDER — POLYETHYLENE GLYCOL 3350 17 G PO PACK
17.0000 g | PACK | Freq: Every day | ORAL | Status: DC | PRN
  Administered 2020-03-18: 17 g via ORAL
  Filled 2020-03-17: qty 1

## 2020-03-17 MED ORDER — ASPIRIN EC 81 MG PO TBEC
81.0000 mg | DELAYED_RELEASE_TABLET | Freq: Every day | ORAL | Status: DC
  Administered 2020-03-18 – 2020-03-19 (×2): 81 mg via ORAL
  Filled 2020-03-17 (×3): qty 1

## 2020-03-17 MED ORDER — IOHEXOL 350 MG/ML SOLN
75.0000 mL | Freq: Once | INTRAVENOUS | Status: AC | PRN
  Administered 2020-03-17: 75 mL via INTRAVENOUS

## 2020-03-17 MED ORDER — METOPROLOL TARTRATE 25 MG PO TABS
25.0000 mg | ORAL_TABLET | Freq: Two times a day (BID) | ORAL | Status: DC
  Administered 2020-03-17 – 2020-03-18 (×3): 25 mg via ORAL
  Filled 2020-03-17 (×3): qty 1

## 2020-03-17 MED ORDER — ACETAMINOPHEN 160 MG/5ML PO SOLN
650.0000 mg | Freq: Once | ORAL | Status: DC
  Filled 2020-03-17: qty 20.3

## 2020-03-17 MED ORDER — LORATADINE 10 MG PO TABS: 10.0000 mg | ORAL_TABLET | Freq: Every day | ORAL | Status: DC | PRN

## 2020-03-17 MED ORDER — SODIUM CHLORIDE 0.9 % IV SOLN: INTRAVENOUS | Status: DC

## 2020-03-17 MED ORDER — POTASSIUM CHLORIDE CRYS ER 20 MEQ PO TBCR
40.0000 meq | EXTENDED_RELEASE_TABLET | Freq: Once | ORAL | Status: AC
  Administered 2020-03-22: 11:00:00 40 meq via ORAL

## 2020-03-17 MED ORDER — POTASSIUM CHLORIDE CRYS ER 10 MEQ PO TBCR
10.0000 meq | EXTENDED_RELEASE_TABLET | Freq: Every day | ORAL | Status: DC
  Administered 2020-03-18 – 2020-03-23 (×4): 10 meq via ORAL
  Filled 2020-03-17 (×6): qty 1

## 2020-03-17 MED ORDER — IOPAMIDOL (ISOVUE-370) INJECTION 76%: 75.0000 mL | Freq: Once | INTRAVENOUS | Status: DC | PRN

## 2020-03-17 MED ORDER — VITAMIN B-12 1000 MCG PO TABS
1000.0000 ug | ORAL_TABLET | Freq: Every day | ORAL | Status: DC
  Administered 2020-03-18 – 2020-03-23 (×4): 1000 ug via ORAL
  Filled 2020-03-17 (×7): qty 1

## 2020-03-17 NOTE — ED Provider Notes (Signed)
Daughter at bedside.  Updated on tests that have returned at this time.  She does remain tachycardic, and appears sinus tachycardia with a rate of about 130 at this time.  She continues to feel fatigued endorses some dysuria.  Await urinalysis.  We will order her home Lopressor as well.  Thus far her lab work fairly reassuring without elevated white count.  However her heart rate elevated in the 130-140 range and slight tachypnea are concerning though she appears nontoxic at this time   Delman Kitten, MD 03/17/20 1027

## 2020-03-17 NOTE — H&P (Addendum)
History and Physical    [name] NLZ:[id number] DOB: 03-06-1923 DOA: 03/17/2020  PCP: Idelle Crouch, MD   Patient coming from: [facility] skilled nursing facility  I have personally briefly reviewed patient's old medical records in [place]  Chief Complaint: Lower abdominal pain   HPI: [name] is a 86 y.o. female with medical history significant for hypertension who was brought into the ER by EMS for evaluation of fever, tachycardia and back pain.  Patient was status of sustained a fall 2 days prior to her hospitalization and has complained of low back pain since then.  Per nursing home staff she had a fever with a T-max of 102F and was also tachycardic.  Patient complains of a sore throat with associated nausea but denies having any vomiting.  She also complains of left flank pain.  She denies having any chest pain, no shortness of breath, no dizziness or lightheadedness, no headache, no changes in her bowel habits Upon arrival to the ED patient was noted to be tachycardic with heart rates in the 140s she received a 500 cc fluid bolus without any significant change in her heart rate Her labs are within normal limits except for mild hypokalemia.  Her SARS coronavirus 2 test is negative. Urinalysis is sterile and chest x-ray reviewed by me shows cardiomegaly with no infiltrate or effusion. Patient had a CT scan of abdomen and pelvis as well as a CT angiogram which did not show any acute findings. Twelve-lead EKG reviewed by me shows sinus tachycardia  ED Course: Patient is a 86 year old female who was brought into the emergency room from skilled nursing facility for evaluation of fever and tachycardia.  Patient with complaints of sore throat but no overt source of sepsis at this time.  She will be referred to observation status for further evaluation.  Review of Systems: As per HPI otherwise 10 point review of systems negative.    Past Medical History:  Diagnosis  Date  . Arthritis   . Blood clot in vein   . CAD (coronary artery disease)   . Cancer (HCC)    squamous cell carcinoma  . COPD (chronic obstructive pulmonary disease) ([place])   . Diverticulitis   . DVT (deep vein thrombosis) in pregnancy   . GERD (gastroesophageal reflux disease)   . Myocardial infarction ([place])   . Pacemaker   . PE (pulmonary thromboembolism) ([place])   . Scoliosis   . Stroke ([place])   . Thyroid disease   . TIA (transient ischemic attack)     Past Surgical History:  Procedure Laterality Date  . APPENDECTOMY    . BREAST SURGERY    . EYE SURGERY    . PACEMAKER IMPLANT    . TONSILLECTOMY       reports that she has never smoked. She has never used smokeless tobacco. She reports that she does not drink alcohol and does not use drugs.  Allergies  Allergen Reactions  . Adhesive [Tape] Itching    Reaction: itching  . Clindamycin/Lincomycin Other (See Comments)    Reaction: upset stomach   . Codeine Other (See Comments)  . Demerol [Meperidine] Nausea Only  . Erythromycin Other (See Comments)  . Morphine And Related     Reaction: unknown   . Other Nausea Only    Darvocet  . Penicillins Rash    Has patient had a PCN reaction causing immediate rash, facial/tongue/throat swelling, SOB or lightheadedness with hypotension: unknown Has patient had a PCN  reaction causing severe rash involving mucus membranes or skin necrosis: unknown Has patient had a PCN reaction that required hospitalization: unknown  Has patient had a PCN reaction occurring within the last 10 years: unknown  If all of the above answers are "NO", then may proceed with Cephalosporin use.    . Sulfa Antibiotics Rash    Family History  Problem Relation Age of Onset  . Heart disease Mother   . Heart attack Mother      Prior to Admission medications   Medication Sig Start Date End Date Taking? Authorizing Provider  aspirin EC 81 MG tablet Take 81 mg by mouth daily.    [provider]    ELIQUIS 2.5 MG TABS tablet Take 2.5 mg by mouth 2 (two) times daily. 02/14/20   [provider]  fluticasone (FLONASE) 50 MCG/ACT nasal spray Place 1 spray into both nostrils daily as needed for allergies or rhinitis.    [provider]  furosemide (LASIX) 40 MG tablet Take 40 mg by mouth.    [provider]  levothyroxine (SYNTHROID, LEVOTHROID) 50 MCG tablet Take 50 mcg by mouth daily before breakfast. 30 minutes before breakfast    [provider]  loratadine (CLARITIN) 10 MG tablet Take 10 mg by mouth daily as needed for allergies.    [provider]  Menthol, Topical Analgesic, (ICY HOT ADVANCED RELIEF EX) Apply 1 application topically daily.    [provider]  metoprolol tartrate (LOPRESSOR) 25 MG tablet Take 25 mg by mouth 2 (two) times daily.    [provider]  Polyethyl Glycol-Propyl Glycol (SYSTANE ULTRA OP) Apply 1 drop to eye daily as needed (Dry eye).    [provider]  polyethylene glycol (MIRALAX / GLYCOLAX) packet Take 17 g by mouth daily as needed for mild constipation.     [provider]  potassium chloride (K-DUR,KLOR-CON) 10 MEQ tablet Take 10 mEq by mouth daily.    [provider]  predniSONE (DELTASONE) 5 MG tablet Take 5 mg by mouth daily with breakfast.    [provider]  senna (SENOKOT) 8.6 MG TABS tablet Take 1 tablet by mouth 2 (two) times daily.     [provider]  traMADol (ULTRAM) 50 MG tablet Take 50 mg by mouth 4 (four) times daily.     [provider]  vitamin B-12 (CYANOCOBALAMIN) 1000 MCG tablet Take 1,000 mcg by mouth daily.    [provider]    Physical Exam: Vitals:   03/17/20 1200 03/17/20 1230 03/17/20 1300 03/17/20 1330  BP: 108/67 110/87 128/86 (!) 100/57  Pulse: (!) 101 (!) 130 (!) 132 (!) 115  Resp: (!) 28 (!) 23 (!) 24   Temp:      TempSrc:      SpO2: 93% 94% 94% 93%  Weight:      Height:         Vitals:    03/17/20 1200 03/17/20 1230 03/17/20 1300 03/17/20 1330  BP: 108/67 110/87 128/86 (!) 100/57  Pulse: (!) 101 (!) 130 (!) 132 (!) 115  Resp: (!) 28 (!) 23 (!) 24   Temp:      TempSrc:      SpO2: 93% 94% 94% 93%  Weight:      Height:        Constitutional: NAD, alert and oriented x 2.  Chronically ill-appearing Eyes: PERRL, lids and conjunctivae pallor ENMT: Mucous membranes are dry.  Neck: normal, supple, no masses, no thyromegaly Respiratory:  Bilateral air entry, no wheezing, no crackles. Normal respiratory effort. No accessory muscle use.  Cardiovascular: Tachycardic, no murmurs / rubs / gallops. No extremity edema. 2+ pedal pulses. No carotid bruits.  Abdomen: no tenderness, no masses palpated. No hepatosplenomegaly. Bowel sounds positive.  Musculoskeletal: no clubbing / cyanosis. No joint deformity upper and lower extremities.  Skin: no rashes, lesions, ulcers.  Neurologic: No gross focal neurologic deficit. Psychiatric: Normal mood and affect.   Labs on Admission: I have personally reviewed following labs and imaging studies  CBC: Recent Labs  Lab 03/17/20 0923  WBC 8.9  NEUTROABS 6.8  HGB 11.7*  HCT 38.0  MCV 87.0  PLT 160   Basic Metabolic Panel: Recent Labs  Lab 03/17/20 0923  NA 137  K 3.3*  CL 100  CO2 26  GLUCOSE 103*  BUN 17  CREATININE 1.02*  CALCIUM 9.2   GFR: Estimated Creatinine Clearance: 27.9 mL/min (A) (by C-G formula based on SCr of 1.02 mg/dL (H)). Liver Function Tests: Recent Labs  Lab 03/17/20 0923  AST 20  ALT 10  ALKPHOS 81  BILITOT 1.8*  PROT 6.9  ALBUMIN 4.1   Recent Labs  Lab 03/17/20 0923  LIPASE 18   No results for input(s): AMMONIA in the last 168 hours. Coagulation Profile: Recent Labs  Lab 03/17/20 0923  INR 1.1   Cardiac Enzymes: No results for input(s): CKTOTAL, CKMB, CKMBINDEX, TROPONINI in the last 168 hours. BNP (last 3 results) No results for input(s): PROBNP in the last 8760 hours. HbA1C: No results  for input(s): HGBA1C in the last 72 hours. CBG: No results for input(s): GLUCAP in the last 168 hours. Lipid Profile: No results for input(s): CHOL, HDL, LDLCALC, TRIG, CHOLHDL, LDLDIRECT in the last 72 hours. Thyroid Function Tests: Recent Labs    03/17/20 0923  TSH 0.513   Anemia Panel: No results for input(s): VITAMINB12, FOLATE, FERRITIN, TIBC, IRON, RETICCTPCT in the last 72 hours. Urine analysis:    Component Value Date/Time   COLORURINE YELLOW (A) 03/17/2020 1129   APPEARANCEUR CLEAR (A) 03/17/2020 1129   APPEARANCEUR Clear 06/30/2012 1615   LABSPEC 1.021 03/17/2020 1129   LABSPEC 1.008 06/30/2012 1615   PHURINE 6.0 03/17/2020 1129   GLUCOSEU NEGATIVE 03/17/2020 1129   GLUCOSEU Negative 06/30/2012 1615   HGBUR SMALL (A) 03/17/2020 1129   BILIRUBINUR NEGATIVE 03/17/2020 1129   BILIRUBINUR Negative 06/30/2012 1615   KETONESUR 20 (A) 03/17/2020 1129   PROTEINUR NEGATIVE 03/17/2020 1129   NITRITE NEGATIVE 03/17/2020 1129   LEUKOCYTESUR SMALL (A) 03/17/2020 1129   LEUKOCYTESUR Negative 06/30/2012 1615    Radiological Exams on Admission: DG Chest 2 View  Result Date: 03/17/2020 CLINICAL DATA:  Fever EXAM: CHEST - 2 VIEW COMPARISON:  01/08/2018 FINDINGS: Left pacer remains in place, unchanged. Cardiomegaly. Lungs clear. No effusions or edema. No acute bony abnormality. Degenerative changes in the shoulders. IMPRESSION: Cardiomegaly.  No active disease. Electronically Signed   By: Rolm Baptise M.D.   On: 03/17/2020 08:38   DG Lumbar Spine 2-3 Views  Result Date: 03/17/2020 CLINICAL DATA:  Right-sided low back pain after fall 2 days ago. Fever EXAM: LUMBAR SPINE - 2-3 VIEW COMPARISON:  MRI 09/05/2007 FINDINGS: Five lumbar type vertebral segments. S shaped lumbar scoliotic curvature with asymmetric disc height loss compensatory to the curve. Bones are demineralized. Vertebral body heights appear maintained without evidence of fracture. Lower lumbar facet arthrosis. IMPRESSION:  1. No evidence of acute fracture or static listhesis. 2. Scoliosis and spondylosis of the lumbar  spine. Electronically Signed   By: Davina Poke D.O.   On: 03/17/2020 08:42   CT Angio Chest PE W and/or Wo Contrast  Result Date: 03/17/2020 CLINICAL DATA:  Fever of unknown origin EXAM: CT ANGIOGRAPHY CHEST CT ABDOMEN AND PELVIS WITH CONTRAST TECHNIQUE: Multidetector CT imaging of the chest was performed using the standard protocol during bolus administration of intravenous contrast. Multiplanar CT image reconstructions and MIPs were obtained to evaluate the vascular anatomy. Multidetector CT imaging of the abdomen and pelvis was performed using the standard protocol during bolus administration of intravenous contrast. CONTRAST:  13mL OMNIPAQUE IOHEXOL 350 MG/ML SOLN COMPARISON:  None. FINDINGS: CTA CHEST FINDINGS Cardiovascular: There is preferential opacification of the pulmonary arteries. No acute pulmonary embolism. Calcified plaque along the thoracic aorta. Marked enlargement of the right atrium. Left chest wall pacemaker is present. Mediastinum/Nodes: No enlarged lymph nodes identified. Lungs/Pleura: Mild interstitial thickening, which is chronic. Mild bibasilar atelectasis/scarring. Musculoskeletal: No acute osseous abnormality. Review of the MIP images confirms the above findings. CT ABDOMEN and PELVIS FINDINGS Hepatobiliary: No focal liver abnormality is seen. No gallstones, gallbladder wall thickening, or biliary dilatation. Pancreas: Unremarkable. Spleen: Too small to characterize hypoattenuating lesion. Otherwise unremarkable. Adrenals/Urinary Tract: Adrenals are unremarkable. Bilateral too small to characterize hypoattenuating renal lesions. Left renal scarring. Bladder is unremarkable. Stomach/Bowel: Stomach is unremarkable. Bowel is normal in caliber. There is distal colonic diverticulosis. Vascular/Lymphatic: Aortic atherosclerosis. There are no enlarged lymph nodes identified. Reproductive:  Status post hysterectomy. No adnexal masses. Other: No ascites. Musculoskeletal: Levoscoliosis of the lumbar spine. Multilevel degenerative changes of the included spine. No acute osseous abnormality. Review of the MIP images confirms the above findings. IMPRESSION: No acute abnormality including evidence of acute infectious process. Chronic findings detailed above. Electronically Signed   By: Macy Mis M.D.   On: 03/17/2020 14:37   CT ABDOMEN PELVIS W CONTRAST  Result Date: 03/17/2020 CLINICAL DATA:  Fever of unknown origin EXAM: CT ANGIOGRAPHY CHEST CT ABDOMEN AND PELVIS WITH CONTRAST TECHNIQUE: Multidetector CT imaging of the chest was performed using the standard protocol during bolus administration of intravenous contrast. Multiplanar CT image reconstructions and MIPs were obtained to evaluate the vascular anatomy. Multidetector CT imaging of the abdomen and pelvis was performed using the standard protocol during bolus administration of intravenous contrast. CONTRAST:  69mL OMNIPAQUE IOHEXOL 350 MG/ML SOLN COMPARISON:  None. FINDINGS: CTA CHEST FINDINGS Cardiovascular: There is preferential opacification of the pulmonary arteries. No acute pulmonary embolism. Calcified plaque along the thoracic aorta. Marked enlargement of the right atrium. Left chest wall pacemaker is present. Mediastinum/Nodes: No enlarged lymph nodes identified. Lungs/Pleura: Mild interstitial thickening, which is chronic. Mild bibasilar atelectasis/scarring. Musculoskeletal: No acute osseous abnormality. Review of the MIP images confirms the above findings. CT ABDOMEN and PELVIS FINDINGS Hepatobiliary: No focal liver abnormality is seen. No gallstones, gallbladder wall thickening, or biliary dilatation. Pancreas: Unremarkable. Spleen: Too small to characterize hypoattenuating lesion. Otherwise unremarkable. Adrenals/Urinary Tract: Adrenals are unremarkable. Bilateral too small to characterize hypoattenuating renal lesions. Left  renal scarring. Bladder is unremarkable. Stomach/Bowel: Stomach is unremarkable. Bowel is normal in caliber. There is distal colonic diverticulosis. Vascular/Lymphatic: Aortic atherosclerosis. There are no enlarged lymph nodes identified. Reproductive: Status post hysterectomy. No adnexal masses. Other: No ascites. Musculoskeletal: Levoscoliosis of the lumbar spine. Multilevel degenerative changes of the included spine. No acute osseous abnormality. Review of the MIP images confirms the above findings. IMPRESSION: No acute abnormality including evidence of acute infectious process. Chronic findings detailed above. Electronically Signed   By: Malachi Carl  Patel M.D.   On: 03/17/2020 14:37    EKG: Independently reviewed.  Sinus tachycardia  Assessment/Plan Principal Problem:   Pharyngitis with viral syndrome Active Problems:   CAD (coronary artery disease)   Hypothyroidism   GERD (gastroesophageal reflux disease)    Pharyngitis with viral syndrome Patient was sent from the nursing home for evaluation of fever with a T-max of 102, tachypnea and sinus tachycardia. Patient has no obvious signs of a bacterial infection at this time Follow-up results of blood cultures Supportive care   Hypothyroidism Continue Synthroid    Paroxysmal A. Fib with rapid ventricular rate Patient EKG showed sinus tachycardia but repeat EKG done due to persistent tachycardia showed rapid atrial fibrillation Continue metoprolol Will administer a dose of Cardizem 10 mg IV push and if tachycardia persists we will start patient on a Cardizem drip Continue Eliquis as primary prophylaxis for an acute stroke    Hypokalemia Secondary to diuretic use Supplement potassium   DVT prophylaxis: Eliquis Code Status: Full code Family Communication: Attempted to reach patient's daughter Vennie Homans over the phone.  Unable to leave a voicemail at this time. Disposition Plan: Back to previous home environment Consults called:  None    Ainsleigh Kakos MD [facility]     03/17/2020, 3:01 PM

## 2020-03-17 NOTE — ED Provider Notes (Signed)
Hospitalist, Dr. Reynolds Bowl, MD 03/17/20 1626

## 2020-03-17 NOTE — ED Triage Notes (Signed)
Pt to ER from [facility] via EMS with reports of fever, tachycardia, HTN and back pain.  Pt reports a fall 2 days ago with lower back pain since that time, was not evaluated at that time.  [facility] reports temp of 102, EMS states no fever with them.  Pt also reports lower abdominal pain and sore throat.

## 2020-03-17 NOTE — ED Provider Notes (Signed)
[facility] Emergency Department Provider Note   ____________________________________________   First MD Initiated Contact with Patient 03/17/20 [phone]     (approximate)  I have reviewed the triage vital signs and the nursing notes.   HISTORY  Chief Complaint Sore throat, fever fatigue   HPI [name] is a 86 y.o. female here for evaluation reports that since last night still very sore throat.  She been feeling like it is very dry.  Some nausea but no vomiting.  No trouble breathing or cough.  No chest pain.  Does report having some aching along her right flank.  Had a mild headache as well.  No neck pain.  Reports she fell between the bed and the wall 2 days ago did not strike her head or have any serious injury but a little sore along her flank.  Nausea no vomiting.  Decreased appetite since last night  EMS reports patient's heart rate about 102, normal temperature, and blood pressure mildly elevated but report facility had noted the patient to have a fever of 102  with a heart rate of 140 earlier today  Past Medical History:  Diagnosis Date  . Arthritis   . Blood clot in vein   . CAD (coronary artery disease)   . Cancer (HCC)    squamous cell carcinoma  . COPD (chronic obstructive pulmonary disease) ([place])   . Diverticulitis   . DVT (deep vein thrombosis) in pregnancy   . GERD (gastroesophageal reflux disease)   . Myocardial infarction ([place])   . Pacemaker   . PE (pulmonary thromboembolism) ([place])   . Scoliosis   . Stroke ([place])   . Thyroid disease   . TIA (transient ischemic attack)     Patient Active Problem List   Diagnosis Date Noted  . Pharyngitis with viral syndrome 03/17/2020  . Acute viral syndrome 03/17/2020  . AF (paroxysmal atrial fibrillation) ([place]) 03/17/2020  . Paroxysmal atrial fibrillation with rapid ventricular response ([place]) 03/17/2020  . Hallucinations 12/09/2017  . CAD (coronary artery disease) 12/09/2017  .  Hypothyroidism 12/09/2017  . COPD (chronic obstructive pulmonary disease) ([place]) 12/09/2017  . Acute renal failure superimposed on stage 3 chronic kidney disease ([place]) 12/09/2017  . GERD (gastroesophageal reflux disease) 12/09/2017  . Chest pain 04/25/2017  . Chest pain, pleuritic 09/08/2015    Past Surgical History:  Procedure Laterality Date  . APPENDECTOMY    . BREAST SURGERY    . EYE SURGERY    . PACEMAKER IMPLANT    . TONSILLECTOMY      Prior to Admission medications   Medication Sig Start Date End Date Taking? Authorizing Provider  ELIQUIS 2.5 MG TABS tablet Take 2.5 mg by mouth 2 (two) times daily. 02/14/20  Yes [provider]  furosemide (LASIX) 40 MG tablet Take 40 mg by mouth.   Yes [provider]  levothyroxine (SYNTHROID, LEVOTHROID) 50 MCG tablet Take 50 mcg by mouth daily before breakfast. 30 minutes before breakfast   Yes [provider]  loratadine (CLARITIN) 10 MG tablet Take 10 mg by mouth daily as needed for allergies.   Yes [provider]  metoprolol tartrate (LOPRESSOR) 25 MG tablet Take 25 mg by mouth 2 (two) times daily.   Yes [provider]  predniSONE (DELTASONE) 5 MG tablet Take 5 mg by mouth daily with breakfast.   Yes [provider]  senna (SENOKOT) 8.6 MG TABS tablet Take 1 tablet by mouth 2 (two) times daily.  Yes [provider]  traMADol (ULTRAM) 50 MG tablet Take 50 mg by mouth 4 (four) times daily.    Yes [provider]  vitamin B-12 (CYANOCOBALAMIN) 1000 MCG tablet Take 1,000 mcg by mouth daily.   Yes [provider]  aspirin EC 81 MG tablet Take 81 mg by mouth daily. Patient not taking: Reported on 03/17/2020    [provider]  fluticasone (FLONASE) 50 MCG/ACT nasal spray Place 1 spray into both nostrils daily as needed for allergies or rhinitis.    [provider]  Menthol, Topical Analgesic, (ICY HOT ADVANCED RELIEF EX) Apply 1 application  topically daily.    [provider]  Polyethyl Glycol-Propyl Glycol (SYSTANE ULTRA OP) Apply 1 drop to eye daily as needed (Dry eye).    [provider]  polyethylene glycol (MIRALAX / GLYCOLAX) packet Take 17 g by mouth daily as needed for mild constipation.     [provider]  potassium chloride (K-DUR,KLOR-CON) 10 MEQ tablet Take 10 mEq by mouth daily. Patient not taking: Reported on 03/17/2020    [provider]    Allergies Adhesive [tape], Clindamycin/lincomycin, Codeine, Demerol [meperidine], Erythromycin, Morphine and related, Other, Penicillins, and Sulfa antibiotics  Family History  Problem Relation Age of Onset  . Heart disease Mother   . Heart attack Mother     Social History Social History   Tobacco Use  . Smoking status: Never Smoker  . Smokeless tobacco: Never Used  Substance Use Topics  . Alcohol use: No  . Drug use: No    Review of Systems Constitutional: Fevers chills some fatigue eyes: No visual changes. ENT: Throat feels sore very dry.  Able to swallow but reports does feel extremely dry scratchy cardiovascular: Denies chest pain. Respiratory: Denies shortness of breath. Gastrointestinal: No abdominal pain.  No nausea. Genitourinary: Reports she is not urinating as much, some discomfort with urination  Musculoskeletal: Negative for back pain except for some aching along her right lower back for about 2 days. Skin: Negative for rash. Neurological: Negative for areas of focal weakness or numbness.    ____________________________________________   PHYSICAL EXAM:  VITAL SIGNS: ED Triage Vitals  Enc Vitals Group     BP      Pulse      Resp      Temp      Temp src      SpO2      Weight      Height      Head Circumference      Peak Flow      Pain Score      Pain Loc      Pain Edu?      Excl. in [place]?     Constitutional: Alert and oriented.  Mildly ill appearing and in no acute distress.  Appears fatigued in  general. Eyes: Conjunctivae are normal. Head: Atraumatic. Nose: No congestion/rhinnorhea. Mouth/Throat: Mucous membranes are very dry, slightly cracked lips and mucosa tongue.  Posterior oropharynx slightly injected but no masses edema or purulence noted. Neck: No stridor.  Cardiovascular: Very minimally tachycardic rate, irregular rhythm. Grossly normal heart sounds.  Good peripheral circulation. Respiratory: Normal respiratory effort.  No retractions. Lungs CTAB. Gastrointestinal: Soft and nontender except she reports some mild tenderness to palpation epigastrium without rebound or guarding.  No focal pain in the right upper quadrant.  No focal pain McBurney's point no distention. Musculoskeletal: No lower extremity tenderness nor edema.  Reports mild tenderness to the right costovertebral  angle to palpation and percussion.  No tenderness along the left.  No midline cervical thoracic or lumbar tenderness. Neurologic:  Normal speech and language. No gross focal neurologic deficits are appreciated.  Skin:  Skin is warm, dry and intact. No rash noted. Psychiatric: Mood and affect are normal. Speech and behavior are normal.  ____________________________________________   LABS (all labs ordered are listed, but only abnormal results are displayed)  Labs Reviewed  COMPREHENSIVE METABOLIC PANEL - Abnormal; Notable for the following components:      Result Value   Potassium 3.3 (*)    Glucose, Bld 103 (*)    Creatinine, Ser 1.02 (*)    Total Bilirubin 1.8 (*)    GFR calc non Af Amer 47 (*)    GFR calc Af Amer 55 (*)    All other components within normal limits  CBC WITH DIFFERENTIAL/PLATELET - Abnormal; Notable for the following components:   Hemoglobin 11.7 (*)    All other components within normal limits  URINALYSIS, ROUTINE W REFLEX MICROSCOPIC - Abnormal; Notable for the following components:   Color, Urine YELLOW (*)    APPearance CLEAR (*)    Hgb urine dipstick SMALL (*)     Ketones, ur 20 (*)    Leukocytes,Ua SMALL (*)    Bacteria, UA RARE (*)    All other components within normal limits  FIBRIN DERIVATIVES D-DIMER ([facility] ONLY) - Abnormal; Notable for the following components:   Fibrin derivatives D-dimer [facility]) 6,301.60 (*)    All other components within normal limits  SARS CORONAVIRUS 2 BY RT PCR (HOSPITAL ORDER, [place] [facility])  GROUP A STREP BY PCR  CULTURE, BLOOD (ROUTINE X 2)  CULTURE, BLOOD (ROUTINE X 2)  URINE CULTURE  LACTIC ACID, PLASMA  LIPASE, BLOOD  PROCALCITONIN  APTT  PROTIME-INR  TSH   ____________________________________________  EKG  Reviewed inter by me at 1030 Heart rate heart rate 140 QRS 70 QTc 390 Sinus tachycardia.  No evidence of acute ischemia.  Nonspecific T wave abnormality ____________________________________________  RADIOLOGY  DG Chest 2 View  Result Date: 03/17/2020 CLINICAL DATA:  Fever EXAM: CHEST - 2 VIEW COMPARISON:  01/08/2018 FINDINGS: Left pacer remains in place, unchanged. Cardiomegaly. Lungs clear. No effusions or edema. No acute bony abnormality. Degenerative changes in the shoulders. IMPRESSION: Cardiomegaly.  No active disease. Electronically Signed   By: Rolm Baptise M.D.   On: 03/17/2020 08:38   DG Lumbar Spine 2-3 Views  Result Date: 03/17/2020 CLINICAL DATA:  Right-sided low back pain after fall 2 days ago. Fever EXAM: LUMBAR SPINE - 2-3 VIEW COMPARISON:  MRI 09/05/2007 FINDINGS: Five lumbar type vertebral segments. S shaped lumbar scoliotic curvature with asymmetric disc height loss compensatory to the curve. Bones are demineralized. Vertebral body heights appear maintained without evidence of fracture. Lower lumbar facet arthrosis. IMPRESSION: 1. No evidence of acute fracture or static listhesis. 2. Scoliosis and spondylosis of the lumbar spine. Electronically Signed   By: Davina Poke D.O.   On: 03/17/2020 08:42   CT Angio Chest PE W and/or Wo Contrast  Result Date:  03/17/2020 CLINICAL DATA:  Fever of unknown origin EXAM: CT ANGIOGRAPHY CHEST CT ABDOMEN AND PELVIS WITH CONTRAST TECHNIQUE: Multidetector CT imaging of the chest was performed using the standard protocol during bolus administration of intravenous contrast. Multiplanar CT image reconstructions and MIPs were obtained to evaluate the vascular anatomy. Multidetector CT imaging of the abdomen and pelvis was performed using the standard protocol during bolus administration of  intravenous contrast. CONTRAST:  63mL OMNIPAQUE IOHEXOL 350 MG/ML SOLN COMPARISON:  None. FINDINGS: CTA CHEST FINDINGS Cardiovascular: There is preferential opacification of the pulmonary arteries. No acute pulmonary embolism. Calcified plaque along the thoracic aorta. Marked enlargement of the right atrium. Left chest wall pacemaker is present. Mediastinum/Nodes: No enlarged lymph nodes identified. Lungs/Pleura: Mild interstitial thickening, which is chronic. Mild bibasilar atelectasis/scarring. Musculoskeletal: No acute osseous abnormality. Review of the MIP images confirms the above findings. CT ABDOMEN and PELVIS FINDINGS Hepatobiliary: No focal liver abnormality is seen. No gallstones, gallbladder wall thickening, or biliary dilatation. Pancreas: Unremarkable. Spleen: Too small to characterize hypoattenuating lesion. Otherwise unremarkable. Adrenals/Urinary Tract: Adrenals are unremarkable. Bilateral too small to characterize hypoattenuating renal lesions. Left renal scarring. Bladder is unremarkable. Stomach/Bowel: Stomach is unremarkable. Bowel is normal in caliber. There is distal colonic diverticulosis. Vascular/Lymphatic: Aortic atherosclerosis. There are no enlarged lymph nodes identified. Reproductive: Status post hysterectomy. No adnexal masses. Other: No ascites. Musculoskeletal: Levoscoliosis of the lumbar spine. Multilevel degenerative changes of the included spine. No acute osseous abnormality. Review of the MIP images confirms the  above findings. IMPRESSION: No acute abnormality including evidence of acute infectious process. Chronic findings detailed above. Electronically Signed   By: Macy Mis M.D.   On: 03/17/2020 14:37   CT ABDOMEN PELVIS W CONTRAST  Result Date: 03/17/2020 CLINICAL DATA:  Fever of unknown origin EXAM: CT ANGIOGRAPHY CHEST CT ABDOMEN AND PELVIS WITH CONTRAST TECHNIQUE: Multidetector CT imaging of the chest was performed using the standard protocol during bolus administration of intravenous contrast. Multiplanar CT image reconstructions and MIPs were obtained to evaluate the vascular anatomy. Multidetector CT imaging of the abdomen and pelvis was performed using the standard protocol during bolus administration of intravenous contrast. CONTRAST:  80mL OMNIPAQUE IOHEXOL 350 MG/ML SOLN COMPARISON:  None. FINDINGS: CTA CHEST FINDINGS Cardiovascular: There is preferential opacification of the pulmonary arteries. No acute pulmonary embolism. Calcified plaque along the thoracic aorta. Marked enlargement of the right atrium. Left chest wall pacemaker is present. Mediastinum/Nodes: No enlarged lymph nodes identified. Lungs/Pleura: Mild interstitial thickening, which is chronic. Mild bibasilar atelectasis/scarring. Musculoskeletal: No acute osseous abnormality. Review of the MIP images confirms the above findings. CT ABDOMEN and PELVIS FINDINGS Hepatobiliary: No focal liver abnormality is seen. No gallstones, gallbladder wall thickening, or biliary dilatation. Pancreas: Unremarkable. Spleen: Too small to characterize hypoattenuating lesion. Otherwise unremarkable. Adrenals/Urinary Tract: Adrenals are unremarkable. Bilateral too small to characterize hypoattenuating renal lesions. Left renal scarring. Bladder is unremarkable. Stomach/Bowel: Stomach is unremarkable. Bowel is normal in caliber. There is distal colonic diverticulosis. Vascular/Lymphatic: Aortic atherosclerosis. There are no enlarged lymph nodes identified.  Reproductive: Status post hysterectomy. No adnexal masses. Other: No ascites. Musculoskeletal: Levoscoliosis of the lumbar spine. Multilevel degenerative changes of the included spine. No acute osseous abnormality. Review of the MIP images confirms the above findings. IMPRESSION: No acute abnormality including evidence of acute infectious process. Chronic findings detailed above. Electronically Signed   By: Macy Mis M.D.   On: 03/17/2020 14:37    Chest x-ray and CT imaging reviewed negative for acute infectious process.  Multiple incidental findings ____________________________________________   PROCEDURES  Procedure(s) performed: None  Procedures  Critical Care performed: No  ____________________________________________   INITIAL IMPRESSION / ASSESSMENT AND PLAN / ED COURSE  Pertinent labs & imaging results that were available during my care of the patient were reviewed by me and considered in my medical decision making (see chart for details).   Patient reports generalized fatigue, sore dry throat, reportedly fever to  102 at nursing facility, afebrile with EMS and low-grade here.  Reports generalized fatigue.  Some tenderness and right CVA tenderness discomfort and dysuria.  Suspicious for possible UTI and dehydration.  Reassuring oropharyngeal exam except does appear quite dry slightly injected.  No acute cardiac or pulmonary symptoms.  No acute neurologic or vascular symptoms.  Broad work-up for etiology of reported fever earlier today at the nursing facility.    Discussed with patient and her family, given her persistent tachycardia, patient will be admitted for further evaluation.  Also noted to have low-grade temperature 100.1 F on my check.  I suspect likely viral illness given no clear source, possibly viral pharyngitis however given the patient's age and meeting SIRS criteria I feel admission is warranted for further work-up and c admission discussed with Dr.  Francine Graven.  ____________________________________________   FINAL CLINICAL IMPRESSION(S) / ED DIAGNOSES  Final diagnoses:  SIRS (systemic inflammatory response syndrome) ([place])  Pharyngitis, unspecified etiology        Note:  This document was prepared using Dragon voice recognition software and may include unintentional dictation errors       Delman Kitten, MD 03/17/20 1628

## 2020-03-18 ENCOUNTER — Encounter: Payer: Self-pay | Admitting: Internal Medicine

## 2020-03-18 DIAGNOSIS — E039 Hypothyroidism, unspecified: Secondary | ICD-10-CM

## 2020-03-18 DIAGNOSIS — J029 Acute pharyngitis, unspecified: Secondary | ICD-10-CM | POA: Diagnosis not present

## 2020-03-18 DIAGNOSIS — I480 Paroxysmal atrial fibrillation: Secondary | ICD-10-CM | POA: Diagnosis not present

## 2020-03-18 DIAGNOSIS — R6510 Systemic inflammatory response syndrome (SIRS) of non-infectious origin without acute organ dysfunction: Secondary | ICD-10-CM | POA: Diagnosis not present

## 2020-03-18 DIAGNOSIS — B349 Viral infection, unspecified: Secondary | ICD-10-CM | POA: Diagnosis not present

## 2020-03-18 LAB — CBC
HCT: 32 % — ABNORMAL LOW (ref 36.0–46.0)
Hemoglobin: 9.8 g/dL — ABNORMAL LOW (ref 12.0–15.0)
MCH: 27 pg (ref 26.0–34.0)
MCHC: 30.6 g/dL (ref 30.0–36.0)
MCV: 88.2 fL (ref 80.0–100.0)
Platelets: 118 10*3/uL — ABNORMAL LOW (ref 150–400)
RBC: 3.63 MIL/uL — ABNORMAL LOW (ref 3.87–5.11)
RDW: 14.7 % (ref 11.5–15.5)
WBC: 8 10*3/uL (ref 4.0–10.5)
nRBC: 0 % (ref 0.0–0.2)

## 2020-03-18 LAB — BASIC METABOLIC PANEL
Anion gap: 7 (ref 5–15)
BUN: 13 mg/dL (ref 8–23)
CO2: 23 mmol/L (ref 22–32)
Calcium: 8.1 mg/dL — ABNORMAL LOW (ref 8.9–10.3)
Chloride: 105 mmol/L (ref 98–111)
Creatinine, Ser: 0.75 mg/dL (ref 0.44–1.00)
GFR calc Af Amer: >60 mL/min (ref >60)
GFR calc non Af Amer: >60 mL/min (ref >60)
Glucose, Bld: 91 mg/dL (ref 70–99)
Potassium: 3.3 mmol/L — ABNORMAL LOW (ref 3.5–5.1)
Sodium: 135 mmol/L (ref 135–145)

## 2020-03-18 LAB — MRSA PCR SCREENING: MRSA by PCR: POSITIVE — AB

## 2020-03-18 MED ORDER — CEPASTAT 14.5 MG MT LOZG
1.0000 | LOZENGE | OROMUCOSAL | Status: DC | PRN
  Filled 2020-03-18: qty 9

## 2020-03-18 MED ORDER — MUPIROCIN 2 % EX OINT
1.0000 "application " | TOPICAL_OINTMENT | Freq: Two times a day (BID) | CUTANEOUS | Status: AC
  Administered 2020-03-18 – 2020-03-23 (×5): 1 via NASAL
  Filled 2020-03-18: qty 22

## 2020-03-18 MED ORDER — CHLORHEXIDINE GLUCONATE CLOTH 2 % EX PADS
6.0000 | MEDICATED_PAD | Freq: Every day | CUTANEOUS | Status: AC
  Administered 2020-03-19 – 2020-03-23 (×4): 6 via TOPICAL

## 2020-03-18 NOTE — ED Notes (Signed)
Updated 1C r/t pt's delay to their unit

## 2020-03-18 NOTE — Progress Notes (Signed)
[facility] NAME: [name]    MR#:  [id number]  DATE OF BIRTH:  Jan 04, 1923  SUBJECTIVE:   Most history is obtained from patient's daughter was present in the ER room. Patient apparently got moved into our long-term facility at [facility] about a week ago. She was brought in with fever and elevated heart rate  patient very sleepy although woken up and drink some water. She continues to have scratches/sore throat. Not able to give me much history review of systems. Appears weak and tired   Low-grade fever of 99 in the ER heart rate 99 to 120 REVIEW OF SYSTEMS:   Review of Systems  Constitutional: Positive for malaise/fatigue.  HENT: Positive for sore throat.   Neurological: Positive for weakness.   Tolerating Diet:little Tolerating PT: pending  DRUG ALLERGIES:   Allergies  Allergen Reactions  . Adhesive [Tape] Itching    Reaction: itching  . Clindamycin/Lincomycin Other (See Comments)    Reaction: upset stomach   . Codeine Other (See Comments)  . Demerol [Meperidine] Nausea Only  . Erythromycin Other (See Comments)  . Morphine And Related     Reaction: unknown   . Other Nausea Only    Darvocet  . Penicillins Rash    Has patient had a PCN reaction causing immediate rash, facial/tongue/throat swelling, SOB or lightheadedness with hypotension: unknown Has patient had a PCN reaction causing severe rash involving mucus membranes or skin necrosis: unknown Has patient had a PCN reaction that required hospitalization: unknown  Has patient had a PCN reaction occurring within the last 10 years: unknown  If all of the above answers are "NO", then may proceed with Cephalosporin use.    . Sulfa Antibiotics Rash    VITALS:  Blood pressure 111/75, pulse (!) 107, temperature 99.1 F (37.3 C), temperature source Oral, resp. rate 20, height 5\' 3"  (1.6 m), weight 61.7 kg, SpO2 93 %.  PHYSICAL EXAMINATION:   Physical  Exam  GENERAL:  86 y.o.-year-old patient lying in the bed with no acute distress. weak   HEENT: Head atraumatic, normocephalic. Oropharynx and nasopharynx clear.  NECK:  Supple, no jugular venous distention. No thyroid enlargement, no tenderness.  LUNGS: Normal breath sounds bilaterally, no wheezing, rales, rhonchi. No use of accessory muscles of respiration.  CARDIOVASCULAR: S1, S2 normal. No murmurs, rubs, or gallops. Tachycardia ABDOMEN: Soft, nontender, nondistended. Bowel sounds present. No organomegaly or mass.  EXTREMITIES: No cyanosis, clubbing or edema b/l.    NEUROLOGIC: unable to do detail exam. Patient moves all extremities spontaneously. No focal weakness  PSYCHIATRIC:  patient is sleepy but answered some quesitons SKIN: No obvious rash, lesion, or ulcer.   LABORATORY PANEL:  CBC Recent Labs  Lab 03/18/20 0649  WBC 8.0  HGB 9.8*  HCT 32.0*  PLT 118*    Chemistries  Recent Labs  Lab 03/17/20 0923 03/17/20 0923 03/18/20 0649  NA 137   < > 135  K 3.3*   < > 3.3*  CL 100   < > 105  CO2 26   < > 23  GLUCOSE 103*   < > 91  BUN 17   < > 13  CREATININE 1.02*   < > 0.75  CALCIUM 9.2   < > 8.1*  AST 20  --   --   ALT 10  --   --   ALKPHOS 81  --   --   BILITOT 1.8*  --   --    < > =  values in this interval not displayed.   Cardiac Enzymes No results for input(s): TROPONINI in the last 168 hours. RADIOLOGY:  DG Chest 2 View  Result Date: 03/17/2020 CLINICAL DATA:  Fever EXAM: CHEST - 2 VIEW COMPARISON:  01/08/2018 FINDINGS: Left pacer remains in place, unchanged. Cardiomegaly. Lungs clear. No effusions or edema. No acute bony abnormality. Degenerative changes in the shoulders. IMPRESSION: Cardiomegaly.  No active disease. Electronically Signed   By: Rolm Baptise M.D.   On: 03/17/2020 08:38   DG Lumbar Spine 2-3 Views  Result Date: 03/17/2020 CLINICAL DATA:  Right-sided low back pain after fall 2 days ago. Fever EXAM: LUMBAR SPINE - 2-3 VIEW COMPARISON:  MRI  09/05/2007 FINDINGS: Five lumbar type vertebral segments. S shaped lumbar scoliotic curvature with asymmetric disc height loss compensatory to the curve. Bones are demineralized. Vertebral body heights appear maintained without evidence of fracture. Lower lumbar facet arthrosis. IMPRESSION: 1. No evidence of acute fracture or static listhesis. 2. Scoliosis and spondylosis of the lumbar spine. Electronically Signed   By: Davina Poke D.O.   On: 03/17/2020 08:42   CT Angio Chest PE W and/or Wo Contrast  Result Date: 03/17/2020 CLINICAL DATA:  Fever of unknown origin EXAM: CT ANGIOGRAPHY CHEST CT ABDOMEN AND PELVIS WITH CONTRAST TECHNIQUE: Multidetector CT imaging of the chest was performed using the standard protocol during bolus administration of intravenous contrast. Multiplanar CT image reconstructions and MIPs were obtained to evaluate the vascular anatomy. Multidetector CT imaging of the abdomen and pelvis was performed using the standard protocol during bolus administration of intravenous contrast. CONTRAST:  50mL OMNIPAQUE IOHEXOL 350 MG/ML SOLN COMPARISON:  None. FINDINGS: CTA CHEST FINDINGS Cardiovascular: There is preferential opacification of the pulmonary arteries. No acute pulmonary embolism. Calcified plaque along the thoracic aorta. Marked enlargement of the right atrium. Left chest wall pacemaker is present. Mediastinum/Nodes: No enlarged lymph nodes identified. Lungs/Pleura: Mild interstitial thickening, which is chronic. Mild bibasilar atelectasis/scarring. Musculoskeletal: No acute osseous abnormality. Review of the MIP images confirms the above findings. CT ABDOMEN and PELVIS FINDINGS Hepatobiliary: No focal liver abnormality is seen. No gallstones, gallbladder wall thickening, or biliary dilatation. Pancreas: Unremarkable. Spleen: Too small to characterize hypoattenuating lesion. Otherwise unremarkable. Adrenals/Urinary Tract: Adrenals are unremarkable. Bilateral too small to characterize  hypoattenuating renal lesions. Left renal scarring. Bladder is unremarkable. Stomach/Bowel: Stomach is unremarkable. Bowel is normal in caliber. There is distal colonic diverticulosis. Vascular/Lymphatic: Aortic atherosclerosis. There are no enlarged lymph nodes identified. Reproductive: Status post hysterectomy. No adnexal masses. Other: No ascites. Musculoskeletal: Levoscoliosis of the lumbar spine. Multilevel degenerative changes of the included spine. No acute osseous abnormality. Review of the MIP images confirms the above findings. IMPRESSION: No acute abnormality including evidence of acute infectious process. Chronic findings detailed above. Electronically Signed   By: Macy Mis M.D.   On: 03/17/2020 14:37   CT ABDOMEN PELVIS W CONTRAST  Result Date: 03/17/2020 CLINICAL DATA:  Fever of unknown origin EXAM: CT ANGIOGRAPHY CHEST CT ABDOMEN AND PELVIS WITH CONTRAST TECHNIQUE: Multidetector CT imaging of the chest was performed using the standard protocol during bolus administration of intravenous contrast. Multiplanar CT image reconstructions and MIPs were obtained to evaluate the vascular anatomy. Multidetector CT imaging of the abdomen and pelvis was performed using the standard protocol during bolus administration of intravenous contrast. CONTRAST:  89mL OMNIPAQUE IOHEXOL 350 MG/ML SOLN COMPARISON:  None. FINDINGS: CTA CHEST FINDINGS Cardiovascular: There is preferential opacification of the pulmonary arteries. No acute pulmonary embolism. Calcified plaque along the thoracic aorta.  Marked enlargement of the right atrium. Left chest wall pacemaker is present. Mediastinum/Nodes: No enlarged lymph nodes identified. Lungs/Pleura: Mild interstitial thickening, which is chronic. Mild bibasilar atelectasis/scarring. Musculoskeletal: No acute osseous abnormality. Review of the MIP images confirms the above findings. CT ABDOMEN and PELVIS FINDINGS Hepatobiliary: No focal liver abnormality is seen. No  gallstones, gallbladder wall thickening, or biliary dilatation. Pancreas: Unremarkable. Spleen: Too small to characterize hypoattenuating lesion. Otherwise unremarkable. Adrenals/Urinary Tract: Adrenals are unremarkable. Bilateral too small to characterize hypoattenuating renal lesions. Left renal scarring. Bladder is unremarkable. Stomach/Bowel: Stomach is unremarkable. Bowel is normal in caliber. There is distal colonic diverticulosis. Vascular/Lymphatic: Aortic atherosclerosis. There are no enlarged lymph nodes identified. Reproductive: Status post hysterectomy. No adnexal masses. Other: No ascites. Musculoskeletal: Levoscoliosis of the lumbar spine. Multilevel degenerative changes of the included spine. No acute osseous abnormality. Review of the MIP images confirms the above findings. IMPRESSION: No acute abnormality including evidence of acute infectious process. Chronic findings detailed above. Electronically Signed   By: Macy Mis M.D.   On: 03/17/2020 14:37   ASSESSMENT AND PLAN:   MIYO AINA is a 86 y.o. female with medical history significant for hypertension who was brought into the ER by EMS for evaluation of fever, tachycardia and back pain.  Patient was status of sustained a fall 2 days prior to her hospitalization and has complained of low back pain since then.  Per nursing home staff she had a fever with a T-max of 102F and was also tachycardic.  Patient complains of a sore throat with associated nausea but denies having any vomiting.   SIRS suspected due to Pharyngitis with viral syndrome -Patient was sent from the nursing home for evaluation of fever with a T-max of 102, tachypnea and sinus tachycardia. -low grade fever of 99 -weak -Patient has no obvious signs of a bacterial infection at this time Follow-up results of blood cultures negative -UC negative -CT chest abdomen pelvis -- no acute infectious etiology found  -strep throat negative -COVID negative -continue  supportive care PRN Tylenol on IV fluids  Hypothyroidism Continue Synthroid  acute renal failure likely due to poor PO intake -came in with creatinine of 1.02--- IV fluids-- .75  Persistent A. Fib with rapid ventricular rate and h/o SSS -Patient EKG showed sinus tachycardia but repeat EKG done due to persistent tachycardia showed rapid atrial fibrillation -Continue metoprolol --Continue Eliquis as primary prophylaxis for an acute stroke  -consider Cardiology consult if HR remains on the higher side  Hypokalemia Secondary to diuretic use Supplement potassium   DVT prophylaxis: Eliquis Code Status: DNR at admission Family Communication: patient's daughter [name] in the ER Disposition Plan: Back to previous home environment--[facility] Consults called: None  Status is: Inpatient  Remains inpatient appropriate because:Ongoing diagnostic testing needed not appropriate for outpatient work up   Dispo: The patient is from: SNF              Anticipated d/c is to: SNF              Anticipated d/c date is: 2 days              Patient currently is not medically stable to d/c. Fever w/u PT consult  TOTAL TIME TAKING CARE OF THIS PATIENT: 25 minutes.  >50% time spent on counselling and coordination of care  Note: This dictation was prepared with Dragon dictation along with smaller phrase technology. Any transcriptional errors that result from this process are unintentional.  Rhen Dossantos  Posey Pronto M.D    [facility]   CC: Primary care physician; Idelle Crouch, MDPatient ID: [name], female   DOB: 1923/04/05, 86 y.o.   MRN: [id number]

## 2020-03-18 NOTE — ED Notes (Signed)
Pt ambulatory to the bedside commode with walker and 2 person assistance. Pt had a small BM and urinated.

## 2020-03-18 NOTE — ED Notes (Signed)
Pt placed on bedpan, unable to use the bedpan for BM at this time. Pt repositioned in the bed.

## 2020-03-19 ENCOUNTER — Inpatient Hospital Stay: Payer: Medicare Other

## 2020-03-19 DIAGNOSIS — I480 Paroxysmal atrial fibrillation: Secondary | ICD-10-CM

## 2020-03-19 DIAGNOSIS — N390 Urinary tract infection, site not specified: Secondary | ICD-10-CM

## 2020-03-19 DIAGNOSIS — R6510 Systemic inflammatory response syndrome (SIRS) of non-infectious origin without acute organ dysfunction: Secondary | ICD-10-CM | POA: Diagnosis not present

## 2020-03-19 DIAGNOSIS — G9340 Encephalopathy, unspecified: Secondary | ICD-10-CM

## 2020-03-19 DIAGNOSIS — B9562 Methicillin resistant Staphylococcus aureus infection as the cause of diseases classified elsewhere: Secondary | ICD-10-CM

## 2020-03-19 DIAGNOSIS — J029 Acute pharyngitis, unspecified: Secondary | ICD-10-CM | POA: Diagnosis not present

## 2020-03-19 DIAGNOSIS — B349 Viral infection, unspecified: Secondary | ICD-10-CM | POA: Diagnosis not present

## 2020-03-19 DIAGNOSIS — R509 Fever, unspecified: Secondary | ICD-10-CM

## 2020-03-19 DIAGNOSIS — Z950 Presence of cardiac pacemaker: Secondary | ICD-10-CM

## 2020-03-19 LAB — CK: Total CK: 97 U/L (ref 38–234)

## 2020-03-19 LAB — URINE CULTURE: Culture: >=100000 — AB

## 2020-03-19 MED ORDER — POTASSIUM CHLORIDE CRYS ER 20 MEQ PO TBCR
40.0000 meq | EXTENDED_RELEASE_TABLET | Freq: Once | ORAL | Status: AC
  Administered 2020-03-20: 40 meq via ORAL
  Filled 2020-03-19: qty 2

## 2020-03-19 MED ORDER — POLYVINYL ALCOHOL 1.4 % OP SOLN
1.0000 [drp] | Freq: Three times a day (TID) | OPHTHALMIC | Status: DC
  Administered 2020-03-19 – 2020-03-23 (×8): 1 [drp] via OPHTHALMIC
  Filled 2020-03-19: qty 15

## 2020-03-19 MED ORDER — MAGIC MOUTHWASH W/LIDOCAINE: 5.0000 mL | Freq: Three times a day (TID) | ORAL | Status: DC

## 2020-03-19 MED ORDER — LIDOCAINE VISCOUS HCL 2 % MT SOLN
5.0000 mL | Freq: Three times a day (TID) | OROMUCOSAL | Status: DC
  Administered 2020-03-19 – 2020-03-23 (×8): 5 mL via OROMUCOSAL
  Filled 2020-03-19 (×18): qty 15

## 2020-03-19 MED ORDER — MELATONIN 5 MG PO TABS
5.0000 mg | ORAL_TABLET | Freq: Every day | ORAL | Status: DC
  Administered 2020-03-19 – 2020-03-23 (×4): 5 mg via ORAL
  Filled 2020-03-19 (×6): qty 1

## 2020-03-19 MED ORDER — MAGIC MOUTHWASH
5.0000 mL | Freq: Three times a day (TID) | ORAL | Status: DC
  Administered 2020-03-19 – 2020-03-23 (×11): 5 mL via ORAL
  Filled 2020-03-19 (×11): qty 5

## 2020-03-19 MED ORDER — METOPROLOL TARTRATE 25 MG PO TABS
37.5000 mg | ORAL_TABLET | Freq: Two times a day (BID) | ORAL | Status: DC
  Administered 2020-03-19 – 2020-03-21 (×3): 37.5 mg via ORAL
  Filled 2020-03-19 (×5): qty 2

## 2020-03-19 MED ORDER — IPRATROPIUM-ALBUTEROL 0.5-2.5 (3) MG/3ML IN SOLN: 3.0000 mL | Freq: Four times a day (QID) | RESPIRATORY_TRACT | Status: DC | PRN

## 2020-03-19 MED ORDER — FUROSEMIDE 10 MG/ML IJ SOLN
40.0000 mg | Freq: Once | INTRAMUSCULAR | Status: AC
  Administered 2020-03-20: 01:00:00 40 mg via INTRAVENOUS
  Filled 2020-03-19: qty 4

## 2020-03-19 NOTE — Consult Note (Signed)
CARDIOLOGY CONSULT NOTE               Patient ID: [name] MRN: [id number] DOB/AGE: [birth year] 86 y.o.  Admit date: 03/17/2020 Referring Physician Fritzi Mandes MD hospitalist Primary Physician Dr. Doy Hutching primary Primary Cardiologist Nehemiah Massed MD Reason for Consultation borderline troponins elevated atrial fibrillation rapid ventricular response  HPI: Patient presented with febrile illness from [place] she has had a cough congestion temperature 102 she has had a known history of sick sinus syndrome with a permanent pacemaker in place was brought in tachycardic rapid atrial fibrillation treated for pharyngitis and possible sepsis with broad-spectrum antibiotic therapy she was found to have  MRSA colonization.  Denies any significant chest pain no blackout spells or syncope she just feels sick and is felt poorly for the last several days cardiology was called because of significant tachycardic rate  Review of systems complete and found to be negative unless listed above     Past Medical History:  Diagnosis Date  . Arthritis   . Blood clot in vein   . CAD (coronary artery disease)   . Cancer (HCC)    squamous cell carcinoma  . COPD (chronic obstructive pulmonary disease) ([place])   . Diverticulitis   . DVT (deep vein thrombosis) in pregnancy   . GERD (gastroesophageal reflux disease)   . Myocardial infarction ([place])   . Pacemaker   . PE (pulmonary thromboembolism) ([place])   . Scoliosis   . Stroke ([place])   . Thyroid disease   . TIA (transient ischemic attack)     Past Surgical History:  Procedure Laterality Date  . APPENDECTOMY    . BREAST SURGERY    . EYE SURGERY    . PACEMAKER IMPLANT    . TONSILLECTOMY      Medications Prior to Admission  Medication Sig Dispense Refill Last Dose  . ELIQUIS 2.5 MG TABS tablet Take 2.5 mg by mouth 2 (two) times daily.   03/17/2020 at 0800  . furosemide (LASIX) 40 MG tablet Take 40 mg by mouth.   03/17/2020 at 0800  . levothyroxine  (SYNTHROID, LEVOTHROID) 50 MCG tablet Take 50 mcg by mouth daily before breakfast. 30 minutes before breakfast   03/17/2020 at 0630  . loratadine (CLARITIN) 10 MG tablet Take 10 mg by mouth daily as needed for allergies.   03/17/2020 at 0800  . metoprolol tartrate (LOPRESSOR) 25 MG tablet Take 25 mg by mouth 2 (two) times daily.   03/17/2020 at 0800  . predniSONE (DELTASONE) 5 MG tablet Take 5 mg by mouth daily with breakfast.   03/17/2020 at 0800  . senna (SENOKOT) 8.6 MG TABS tablet Take 1 tablet by mouth 2 (two) times daily.    03/17/2020 at 0800  . traMADol (ULTRAM) 50 MG tablet Take 50 mg by mouth 4 (four) times daily.    03/17/2020 at 0800  . vitamin B-12 (CYANOCOBALAMIN) 1000 MCG tablet Take 1,000 mcg by mouth daily.   03/17/2020 at 0800  . aspirin EC 81 MG tablet Take 81 mg by mouth daily. (Patient not taking: Reported on 03/17/2020)   Not Taking at Unknown time  . fluticasone (FLONASE) 50 MCG/ACT nasal spray Place 1 spray into both nostrils daily as needed for allergies or rhinitis.   prn at prn  . Menthol, Topical Analgesic, (ICY HOT ADVANCED RELIEF EX) Apply 1 application topically daily.   prn at prn  . Polyethyl Glycol-Propyl Glycol (SYSTANE ULTRA OP) Apply 1 drop to eye daily as needed (Dry  eye).   prn at prn  . polyethylene glycol (MIRALAX / GLYCOLAX) packet Take 17 g by mouth daily as needed for mild constipation.    prn at prn  . potassium chloride (K-DUR,KLOR-CON) 10 MEQ tablet Take 10 mEq by mouth daily. (Patient not taking: Reported on 03/17/2020)   Not Taking at Unknown time   Social History   Socioeconomic History  . Marital status: Widowed    Spouse name: Not on file  . Number of children: Not on file  . Years of education: Not on file  . Highest education level: Not on file  Occupational History  . Not on file  Tobacco Use  . Smoking status: Never Smoker  . Smokeless tobacco: Never Used  Vaping Use  . Vaping Use: Never used  Substance and Sexual Activity  . Alcohol use:  No  . Drug use: No  . Sexual activity: Not on file  Other Topics Concern  . Not on file  Social History Narrative  . Not on file   Social Determinants of Health   Financial Resource Strain:   . Difficulty of Paying Living Expenses:   Food Insecurity:   . Worried About Charity fundraiser in the Last Year:   . Arboriculturist in the Last Year:   Transportation Needs:   . Film/video editor (Medical):   Marland Kitchen Lack of Transportation (Non-Medical):   Physical Activity:   . Days of Exercise per Week:   . Minutes of Exercise per Session:   Stress:   . Feeling of Stress :   Social Connections:   . Frequency of Communication with Friends and Family:   . Frequency of Social Gatherings with Friends and Family:   . Attends Religious Services:   . Active Member of Clubs or Organizations:   . Attends Archivist Meetings:   Marland Kitchen Marital Status:   Intimate Partner Violence:   . Fear of Current or Ex-Partner:   . Emotionally Abused:   Marland Kitchen Physically Abused:   . Sexually Abused:     Family History  Problem Relation Age of Onset  . Heart disease Mother   . Heart attack Mother       Review of systems complete and found to be negative unless listed above      PHYSICAL EXAM  General: Well developed, well nourished, in no acute distress HEENT:  Normocephalic and atramatic Neck:  No JVD.  Lungs: Diffuse rhonchi bilaterally to auscultation and percussion. Heart: Irregular irregular tachycardic. Normal S1 and S2 without gallops or murmurs.  Abdomen: Bowel sounds are positive, abdomen soft and non-tender  Msk:  Back normal, normal gait. Normal strength and tone for age. Extremities: No clubbing, cyanosis or edema.   Neuro: Alert and oriented X 3. Psych:  Good affect, responds appropriately  Labs:   Lab Results  Component Value Date   WBC 8.0 03/18/2020   HGB 9.8 (L) 03/18/2020   HCT 32.0 (L) 03/18/2020   MCV 88.2 03/18/2020   PLT 118 (L) 03/18/2020    Recent Labs    Lab 03/17/20 0923 03/17/20 0923 03/18/20 0649  NA 137   < > 135  K 3.3*   < > 3.3*  CL 100   < > 105  CO2 26   < > 23  BUN 17   < > 13  CREATININE 1.02*   < > 0.75  CALCIUM 9.2   < > 8.1*  PROT 6.9  --   --  BILITOT 1.8*  --   --   ALKPHOS 81  --   --   ALT 10  --   --   AST 20  --   --   GLUCOSE 103*   < > 91   < > = values in this interval not displayed.   Lab Results  Component Value Date   CKTOTAL 16 (L) 06/09/2012   CKMB 0.7 06/09/2012   TROPONINI 0.03 (HH) 04/25/2017   No results found for: CHOL No results found for: HDL No results found for: LDLCALC No results found for: TRIG No results found for: CHOLHDL No results found for: LDLDIRECT    Radiology: DG Chest 2 View  Result Date: 03/17/2020 CLINICAL DATA:  Fever EXAM: CHEST - 2 VIEW COMPARISON:  01/08/2018 FINDINGS: Left pacer remains in place, unchanged. Cardiomegaly. Lungs clear. No effusions or edema. No acute bony abnormality. Degenerative changes in the shoulders. IMPRESSION: Cardiomegaly.  No active disease. Electronically Signed   By: Rolm Baptise M.D.   On: 03/17/2020 08:38   DG Lumbar Spine 2-3 Views  Result Date: 03/17/2020 CLINICAL DATA:  Right-sided low back pain after fall 2 days ago. Fever EXAM: LUMBAR SPINE - 2-3 VIEW COMPARISON:  MRI 09/05/2007 FINDINGS: Five lumbar type vertebral segments. S shaped lumbar scoliotic curvature with asymmetric disc height loss compensatory to the curve. Bones are demineralized. Vertebral body heights appear maintained without evidence of fracture. Lower lumbar facet arthrosis. IMPRESSION: 1. No evidence of acute fracture or static listhesis. 2. Scoliosis and spondylosis of the lumbar spine. Electronically Signed   By: Davina Poke D.O.   On: 03/17/2020 08:42   CT Angio Chest PE W and/or Wo Contrast  Result Date: 03/17/2020 CLINICAL DATA:  Fever of unknown origin EXAM: CT ANGIOGRAPHY CHEST CT ABDOMEN AND PELVIS WITH CONTRAST TECHNIQUE: Multidetector CT imaging of  the chest was performed using the standard protocol during bolus administration of intravenous contrast. Multiplanar CT image reconstructions and MIPs were obtained to evaluate the vascular anatomy. Multidetector CT imaging of the abdomen and pelvis was performed using the standard protocol during bolus administration of intravenous contrast. CONTRAST:  37mL OMNIPAQUE IOHEXOL 350 MG/ML SOLN COMPARISON:  None. FINDINGS: CTA CHEST FINDINGS Cardiovascular: There is preferential opacification of the pulmonary arteries. No acute pulmonary embolism. Calcified plaque along the thoracic aorta. Marked enlargement of the right atrium. Left chest wall pacemaker is present. Mediastinum/Nodes: No enlarged lymph nodes identified. Lungs/Pleura: Mild interstitial thickening, which is chronic. Mild bibasilar atelectasis/scarring. Musculoskeletal: No acute osseous abnormality. Review of the MIP images confirms the above findings. CT ABDOMEN and PELVIS FINDINGS Hepatobiliary: No focal liver abnormality is seen. No gallstones, gallbladder wall thickening, or biliary dilatation. Pancreas: Unremarkable. Spleen: Too small to characterize hypoattenuating lesion. Otherwise unremarkable. Adrenals/Urinary Tract: Adrenals are unremarkable. Bilateral too small to characterize hypoattenuating renal lesions. Left renal scarring. Bladder is unremarkable. Stomach/Bowel: Stomach is unremarkable. Bowel is normal in caliber. There is distal colonic diverticulosis. Vascular/Lymphatic: Aortic atherosclerosis. There are no enlarged lymph nodes identified. Reproductive: Status post hysterectomy. No adnexal masses. Other: No ascites. Musculoskeletal: Levoscoliosis of the lumbar spine. Multilevel degenerative changes of the included spine. No acute osseous abnormality. Review of the MIP images confirms the above findings. IMPRESSION: No acute abnormality including evidence of acute infectious process. Chronic findings detailed above. Electronically Signed    By: Macy Mis M.D.   On: 03/17/2020 14:37   CT ABDOMEN PELVIS W CONTRAST  Result Date: 03/17/2020 CLINICAL DATA:  Fever of unknown origin EXAM: CT ANGIOGRAPHY  CHEST CT ABDOMEN AND PELVIS WITH CONTRAST TECHNIQUE: Multidetector CT imaging of the chest was performed using the standard protocol during bolus administration of intravenous contrast. Multiplanar CT image reconstructions and MIPs were obtained to evaluate the vascular anatomy. Multidetector CT imaging of the abdomen and pelvis was performed using the standard protocol during bolus administration of intravenous contrast. CONTRAST:  51mL OMNIPAQUE IOHEXOL 350 MG/ML SOLN COMPARISON:  None. FINDINGS: CTA CHEST FINDINGS Cardiovascular: There is preferential opacification of the pulmonary arteries. No acute pulmonary embolism. Calcified plaque along the thoracic aorta. Marked enlargement of the right atrium. Left chest wall pacemaker is present. Mediastinum/Nodes: No enlarged lymph nodes identified. Lungs/Pleura: Mild interstitial thickening, which is chronic. Mild bibasilar atelectasis/scarring. Musculoskeletal: No acute osseous abnormality. Review of the MIP images confirms the above findings. CT ABDOMEN and PELVIS FINDINGS Hepatobiliary: No focal liver abnormality is seen. No gallstones, gallbladder wall thickening, or biliary dilatation. Pancreas: Unremarkable. Spleen: Too small to characterize hypoattenuating lesion. Otherwise unremarkable. Adrenals/Urinary Tract: Adrenals are unremarkable. Bilateral too small to characterize hypoattenuating renal lesions. Left renal scarring. Bladder is unremarkable. Stomach/Bowel: Stomach is unremarkable. Bowel is normal in caliber. There is distal colonic diverticulosis. Vascular/Lymphatic: Aortic atherosclerosis. There are no enlarged lymph nodes identified. Reproductive: Status post hysterectomy. No adnexal masses. Other: No ascites. Musculoskeletal: Levoscoliosis of the lumbar spine. Multilevel degenerative  changes of the included spine. No acute osseous abnormality. Review of the MIP images confirms the above findings. IMPRESSION: No acute abnormality including evidence of acute infectious process. Chronic findings detailed above. Electronically Signed   By: Macy Mis M.D.   On: 03/17/2020 14:37    EKG: Atrial fibrillation rapid ventricular response  ASSESSMENT AND PLAN:  Atrial fibrillation rapid ventricular response Viral febrile illness GERD COPD Chronic renal insufficiency Pharyngitis Atypical chest pain Blunted troponins probably demand ischemia Sick sinus syndrome Permanent pacemaker implant . Plan Agree with hospitalization Agree with broad-spectrum antibiotic therapy for possible sepsis Recommend rate control for atrial fibrillation Anticoagulation for atrial fibrillation Inhalers as necessary for COPD Agree with therapy for reflux probably omeprazole Recommend continue Eliquis for anticoagulation for A. Fib We will advance rate controlling drugs to help  Signed: Yolonda Kida MD 03/19/2020, 10:44 AM

## 2020-03-19 NOTE — Progress Notes (Signed)
[facility] [place] NAME: [name]    MR#:  [id number]  DATE OF BIRTH:  1922/11/09  SUBJECTIVE:    REVIEW OF SYSTEMS:   ROS Tolerating Diet: Tolerating PT:   DRUG ALLERGIES:   Allergies  Allergen Reactions  . Adhesive [Tape] Itching    Reaction: itching  . Clindamycin/Lincomycin Other (See Comments)    Reaction: upset stomach   . Codeine Other (See Comments)  . Demerol [Meperidine] Nausea Only  . Erythromycin Other (See Comments)  . Morphine And Related     Reaction: unknown   . Other Nausea Only    Darvocet  . Penicillins Rash    Has patient had a PCN reaction causing immediate rash, facial/tongue/throat swelling, SOB or lightheadedness with hypotension: unknown Has patient had a PCN reaction causing severe rash involving mucus membranes or skin necrosis: unknown Has patient had a PCN reaction that required hospitalization: unknown  Has patient had a PCN reaction occurring within the last 10 years: unknown  If all of the above answers are "NO", then may proceed with Cephalosporin use.    . Sulfa Antibiotics Rash    VITALS:  Blood pressure (!) 152/90, pulse 99, temperature 99.2 F (37.3 C), temperature source Oral, resp. rate (!) 22, height 5\' 3"  (1.6 m), weight 61.7 kg, SpO2 97 %.  PHYSICAL EXAMINATION:   Physical Exam  GENERAL:  86 y.o.-year-old patient lying in the bed with no acute distress.  EYES: Pupils equal, round, reactive to light and accommodation. No scleral icterus.   HEENT: Head atraumatic, normocephalic. Oropharynx and nasopharynx clear.  NECK:  Supple, no jugular venous distention. No thyroid enlargement, no tenderness.  LUNGS: Normal breath sounds bilaterally, no wheezing, rales, rhonchi. No use of accessory muscles of respiration.  CARDIOVASCULAR: S1, S2 normal. No murmurs, rubs, or gallops.  ABDOMEN: Soft, nontender, nondistended. Bowel sounds present. No organomegaly or mass.  EXTREMITIES: No  cyanosis, clubbing or edema b/l.    NEUROLOGIC: Cranial nerves II through XII are intact. No focal Motor or sensory deficits b/l.   PSYCHIATRIC:  patient is alert and oriented x 3.  SKIN: No obvious rash, lesion, or ulcer.   LABORATORY PANEL:  CBC Recent Labs  Lab 03/18/20 0649  WBC 8.0  HGB 9.8*  HCT 32.0*  PLT 118*    Chemistries  Recent Labs  Lab 03/17/20 0923 03/17/20 0923 03/18/20 0649  NA 137   < > 135  K 3.3*   < > 3.3*  CL 100   < > 105  CO2 26   < > 23  GLUCOSE 103*   < > 91  BUN 17   < > 13  CREATININE 1.02*   < > 0.75  CALCIUM 9.2   < > 8.1*  AST 20  --   --   ALT 10  --   --   ALKPHOS 81  --   --   BILITOT 1.8*  --   --    < > = values in this interval not displayed.   Cardiac Enzymes No results for input(s): TROPONINI in the last 168 hours. RADIOLOGY:  CT Angio Chest PE W and/or Wo Contrast  Result Date: 03/17/2020 CLINICAL DATA:  Fever of unknown origin EXAM: CT ANGIOGRAPHY CHEST CT ABDOMEN AND PELVIS WITH CONTRAST TECHNIQUE: Multidetector CT imaging of the chest was performed using the standard protocol during bolus administration of intravenous contrast. Multiplanar CT image reconstructions and MIPs were obtained to evaluate  the vascular anatomy. Multidetector CT imaging of the abdomen and pelvis was performed using the standard protocol during bolus administration of intravenous contrast. CONTRAST:  40mL OMNIPAQUE IOHEXOL 350 MG/ML SOLN COMPARISON:  None. FINDINGS: CTA CHEST FINDINGS Cardiovascular: There is preferential opacification of the pulmonary arteries. No acute pulmonary embolism. Calcified plaque along the thoracic aorta. Marked enlargement of the right atrium. Left chest wall pacemaker is present. Mediastinum/Nodes: No enlarged lymph nodes identified. Lungs/Pleura: Mild interstitial thickening, which is chronic. Mild bibasilar atelectasis/scarring. Musculoskeletal: No acute osseous abnormality. Review of the MIP images confirms the above findings.  CT ABDOMEN and PELVIS FINDINGS Hepatobiliary: No focal liver abnormality is seen. No gallstones, gallbladder wall thickening, or biliary dilatation. Pancreas: Unremarkable. Spleen: Too small to characterize hypoattenuating lesion. Otherwise unremarkable. Adrenals/Urinary Tract: Adrenals are unremarkable. Bilateral too small to characterize hypoattenuating renal lesions. Left renal scarring. Bladder is unremarkable. Stomach/Bowel: Stomach is unremarkable. Bowel is normal in caliber. There is distal colonic diverticulosis. Vascular/Lymphatic: Aortic atherosclerosis. There are no enlarged lymph nodes identified. Reproductive: Status post hysterectomy. No adnexal masses. Other: No ascites. Musculoskeletal: Levoscoliosis of the lumbar spine. Multilevel degenerative changes of the included spine. No acute osseous abnormality. Review of the MIP images confirms the above findings. IMPRESSION: No acute abnormality including evidence of acute infectious process. Chronic findings detailed above. Electronically Signed   By: Macy Mis M.D.   On: 03/17/2020 14:37   CT ABDOMEN PELVIS W CONTRAST  Result Date: 03/17/2020 CLINICAL DATA:  Fever of unknown origin EXAM: CT ANGIOGRAPHY CHEST CT ABDOMEN AND PELVIS WITH CONTRAST TECHNIQUE: Multidetector CT imaging of the chest was performed using the standard protocol during bolus administration of intravenous contrast. Multiplanar CT image reconstructions and MIPs were obtained to evaluate the vascular anatomy. Multidetector CT imaging of the abdomen and pelvis was performed using the standard protocol during bolus administration of intravenous contrast. CONTRAST:  10mL OMNIPAQUE IOHEXOL 350 MG/ML SOLN COMPARISON:  None. FINDINGS: CTA CHEST FINDINGS Cardiovascular: There is preferential opacification of the pulmonary arteries. No acute pulmonary embolism. Calcified plaque along the thoracic aorta. Marked enlargement of the right atrium. Left chest wall pacemaker is present.  Mediastinum/Nodes: No enlarged lymph nodes identified. Lungs/Pleura: Mild interstitial thickening, which is chronic. Mild bibasilar atelectasis/scarring. Musculoskeletal: No acute osseous abnormality. Review of the MIP images confirms the above findings. CT ABDOMEN and PELVIS FINDINGS Hepatobiliary: No focal liver abnormality is seen. No gallstones, gallbladder wall thickening, or biliary dilatation. Pancreas: Unremarkable. Spleen: Too small to characterize hypoattenuating lesion. Otherwise unremarkable. Adrenals/Urinary Tract: Adrenals are unremarkable. Bilateral too small to characterize hypoattenuating renal lesions. Left renal scarring. Bladder is unremarkable. Stomach/Bowel: Stomach is unremarkable. Bowel is normal in caliber. There is distal colonic diverticulosis. Vascular/Lymphatic: Aortic atherosclerosis. There are no enlarged lymph nodes identified. Reproductive: Status post hysterectomy. No adnexal masses. Other: No ascites. Musculoskeletal: Levoscoliosis of the lumbar spine. Multilevel degenerative changes of the included spine. No acute osseous abnormality. Review of the MIP images confirms the above findings. IMPRESSION: No acute abnormality including evidence of acute infectious process. Chronic findings detailed above. Electronically Signed   By: Macy Mis M.D.   On: 03/17/2020 14:37   ASSESSMENT AND PLAN:   Nasra Counce Moonis a 86 y.o.femalewith medical history significant forhypertension who was brought into the ER by EMS for evaluation of fever, tachycardia and back pain. Patient was status of sustained a fall 2 days prior to her hospitalization and has complained of low back pain since then. Per nursing home staff she had a fever with a T-max of  102Fand was also tachycardic. Patient complains of a sore throat with associated nausea but denies having any vomiting.   SIRS etio unclear -suspected due to Pharyngitis? viral syndrome, UC MRSA + -Patient was sent from the nursing home  for evaluation of fever with a T-max of 102,tachypneaand sinus tachycardia. -low grade fever of 99--today afebrile -weak Follow-up blood cultures negative -UC MRSA-- Consulted ID -pt unable to give me any definite UTI symptoms. Urine very concentrated -CT chest abdomen pelvis -- no acute infectious etiology found  -strep throat negative -COVID negative -continue supportive care PRN Tylenol on IV fluids  Hypothyroidism ContinueSynthroid  acute renal failure likely due to poor PO intake -came in with creatinine of 1.02--- IV fluids-- 0.75 -looks clinically dry--not much po intake--increase IVF to 100 cc/hr  Persistent A. Fibwith rapid ventricular rate and h/o SSS -Patient EKG showed sinus tachycardia but repeat EKG done due topersistenttachycardia showedrapid atrial fibrillation -Continue metoprolol --Continue Eliquis as primary prophylaxis for an acute stroke  -Cardiology consult with dr Clayborn Bigness since HR remains on the higher side. -increased BB to 37.5 mg bid  Hypokalemia Secondary to diuretic use Supplement potassium  PT to see pt  DVT prophylaxis:Eliquis Code Status:DNR at admission Family Communication:patient's daughterJune in the room Disposition Plan:Back to previous home environment--[facility] Consults called:None  Status is: Inpatient  Remains inpatient appropriate because:Ongoing diagnostic testing needed not appropriate for outpatient work up   Dispo: The patient is from: SNF  Anticipated d/c is to: SNF  Anticipated d/c date is: 2 days  Patient currently is not medically stable to d/c. Fever w/u ID and cardiology to see her PT consult    TOTAL TIME TAKING CARE OF THIS PATIENT: *25* minutes.  >50% time spent on counselling and coordination of care  Note: This dictation was prepared with Dragon dictation along with smaller phrase technology. Any transcriptional errors that result from this  process are unintentional.  Fritzi Mandes M.D    [facility]   CC: Primary care physician; Idelle Crouch, MDPatient ID: [name], female   DOB: [birth year], 86 y.o.   MRN: [id number]

## 2020-03-19 NOTE — Progress Notes (Signed)
Brief Cross Cover Note Nurse reported patient with wheezing and increased work of breathing.  Bedside - patietntwith crackles and expiratory wheeze . Chest xray cardiomegaly with mild vascular congestion Nebs prn for wheezing Lasix Iv fluids discontinued 40 meq of potassium given to prevent potassium depletion as she has had hypokalemia

## 2020-03-19 NOTE — Consult Note (Signed)
NAME: [name]  DOB: [birth year]  MRN: [id number]  Date/Time: 03/19/2020 11:39 AM  REQUESTING PROVIDER: Dr. Posey Pronto Subjective:  REASON FOR CONSULT: MRSA in the urine ?Chart reviewed and spoke to the patient's daughter at bedside.  No history available from patient as she is confused and a poor historian. [name] is a 86 y.o. female with a history of A. fib, CAD, pacemaker for sick sinus syndrome, history of PE, history of MRSA pneumonia Presents to the ED referred from [facility] because of fever of 102. As per daughter patient used to live on her own with 24-hour aides and was recently sent to [facility] last Friday which is July 9.  She fell at [facility] in July 10 and was complaining of low back pain.  She was sent to the ED on 03/17/2020  As per the ED note she was complaining of sore throat and back pain.  She she was recently given a course of antibiotics by her PCP Dr. Doy Hutching on 03/04/2020 for a left skin wound in the leg.  She was prescribed Levaquin for 10 days.  No cultures were sent.  The ED her temperature was 99.3, BP of 139/78, and heart rate of 138.  This was irregular. Blood cultures were sent Urine analysis did not reveal any WBC.  CT chest and abdomen did not show any acute abnormality.  A urine culture came back as MRSA.  Blood culture has been negative so far.  I am asked to see the patient for the MRSA. Patient has not registered any fever since hospitalization.  But she is remained confused and lethargic. Today she is trying to climb out of bed.  She says she wants to go pass urine.  She had a breakfast.  As per her daughter she is more energetic today.  She has received IV fluids yesterday and her creatinine which was 1 on admission is now 0.75. As per daughter her condition has been deteriorating for the past 6 months.  She has been weak and also her memory has been poor and has been.'s of confusion every single day.  She does have a normal conversation in  between.  Past Medical History:  Diagnosis Date  . Arthritis   . Blood clot in vein   . CAD (coronary artery disease)   . Cancer (HCC)    squamous cell carcinoma  . COPD (chronic obstructive pulmonary disease) ([place])   . Diverticulitis   . DVT (deep vein thrombosis) in pregnancy   . GERD (gastroesophageal reflux disease)   . Myocardial infarction ([place])   . Pacemaker   . PE (pulmonary thromboembolism) ([place])   . Scoliosis   . Stroke ([place])   . Thyroid disease   . TIA (transient ischemic attack)     Past Surgical History:  Procedure Laterality Date  . APPENDECTOMY    . BREAST SURGERY    . EYE SURGERY    . PACEMAKER IMPLANT    . TONSILLECTOMY      Social History   Socioeconomic History  . Marital status: Widowed    Spouse name: Not on file  . Number of children: Not on file  . Years of education: Not on file  . Highest education level: Not on file  Occupational History  . Not on file  Tobacco Use  . Smoking status: Never Smoker  . Smokeless tobacco: Never Used  Vaping Use  . Vaping Use: Never used  Substance and Sexual Activity  .  Alcohol use: No  . Drug use: No  . Sexual activity: Not on file  Other Topics Concern  . Not on file  Social History Narrative  . Not on file   Social Determinants of Health   Financial Resource Strain:   . Difficulty of Paying Living Expenses:   Food Insecurity:   . Worried About Charity fundraiser in the Last Year:   . Arboriculturist in the Last Year:   Transportation Needs:   . Film/video editor (Medical):   Marland Kitchen Lack of Transportation (Non-Medical):   Physical Activity:   . Days of Exercise per Week:   . Minutes of Exercise per Session:   Stress:   . Feeling of Stress :   Social Connections:   . Frequency of Communication with Friends and Family:   . Frequency of Social Gatherings with Friends and Family:   . Attends Religious Services:   . Active Member of Clubs or Organizations:   . Attends Archivist  Meetings:   Marland Kitchen Marital Status:   Intimate Partner Violence:   . Fear of Current or Ex-Partner:   . Emotionally Abused:   Marland Kitchen Physically Abused:   . Sexually Abused:     Family History  Problem Relation Age of Onset  . Heart disease Mother   . Heart attack Mother    Allergies  Allergen Reactions  . Adhesive [Tape] Itching    Reaction: itching  . Clindamycin/Lincomycin Other (See Comments)    Reaction: upset stomach   . Codeine Other (See Comments)  . Demerol [Meperidine] Nausea Only  . Erythromycin Other (See Comments)  . Morphine And Related     Reaction: unknown   . Other Nausea Only    Darvocet  . Penicillins Rash    Has patient had a PCN reaction causing immediate rash, facial/tongue/throat swelling, SOB or lightheadedness with hypotension: unknown Has patient had a PCN reaction causing severe rash involving mucus membranes or skin necrosis: unknown Has patient had a PCN reaction that required hospitalization: unknown  Has patient had a PCN reaction occurring within the last 10 years: unknown  If all of the above answers are "NO", then may proceed with Cephalosporin use.    . Sulfa Antibiotics Rash    ? Current Facility-Administered Medications  Medication Dose Route Frequency Provider Last Rate Last Admin  . 0.9 %  sodium chloride infusion   Intravenous Continuous Fritzi Mandes, MD 100 mL/hr at 03/19/20 0927 New Bag at 03/19/20 0927  . acetaminophen (TYLENOL) 160 MG/5ML solution 650 mg  650 mg Oral Once Delman Kitten, MD      . apixaban Arne Cleveland) tablet 2.5 mg  2.5 mg Oral BID Agbata, Tochukwu, MD   2.5 mg at 03/19/20 0947  . aspirin EC tablet 81 mg  81 mg Oral Daily Agbata, Tochukwu, MD   81 mg at 03/19/20 0946  . Chlorhexidine Gluconate Cloth 2 % PADS 6 each  6 each Topical Q0600 Fritzi Mandes, MD   6 each at 03/19/20 0553  . fluticasone (FLONASE) 50 MCG/ACT nasal spray 1 spray  1 spray Each Nare Daily PRN Agbata, Tochukwu, MD      . iopamidol (ISOVUE-370) 76 % injection  75 mL  75 mL Intravenous Once PRN Delman Kitten, MD      . levothyroxine (SYNTHROID) tablet 50 mcg  50 mcg Oral QAC breakfast Agbata, Tochukwu, MD   50 mcg at 03/19/20 0947  . magic mouthwash  5 mL Oral TID Posey Pronto,  Sona, MD   5 mL at 03/19/20 0946   And  . lidocaine (XYLOCAINE) 2 % viscous mouth solution 5 mL  5 mL Mouth/Throat TID Fritzi Mandes, MD      . loratadine (CLARITIN) tablet 10 mg  10 mg Oral Daily PRN Agbata, Tochukwu, MD      . melatonin tablet 5 mg  5 mg Oral QHS Ouma, Bing Neighbors, NP      . metoprolol tartrate (LOPRESSOR) tablet 37.5 mg  37.5 mg Oral BID Fritzi Mandes, MD   37.5 mg at 03/19/20 0948  . mupirocin ointment (BACTROBAN) 2 % 1 application  1 application Nasal BID Fritzi Mandes, MD   1 application at 19/50/93 0959  . ondansetron (ZOFRAN) tablet 4 mg  4 mg Oral Q6H PRN Agbata, Tochukwu, MD       Or  . ondansetron (ZOFRAN) injection 4 mg  4 mg Intravenous Q6H PRN Agbata, Tochukwu, MD      . phenol-menthol (CEPASTAT) lozenge 1 lozenge  1 lozenge Buccal PRN Fritzi Mandes, MD      . polyethylene glycol (MIRALAX / GLYCOLAX) packet 17 g  17 g Oral Daily PRN Agbata, Tochukwu, MD   17 g at 03/18/20 0901  . polyvinyl alcohol (LIQUIFILM TEARS) 1.4 % ophthalmic solution 1 drop  1 drop Both Eyes Daily PRN Agbata, Tochukwu, MD      . polyvinyl alcohol (LIQUIFILM TEARS) 1.4 % ophthalmic solution 1 drop  1 drop Both Eyes TID Fritzi Mandes, MD      . potassium chloride SA (KLOR-CON) CR tablet 10 mEq  10 mEq Oral Daily Agbata, Tochukwu, MD   10 mEq at 03/19/20 0947  . potassium chloride SA (KLOR-CON) CR tablet 40 mEq  40 mEq Oral Once Agbata, Tochukwu, MD      . predniSONE (DELTASONE) tablet 5 mg  5 mg Oral Q breakfast Agbata, Tochukwu, MD   5 mg at 03/19/20 0948  . senna (SENOKOT) tablet 8.6 mg  1 tablet Oral BID Agbata, Tochukwu, MD   8.6 mg at 03/19/20 0947  . traMADol (ULTRAM) tablet 50 mg  50 mg Oral QID Agbata, Tochukwu, MD   50 mg at 03/19/20 0947  . vitamin B-12 (CYANOCOBALAMIN) tablet  1,000 mcg  1,000 mcg Oral Daily Agbata, Tochukwu, MD   1,000 mcg at 03/19/20 0947     Abtx:  Anti-infectives (From admission, onward)   None      REVIEW OF SYSTEMS:  Not available Objective:  VITALS:  BP (!) 151/86 (BP Location: Left Arm)   Pulse 99   Temp 98.4 F (36.9 C) (Oral)   Resp 19   Ht 5\' 3"  (1.6 m)   Wt 61.7 kg   SpO2 97%   BMI 24.10 kg/m  PHYSICAL EXAM:  General: Somnolent but sits up in the bed and trying to  get out of bed Head: Normocephalic, without obvious abnormality, atraumatic. Eyes: Conjunctivae clear, anicteric sclerae. Pupils are equal ENT Nares normal. No drainage or sinus tenderness. Lips, mucosa, and tongue normal. No Thrush Neck: Supple,  Lungs: Bilateral air entry. Heart: Irregular Abdomen: Soft, non-tender,not distended. Bowel sounds normal. No masses Extremities: Bruising and thin skin no obvious wounds skin: No rashes or lesions. Or bruising Lymph: Cervical, supraclavicular normal. Neurologic: Cannot be examined in detail Pertinent Labs Lab Results CBC    Component Value Date/Time   WBC 8.0 03/18/2020 0649   RBC 3.63 (L) 03/18/2020 0649   HGB 9.8 (L) 03/18/2020 0649   HGB 10.4 (L) 10/02/2012 0354  HCT 32.0 (L) 03/18/2020 0649   HCT 32.0 (L) 10/02/2012 0354   PLT 118 (L) 03/18/2020 0649   PLT 269 10/02/2012 0354   MCV 88.2 03/18/2020 0649   MCV 85 10/02/2012 0354   MCH 27.0 03/18/2020 0649   MCHC 30.6 03/18/2020 0649   RDW 14.7 03/18/2020 0649   RDW 16.9 (H) 10/02/2012 0354   LYMPHSABS 1.1 03/17/2020 0923   LYMPHSABS 1.6 10/02/2012 0354   MONOABS 0.7 03/17/2020 0923   MONOABS 0.8 10/02/2012 0354   EOSABS 0.1 03/17/2020 0923   EOSABS 0.3 10/02/2012 0354   BASOSABS 0.1 03/17/2020 0923   BASOSABS 0.1 10/02/2012 0354    CMP Latest Ref Rng & Units 03/18/2020 03/17/2020 12/10/2017  Glucose 70 - 99 mg/dL 91 103(H) 84  BUN 8 - 23 mg/dL 13 17 19   Creatinine 0.44 - 1.00 mg/dL 0.75 1.02(H) 1.05(H)  Sodium 135 - 145 mmol/L 135 137 141   Potassium 3.5 - 5.1 mmol/L 3.3(L) 3.3(L) 3.8  Chloride 98 - 111 mmol/L 105 100 106  CO2 22 - 32 mmol/L 23 26 27   Calcium 8.9 - 10.3 mg/dL 8.1(L) 9.2 9.0  Total Protein 6.5 - 8.1 g/dL - 6.9 -  Total Bilirubin 0.3 - 1.2 mg/dL - 1.8(H) -  Alkaline Phos 38 - 126 U/L - 81 -  AST 15 - 41 U/L - 20 -  ALT 0 - 44 U/L - 10 -      Microbiology: Recent Results (from the past 240 hour(s))  Blood Culture (routine x 2)     Status: None (Preliminary result)   Collection Time: 03/17/20  9:23 AM   Specimen: BLOOD  Result Value Ref Range Status   Specimen Description BLOOD LEFT WRIST  Final   Special Requests   Final    BOTTLES DRAWN AEROBIC AND ANAEROBIC Blood Culture adequate volume   Culture   Final    NO GROWTH 2 DAYS Performed at [facility], [address] [place], [place] [address]    Report Status PENDING  Incomplete  SARS Coronavirus 2 by RT PCR (hospital order, performed in [facility] hospital lab) Nasopharyngeal Nasopharyngeal Swab     Status: None   Collection Time: 03/17/20  9:23 AM   Specimen: Nasopharyngeal Swab  Result Value Ref Range Status   SARS Coronavirus 2 NEGATIVE NEGATIVE Final    Comment: (NOTE) SARS-CoV-2 target nucleic acids are NOT DETECTED.  The SARS-CoV-2 RNA is generally detectable in upper and lower respiratory specimens during the acute phase of infection. The lowest concentration of SARS-CoV-2 viral copies this assay can detect is 250 copies / mL. A negative result does not preclude SARS-CoV-2 infection and should not be used as the sole basis for treatment or other patient management decisions.  A negative result may occur with improper specimen collection / handling, submission of specimen other than nasopharyngeal swab, presence of viral mutation(s) within the areas targeted by this assay, and inadequate number of viral copies (<250 copies / mL). A negative result must be combined with clinical observations, patient history, and  epidemiological information.  Fact Sheet for Patients:   StrictlyIdeas.no  Fact Sheet for Healthcare Providers: BankingDealers.co.za  This test is not yet approved or  cleared by the [place] FDA and has been authorized for detection and/or diagnosis of SARS-CoV-2 by FDA under an Emergency Use Authorization (EUA).  This EUA will remain in effect (meaning this test can be used) for the duration of the COVID-19 declaration under Section 564(b)(1) of the Act,  21 U.S.C. section 360bbb-3(b)(1), unless the authorization is terminated or revoked sooner.  Performed at [facility], [place], [place] [address]   Group A Strep by PCR [facility] Only)     Status: None   Collection Time: 03/17/20  9:23 AM   Specimen: Nasopharyngeal Swab; Sterile Swab  Result Value Ref Range Status   Group A Strep by PCR NOT DETECTED NOT DETECTED Final    Comment: Performed at [facility], [address] [place], [place] [address]  Blood Culture (routine x 2)     Status: None (Preliminary result)   Collection Time: 03/17/20  9:25 AM   Specimen: BLOOD  Result Value Ref Range Status   Specimen Description BLOOD LEFT FOREARM  Final   Special Requests   Final    BOTTLES DRAWN AEROBIC AND ANAEROBIC Blood Culture results may not be optimal due to an excessive volume of blood received in culture bottles   Culture   Final    NO GROWTH 2 DAYS Performed at [facility], [address] [place], [place] [address]    Report Status PENDING  Incomplete  Urine Culture     Status: Abnormal   Collection Time: 03/17/20 11:29 AM   Specimen: Urine, Random  Result Value Ref Range Status   Specimen Description   Final    URINE, RANDOM Performed at [facility], [address] [place], [place] [address]    Special Requests   Final    NONE Performed at [facility], [address] [place], [place] [address]      Culture (A)  Final    >=100,000 COLONIES/mL METHICILLIN RESISTANT STAPHYLOCOCCUS AUREUS   Report Status 03/19/2020 FINAL  Final   Organism ID, Bacteria METHICILLIN RESISTANT STAPHYLOCOCCUS AUREUS (A)  Final      Susceptibility   Methicillin resistant staphylococcus aureus - MIC*    CIPROFLOXACIN >=8 RESISTANT Resistant     GENTAMICIN <=0.5 SENSITIVE Sensitive     NITROFURANTOIN <=16 SENSITIVE Sensitive     OXACILLIN >=4 RESISTANT Resistant     TETRACYCLINE <=1 SENSITIVE Sensitive     VANCOMYCIN <=0.5 SENSITIVE Sensitive     TRIMETH/SULFA <=10 SENSITIVE Sensitive     CLINDAMYCIN >=8 RESISTANT Resistant     RIFAMPIN <=0.5 SENSITIVE Sensitive     Inducible Clindamycin NEGATIVE Sensitive     * >=100,000 COLONIES/mL METHICILLIN RESISTANT STAPHYLOCOCCUS AUREUS  MRSA PCR Screening     Status: Abnormal   Collection Time: 03/18/20  6:50 PM   Specimen: Nasopharyngeal  Result Value Ref Range Status   MRSA by PCR POSITIVE (A) NEGATIVE Final    Comment:        The GeneXpert MRSA Assay (FDA approved for NASAL specimens only), is one component of a comprehensive MRSA colonization surveillance program. It is not intended to diagnose MRSA infection nor to guide or monitor treatment for MRSA infections. RESULT CALLED TO, READ BACK BY AND VERIFIED WITH: DOREEN APALOO 03/18/20 @2027  BY ACR Performed at [facility], [address] [place], [place] [address]     IMAGING RESULTS:  I have personally reviewed the films CT chest shows no pneumonia ? Impression/Recommendation ?86 year old female presents from the nursing home with fever and tachycardia but has been afebrile since admission to the hospital. She is not currently on any antibiotics. She is her altered mental status.  She was dehydrated on admission. The labs revealed negative strep PCR throat, blood cultures so far negative, urine culture has MRSA  and her nares is also colonized with MRSA.  MRSA in the urine: With no  pyuria this could either be a contamination from the skin or could be spillover from the blood.  The blood culture has been negative.  ?So will observe patient without antibiotics. If her temperature increases or leukocytosis or her condition worsens then we need to repeat blood cultures and may have to start vancomycin.  A. fib: Poorly controlled currently Was on low-dose metoprolol.  Has been seen by cardiology. Has a pacemaker  Encephalopathy: Combination of underlying neurocognitive impairment with dehydration .  Need to observe closely for infection that can become obvious  History of fall.  No fracture of the lumbar spine.  History of MRSA pneumonia in 2013.  Discussed with daughter in great detail. Palliative consult will help to define goals of treatment and expectations. ___________________________________________________ Discussed with daughter requesting provider Note:  This document was prepared using Dragon voice recognition software and may include unintentional dictation errors.

## 2020-03-19 NOTE — Progress Notes (Signed)
Went in to review pts red mews  Retook vs. Alert. No distress.  Mews green upom retaking of vs

## 2020-03-20 DIAGNOSIS — Z515 Encounter for palliative care: Secondary | ICD-10-CM | POA: Diagnosis not present

## 2020-03-20 DIAGNOSIS — B349 Viral infection, unspecified: Secondary | ICD-10-CM | POA: Diagnosis not present

## 2020-03-20 DIAGNOSIS — Z66 Do not resuscitate: Secondary | ICD-10-CM

## 2020-03-20 DIAGNOSIS — Z7189 Other specified counseling: Secondary | ICD-10-CM

## 2020-03-20 DIAGNOSIS — J029 Acute pharyngitis, unspecified: Secondary | ICD-10-CM | POA: Diagnosis not present

## 2020-03-20 DIAGNOSIS — I480 Paroxysmal atrial fibrillation: Secondary | ICD-10-CM | POA: Diagnosis not present

## 2020-03-20 DIAGNOSIS — R6510 Systemic inflammatory response syndrome (SIRS) of non-infectious origin without acute organ dysfunction: Secondary | ICD-10-CM | POA: Diagnosis not present

## 2020-03-20 LAB — CBC
HCT: 33.8 % — ABNORMAL LOW (ref 36.0–46.0)
Hemoglobin: 10.8 g/dL — ABNORMAL LOW (ref 12.0–15.0)
MCH: 27.1 pg (ref 26.0–34.0)
MCHC: 32 g/dL (ref 30.0–36.0)
MCV: 84.7 fL (ref 80.0–100.0)
Platelets: 173 10*3/uL (ref 150–400)
RBC: 3.99 MIL/uL (ref 3.87–5.11)
RDW: 14.3 % (ref 11.5–15.5)
WBC: 10.6 10*3/uL — ABNORMAL HIGH (ref 4.0–10.5)
nRBC: 0 % (ref 0.0–0.2)

## 2020-03-20 MED ORDER — KETOROLAC TROMETHAMINE 30 MG/ML IJ SOLN
15.0000 mg | Freq: Four times a day (QID) | INTRAMUSCULAR | Status: DC | PRN
  Administered 2020-03-21 – 2020-03-23 (×6): 15 mg via INTRAVENOUS
  Filled 2020-03-20 (×8): qty 1

## 2020-03-20 MED ORDER — LORAZEPAM 2 MG/ML IJ SOLN
0.5000 mg | Freq: Once | INTRAMUSCULAR | Status: AC
  Administered 2020-03-20: 06:00:00 0.5 mg via INTRAVENOUS
  Filled 2020-03-20: qty 1

## 2020-03-20 NOTE — Progress Notes (Signed)
ID  Patient Vitals for the past 24 hrs:  BP Temp Temp src Pulse Resp SpO2  03/20/20 1330 -- -- -- 68 18 --  03/20/20 1243 102/61 98 F (36.7 C) Oral (!) 48 (!) 22 (!) 66 %  03/20/20 0626 100/65 (!) 97.5 F (36.4 C) -- 85 -- 98 %  03/19/20 2128 (!) 130/91 (!) 97.5 F (36.4 C) Oral 80 20 99 %  03/19/20 2046 (!) 140/97 (!) 97.5 F (36.4 C) Oral (!) 133 20 95 %  03/19/20 1550 (!) 138/92 98.7 F (37.1 C) Oral 67 20 98 %  03/19/20 1536 (!) 139/97 98.7 F (37.1 C) Oral (!) 132 (!) 30 99 %   She has been somnolent since she received the Ativan this morning. Daughter at bedside CBC Latest Ref Rng & Units 03/20/2020 03/18/2020 03/17/2020  WBC 4.0 - 10.5 K/uL 10.6(H) 8.0 8.9  Hemoglobin 12.0 - 15.0 g/dL 10.8(L) 9.8(L) 11.7(L)  Hematocrit 36 - 46 % 33.8(L) 32.0(L) 38.0  Platelets 150 - 400 K/uL 173 118(L) 176    CMP Latest Ref Rng & Units 03/18/2020 03/17/2020 12/10/2017  Glucose 70 - 99 mg/dL 91 103(H) 84  BUN 8 - 23 mg/dL 13 17 19   Creatinine 0.44 - 1.00 mg/dL 0.75 1.02(H) 1.05(H)  Sodium 135 - 145 mmol/L 135 137 141  Potassium 3.5 - 5.1 mmol/L 3.3(L) 3.3(L) 3.8  Chloride 98 - 111 mmol/L 105 100 106  CO2 22 - 32 mmol/L 23 26 27   Calcium 8.9 - 10.3 mg/dL 8.1(L) 9.2 9.0  Total Protein 6.5 - 8.1 g/dL - 6.9 -  Total Bilirubin 0.3 - 1.2 mg/dL - 1.8(H) -  Alkaline Phos 38 - 126 U/L - 81 -  AST 15 - 41 U/L - 20 -  ALT 0 - 44 U/L - 10 -   Impression recommendation 86 year old female presents from the nursing home with fever and tachycardia but has been afebrile since admission to the hospital. She is not currently on any antibiotics. She is her altered mental status.  She was dehydrated on admission. The labs revealed negative strep PCR throat, blood cultures so far negative, urine culture has MRSA and her nares is also colonized with MRSA.  MRSA in the urine: usually MRSA does not cause UTI- if seen in the urine it is usually a spill over from blood or a skin contaminant  The blood culture  has been negative.  ?So will observe patient without antibiotics. If her temperature increases or leukocytosis or her condition worsens then we need to repeat blood cultures and may have to start vancomycin.  A. fib: Poorly controlled currently Was on low-dose metoprolol.  Has been seen by cardiology. Has a pacemaker  Encephalopathy: Combination of underlying neurocognitive impairment with dehydration .  Need to observe closely for infection that can become obvious  History of fall.  No fracture of the lumbar spine.  History of MRSA pneumonia in 2013.  Discussed with daughter in great detail. Palliative has seen the patient and they have a family meeting tomorrow

## 2020-03-20 NOTE — TOC Initial Note (Signed)
Transition of Care [facility]) - Initial/Assessment Note    Patient Details  Name: [name] MRN: [id number] Date of Birth: 21-Jul-1923  Transition of [facility]) CM/SW Contact:    Elease Hashimoto, LCSW Phone Number: 03/20/2020, 10:52 AM  Clinical Narrative:  Met with daughter-June at the bedside to discuss plan when medically ready for discharge. They have recently moved pt to [facility] 7/9 and now admitted 7/12. Daughter reports pt was home with 24 hr caregivers but they needed to conserve her assets and work on [employer] eligibility. Have paid through the month of July. Plan to meet with Palliative today to discuss goals of care. Daughter would like to be abel to get her other sister here will see if can find out who will be seeing them today from Palliative and continue to work on discharge plan. Spoke with [facility] yesterday and they will take her back when ready.                 Expected Discharge Plan: Long Term Nursing Home Barriers to Discharge: Continued Medical Work up   Patient Goals and CMS Choice Patient states their goals for this hospitalization and ongoing recovery are:: Plan for her to return to [facility] when medically ready CMS Medicare.gov Compare Post Acute Care list provided to:: Patient Represenative (must comment) (JUne-daughter)    Expected Discharge Plan and Services Expected Discharge Plan: [place] In-house Referral: Clinical Social Work   Post Acute Care Choice: Nursing Home Living arrangements for the past 2 months: [place]                                      Prior Living Arrangements/Services Living arrangements for the past 2 months: [place] Lives with:: Facility Resident Patient language and need for interpreter reviewed:: No Do you feel safe going back to the place where you live?: Yes      Need for Family Participation in Patient Care: Yes (Comment) Care giver  support system in place?: Yes (comment)   Criminal Activity/Legal Involvement Pertinent to Current Situation/Hospitalization: No - Comment as needed  Activities of Daily Living Home Assistive Devices/Equipment: Eyeglasses, Environmental consultant (specify type) ADL Screening (condition at time of admission) Patient's cognitive ability adequate to safely complete daily activities?: No Is the patient deaf or have difficulty hearing?: Yes Does the patient have difficulty seeing, even when wearing glasses/contacts?: Yes Does the patient have difficulty concentrating, remembering, or making decisions?: No Patient able to express need for assistance with ADLs?: Yes Does the patient have difficulty dressing or bathing?: Yes Independently performs ADLs?: No Communication: Independent Dressing (OT): Needs assistance Is this a change from baseline?: Pre-admission baseline Grooming: Needs assistance Is this a change from baseline?: Pre-admission baseline Feeding: Independent Bathing: Needs assistance Is this a change from baseline?: Pre-admission baseline Toileting: Needs assistance Is this a change from baseline?: Pre-admission baseline In/Out Bed: Needs assistance Is this a change from baseline?: Pre-admission baseline Walks in Home: Needs assistance Is this a change from baseline?: Pre-admission baseline Does the patient have difficulty walking or climbing stairs?: Yes Weakness of Legs: Both Weakness of Arms/Hands: None  Permission Sought/Granted Permission sought to share information with : Family Supports, Customer service manager Permission granted to share information with : Yes, Verbal Permission Granted  Share Information with NAME: June  Permission granted to share info w AGENCY: Dietitian  granted to share info w Relationship: daughter  Permission granted to share info w Contact Information: leslie  Emotional Assessment Appearance:: Appears stated  age Attitude/Demeanor/Rapport: Unable to Assess (lethargic and confused) Affect (typically observed): Unable to Assess Orientation: : Oriented to Self Alcohol / Substance Use: Never Used Psych Involvement: No (comment)  Admission diagnosis:  SIRS (systemic inflammatory response syndrome) (HCC) [R65.10] Acute viral syndrome [B34.9] Paroxysmal atrial fibrillation with rapid ventricular response (HCC) [I48.0] Pharyngitis, unspecified etiology [J02.9] Patient Active Problem List   Diagnosis Date Noted  . SIRS (systemic inflammatory response syndrome) ([place]) 03/18/2020  . Pharyngitis with viral syndrome 03/17/2020  . Acute viral syndrome 03/17/2020  . AF (paroxysmal atrial fibrillation) ([place]) 03/17/2020  . Paroxysmal atrial fibrillation with rapid ventricular response ([place]) 03/17/2020  . Hallucinations 12/09/2017  . CAD (coronary artery disease) 12/09/2017  . Hypothyroidism 12/09/2017  . COPD (chronic obstructive pulmonary disease) ([place]) 12/09/2017  . Acute renal failure superimposed on stage 3 chronic kidney disease ([place]) 12/09/2017  . GERD (gastroesophageal reflux disease) 12/09/2017  . Chest pain 04/25/2017  . Chest pain, pleuritic 09/08/2015   PCP:  Idelle Crouch, MD Pharmacy:   [facility] [place], [address] [place] [address] Phone: [phone] Fax: [phone]     Social Determinants of Health (SDOH) Interventions    Readmission Risk Interventions No flowsheet data found.

## 2020-03-20 NOTE — TOC Progression Note (Signed)
Transition of Care [facility]) - Progression Note    Patient Details  Name: HALIMA FOGAL MRN: [id number] Date of Birth: [birth year]  Transition of Care [facility]) CM/SW Contact  Cyani Kallstrom, Gard[place] Rhyme, LCSW Phone Number: 03/20/2020, 1:15 PM  Clinical Narrative:   Spoke with [facility]-Palliative who reports pt is appropriate for hospice once back in [facility]. They are umbralled under [facility]. Have contacted [place] to inform and she will reach out and call daughter to answer her questions. She will call June and talk with her. Will work on discharge plan back to [facility]    Expected Discharge Plan: [place] Barriers to Discharge: Continued Medical Work up  Expected Discharge Plan and Services Expected Discharge Plan: [place] In-house Referral: Clinical Social Work   Post Acute Care Choice: Nursing Home Living arrangements for the past 2 months: [place]                                       Social Determinants of Health (SDOH) Interventions    Readmission Risk Interventions No flowsheet data found.

## 2020-03-20 NOTE — Progress Notes (Signed)
[place] [facility] [place] NAME: [name]    MR#:  [id number]  DATE OF BIRTH:  1923/05/16  SUBJECTIVE:  daughter [name] present in the room patient is sleeping really hard. She received IV Ativan 0.5 mg around 6 AM. She was very confused had not slept last couple days according to the daughter. Patient has been able to eat or take any of her meds since she received her Ativan  REVIEW OF SYSTEMS:   Review of Systems  Unable to perform ROS: Mental status change   Tolerating Diet: Tolerating PT:   DRUG ALLERGIES:   Allergies  Allergen Reactions  . Adhesive [Tape] Itching    Reaction: itching  . Clindamycin/Lincomycin Other (See Comments)    Reaction: upset stomach   . Codeine Other (See Comments)  . Demerol [Meperidine] Nausea Only  . Erythromycin Other (See Comments)  . Morphine And Related     Reaction: unknown   . Other Nausea Only    Darvocet  . Penicillins Rash    Has patient had a PCN reaction causing immediate rash, facial/tongue/throat swelling, SOB or lightheadedness with hypotension: unknown Has patient had a PCN reaction causing severe rash involving mucus membranes or skin necrosis: unknown Has patient had a PCN reaction that required hospitalization: unknown  Has patient had a PCN reaction occurring within the last 10 years: unknown  If all of the above answers are "NO", then may proceed with Cephalosporin use.    . Sulfa Antibiotics Rash    VITALS:  Blood pressure 102/61, pulse 68, temperature 98 F (36.7 C), temperature source Oral, resp. rate 18, height _0  (1.6 m), weight 61.7 kg, SpO2 (!) 66 %.  PHYSICAL EXAMINATION:   Physical Exam limited  GENERAL:  86 y.o.-year-old patient lying in the bed with no acute distress. Weak frail  LUNGS: Normal breath sounds bilaterally, no wheezing, rales, rhonchi. No use of accessory muscles of respiration.  CARDIOVASCULAR: S1, S2 normal. No murmurs, rubs, or gallops.   ABDOMEN: Soft, nontender, nondistended. Bowel sounds present. No organomegaly or mass.  EXTREMITIES: No cyanosis, clubbing or edema b/l.    NEUROLOGIC: patient sedated unable to examine  PSYCHIATRIC:  patient is sleeping secondary to IV sedation meds SKIN: No obvious rash, lesion, or ulcer.   LABORATORY PANEL:  CBC Recent Labs  Lab 03/20/20 0306  WBC 10.6*  HGB 10.8*  HCT 33.8*  PLT 173    Chemistries  Recent Labs  Lab 03/17/20 0923 03/17/20 0923 03/18/20 0649  NA 137   < > 135  K 3.3*   < > 3.3*  CL 100   < > 105  CO2 26   < > 23  GLUCOSE 103*   < > 91  BUN 17   < > 13  CREATININE 1.02*   < > 0.75  CALCIUM 9.2   < > 8.1*  AST 20  --   --   ALT 10  --   --   ALKPHOS 81  --   --   BILITOT 1.8*  --   --    < > = values in this interval not displayed.   Cardiac Enzymes No results for input(s): TROPONINI in the last 168 hours. RADIOLOGY:  DG Chest 1 View  Result Date: 03/19/2020 CLINICAL DATA:  86 year old female with wheezing. EXAM: CHEST  1 VIEW COMPARISON:  Chest radiograph dated 03/17/2020. FINDINGS: There is cardiomegaly with mild vascular congestion and edema. There is trace left  and possibly right pleural effusion. No focal consolidation or pneumothorax. Atherosclerotic calcification of the aorta. Left pectoral pacemaker device. Osteopenia with degenerative changes of the spine and shoulders. No acute osseous pathology. IMPRESSION: Cardiomegaly with mild vascular congestion and edema. Electronically Signed   By: [name] M.D.   On: 03/19/2020 22:43   ASSESSMENT AND PLAN:   [name] a 86 y.o.femalewith medical history significant forhypertension who was brought into the ER by EMS for evaluation of fever, tachycardia and back pain. Patient was status of sustained a fall 2 days prior to her hospitalization and has complained of low back pain since then. Per nursing home staff she had a fever with a T-max of 102Fand was also tachycardic. Patient  complains of a sore throat with associated nausea but denies having any vomiting.   SIRS etio unclear -suspected due to Pharyngitis? viral syndrome, UC MRSA + -Patient was sent from the nursing home for evaluation of fever with a T-max of 102,tachypneaand sinus tachycardia. -low grade fever of 99--today afebrile -weak Follow-up blood cultures negative -UC MRSA-- Consulted ID-- this could be colonization. Since no symptoms of UTI and hemodynamically stable hold off on antibiotics -CT chest abdomen pelvis -- no acute infectious etiology found  -strep throat negative -COVID negative -continue supportive care PRN Tylenol on IV fluids  Failure to thrive, fall, gradual decline over six months -daughters were concerned about patient's overall decline. -Palliative care met [name] daughter and planning to meet for further discussion tomorrow 7/16/201 -daughter expressed interest and hospice at [facility].   Hypothyroidism ContinueSynthroid  acute renal failure likely due to poor PO intake -came in with creatinine of 1.02--- IV fluids-- 0.75 -looks clinically dry--not much po intake--increase IVF to 100 cc/hr  Persistent A. Fibwith rapid ventricular rate and h/o SSS -Patient EKG showed sinus tachycardia but repeat EKG done due topersistenttachycardia showedrapid atrial fibrillation -Continue metoprolol --Continue Eliquis as primary prophylaxis for an acute stroke  -Cardiology consult with dr Clayborn Bigness appreciated -increased BB to 37.5 mg bid-- heart rate much better  Hypokalemia Secondary to diuretic use Supplement potassium  PT to see pt  DVT prophylaxis:Eliquis Code Status:DNR at admission Family Communication:patient's daughterJune in the room Disposition Plan:Back to previous home environment--[facility] Consults called:None  Status is: Inpatient  Remains inpatient appropriate because:Ongoing diagnostic testing needed not appropriate for  outpatient work up   Dispo: The patient is from: SNF  Anticipated d/c is to: SNF  Anticipated d/c date is: 1-2 days  Patient currently is not medically stable to d/c. Palliative care consultation. Family to meet with them tomorrow TOC for discharge planning  TOTAL TIME TAKING CARE OF THIS PATIENT: *25* minutes.  >50% time spent on counselling and coordination of care  Note: This dictation was prepared with Dragon dictation along with smaller phrase technology. Any transcriptional errors that result from this process are unintentional.  Fritzi Mandes M.D    [facility]   CC: Primary care physician; Idelle Crouch, MDPatient ID: [name], female   DOB: [birth year], 86 y.o.   MRN: [id number]

## 2020-03-20 NOTE — Care Management Important Message (Signed)
Important Message  Patient Details  Name: [name] MRN: [id number] Date of Birth: [birth year]   Medicare Important Message Given:  Yes  Explained to daughter in room   Neale Burly 03/20/2020, 11:32 AM

## 2020-03-20 NOTE — Consult Note (Signed)
Consultation Note Date: 03/20/2020   Patient Name: [name]  DOB: [birth year]  MRN: [id number]  Age / Sex: 86 y.o., female  PCP: Ann Crouch, MD Referring Physician: Fritzi Mandes, MD  Reason for Consultation: Establishing goals of care  HPI/Patient Profile: 86 y.o. female  with past medical history of HTN, CAD, pacemaker for SSS, squamous cell carcinoma, COPD, GERD, thyroid disease, and CVA admitted on 03/17/2020 with fever of 102 - possible viral syndrome. Urine culture positive for MRSA but thought to be contaminant. Patient has had persistent a fib with RVR. PMT consulted to discuss goals of care.  Clinical Assessment and Goals of Care: I have reviewed medical records including EPIC notes, labs and imaging, received report from [name], assessed the patient and then met with patient's daughter [name]  to discuss diagnosis prognosis, [place], EOL wishes, disposition and options.  I introduced Palliative Medicine as specialized medical care for people living with serious illness. It focuses on providing relief from the symptoms and stress of a serious illness. The goal is to improve quality of life for both the patient and the family.  [name] would like all of patient's daughter to be present for meeting; however, they are unavailable today so we agreed to briefly discuss goals of care and follow up tomorrow when her sisters can be present. [name] shares that patient has 4 daughters; however, the oldest daughter has dementia so [name] does not plan to include her in family meetings as she feels it would overwhelm her.   [name] shares that patient has been living at home until very recently - she has had caregivers in the home since 2008, they became 24/7 caregivers in 2013. The family decided to place patient in long-term care at [facility] due to increased needs - this began July 9th of this year. Patient fell the next day after placement.   As far as  functional and nutritional status, [name] speaks of a significant decline over the past 4-5 months. The patient can ambulate with a walker but has become much weaker and needs significant amount of assistance with ambulation. She has also had a decreased appetite. [name] tells me of cognitive decline as well - sometimes does not recognize her daughters.    We discussed patient's current illness and what it means in the larger context of patient's on-going co-morbidities.  Natural disease trajectory and expectations at EOL were discussed. We discussed that patient is declining in functional status, nutritional status, and cognitive status and this leads me to believe that [name] is nearing end of life.   I attempted to elicit values and goals of care important to the patient.  [name] shares patient has an advance directive and has indicated she would not want extreme measures at the end of life. She would want to focus on comfort and quality of life.   We discussed a MOST form - this was not completed yet as [name] would like to discuss it with her sisters as well - [name] indicates that they will most likely be interested in selecting the option for comfort measures only with no further hospitalizations unless comfort cannot be achieved at [facility]. We also discussed selecting DNR, determine use  of antibiotics when infection occurs, IV fluids for trial, and NO feeding tube.    Discussed with [name] the importance of continued conversation with family and the medical providers regarding overall plan of care and treatment options, ensuring decisions are within the context of the patients values and GOCs.    Hospice services outpatient were explained and offered. [name] is interested in hospice care at [facility] and is interested in hearing more about what this would entail. Social work [name] has reached out to [facility] and asked them to discuss with family further.   Planned to follow up with other  family members at 2:30 tomorrow.  Questions and concerns were addressed. The family was encouraged to call with questions or concerns.   Primary Decision Maker HCPOA - daughter June    SUMMARY [place]   - will f/u for family meeting with 3 daughters 7/16 at 2:30 - discussed MOST - family interested in selection of comfort measures only with no further hospitalization but will finalize in meeting tomorrow - family also interested in possible hospice care at [facility] - will discuss further in 7/16 meeting  Code Status/Advance Care Planning:  DNR   Symptom Management:   Patient sedated during my visit follow ativan administration this AM, no s/s of distress  Prognosis:   < 6 months  Discharge Planning: [place] with Hospice      Primary Diagnoses: Present on Admission:  Hypothyroidism  CAD (coronary artery disease)  GERD (gastroesophageal reflux disease)  Pharyngitis with viral syndrome  Acute viral syndrome  AF (paroxysmal atrial fibrillation) (HCC)  Paroxysmal atrial fibrillation with rapid ventricular response ([place])  SIRS (systemic inflammatory response syndrome) ([place])   I have reviewed the medical record, interviewed the patient and family, and examined the patient. The following aspects are pertinent.  Past Medical History:  Diagnosis Date   Arthritis    Blood clot in vein    CAD (coronary artery disease)    Cancer (HCC)    squamous cell carcinoma   COPD (chronic obstructive pulmonary disease) (HCC)    Diverticulitis    DVT (deep vein thrombosis) in pregnancy    GERD (gastroesophageal reflux disease)    Myocardial infarction [facility])    Pacemaker    PE (pulmonary thromboembolism) ([place])    Scoliosis    Stroke ([place])    Thyroid disease    TIA (transient ischemic attack)    Social History   Socioeconomic History   Marital status: Widowed    Spouse name: Not on file   Number of children: Not on  file   Years of education: Not on file   Highest education level: Not on file  Occupational History   Not on file  Tobacco Use   Smoking status: Never Smoker   Smokeless tobacco: Never Used  Vaping Use   Vaping Use: Never used  Substance and Sexual Activity   Alcohol use: No   Drug use: No   Sexual activity: Not on file  Other Topics Concern   Not on file  Social History Narrative   Not on file   Social Determinants of Health   Financial Resource Strain:    Difficulty of Paying Living Expenses:   Food Insecurity:    Worried About [place] in the Last Year:    Human resources officer of Food in the Last Year:   Transportation Needs:    Film/video editor (Medical):    Lack of Transportation (Non-Medical):  Physical Activity:    Days of Exercise per Week:    Minutes of Exercise per Session:   Stress:    Feeling of Stress :   Social Connections:    Frequency of Communication with Friends and Family:    Frequency of Social Gatherings with Friends and Family:    Attends Religious Services:    Active Member of Clubs or Organizations:    Attends Music therapist:    Marital Status:    Family History  Problem Relation Age of Onset   Heart disease Mother    Heart attack Mother    Scheduled Meds:  acetaminophen (TYLENOL) oral liquid 160 mg/5 mL  650 mg Oral Once   apixaban  2.5 mg Oral BID   aspirin EC  81 mg Oral Daily   Chlorhexidine Gluconate Cloth  6 each Topical Q0600   levothyroxine  50 mcg Oral QAC breakfast   magic mouthwash  5 mL Oral TID   And   lidocaine  5 mL Mouth/Throat TID   melatonin  5 mg Oral QHS   metoprolol tartrate  37.5 mg Oral BID   mupirocin ointment  1 application Nasal BID   polyvinyl alcohol  1 drop Both Eyes TID   potassium chloride  10 mEq Oral Daily   potassium chloride  40 mEq Oral Once   predniSONE  5 mg Oral Q breakfast   senna  1 tablet Oral BID   traMADol  50 mg Oral QID     vitamin B-12  1,000 mcg Oral Daily   Continuous Infusions: PRN Meds:.fluticasone, iopamidol, ipratropium-albuterol, loratadine, ondansetron **OR** ondansetron (ZOFRAN) IV, phenol-menthol, polyethylene glycol, polyvinyl alcohol Allergies  Allergen Reactions   Adhesive [Tape] Itching    Reaction: itching   Clindamycin/Lincomycin Other (See Comments)    Reaction: upset stomach    Codeine Other (See Comments)   Demerol [Meperidine] Nausea Only   Erythromycin Other (See Comments)   Morphine And Related     Reaction: unknown    Other Nausea Only    Darvocet   Penicillins Rash    Has patient had a PCN reaction causing immediate rash, facial/tongue/throat swelling, SOB or lightheadedness with hypotension: unknown Has patient had a PCN reaction causing severe rash involving mucus membranes or skin necrosis: unknown Has patient had a PCN reaction that required hospitalization: unknown  Has patient had a PCN reaction occurring within the last 10 years: unknown  If all of the above answers are "NO", then may proceed with Cephalosporin use.     Sulfa Antibiotics Rash   Review of Systems  Unable to perform ROS: Mental status change    Physical Exam Constitutional:      Comments: Sedated, non-verbal  Pulmonary:     Effort: Pulmonary effort is normal.  Neurological:     Comments: UTA     Vital Signs: BP 102/61 (BP Location: Left Arm)    Pulse (!) 48    Temp 98 F (36.7 C) (Oral)    Resp (!) 22    Ht 5' 3" (1.6 m)    Wt 61.7 kg    SpO2 (!) 66%    BMI 24.10 kg/m  Pain Scale: 0-10   Pain Score: Asleep   SpO2: SpO2: (!) 66 % O2 Device:SpO2: (!) 66 % O2 Flow Rate: .   IO: Intake/output summary:   Intake/Output Summary (Last 24 hours) at 03/20/2020 1330 Last data filed at 03/20/2020 0500 Gross per 24 hour  Intake --  Output 4 ml  Net -4 ml    LBM: Last BM Date: 03/19/20 Baseline Weight: Weight: 61.7 kg Most recent weight: Weight: 61.7 kg     Palliative  Assessment/Data: PPS 20%     Time Total: 70 minutes Greater than 50%  of this time was spent counseling and coordinating care related to the above assessment and plan.  Juel Burrow, DNP, AGNP-C Palliative Medicine Team [phone] Pager: [phone]

## 2020-03-20 NOTE — Progress Notes (Signed)
PT Cancellation Note  Patient Details Name: [name] MRN: [id number] DOB: 12/20/1922   Cancelled Treatment:    Reason Eval/Treat Not Completed: Fatigue/lethargy limiting ability to participate (Consult received and chart reviewed. Patient sleeping soundly (s/p administration of ativan this AM); unable to participate with PT at this time.  Will continue to follow and initiate as appropriate.)   Loni Abdon H. Owens Shark, PT, DPT, NCS 03/20/20, 10:16 AM [phone]

## 2020-03-20 NOTE — Progress Notes (Signed)
[facility] Cardiology    SUBJECTIVE: Patient resting comfortably asleep patient was sedated because of agitation insomnia last evening.  Daughter was in the room at the time no reported fevers   Vitals:   03/19/20 1550 03/19/20 2046 03/19/20 2128 03/20/20 0626  BP: (!) 138/92 (!) 140/97 (!) 130/91 100/65  Pulse: 67 (!) 133 80 85  Resp: 20 20 20    Temp: 98.7 F (37.1 C) (!) 97.5 F (36.4 C) (!) 97.5 F (36.4 C) (!) 97.5 F (36.4 C)  TempSrc: Oral Oral Oral   SpO2: 98% 95% 99% 98%  Weight:      Height:         Intake/Output Summary (Last 24 hours) at 03/20/2020 0948 Last data filed at 03/20/2020 0500 Gross per 24 hour  Intake --  Output 4 ml  Net -4 ml      PHYSICAL EXAM  General: Well developed, well nourished, in no acute distress HEENT:  Normocephalic and atramatic Neck:  No JVD.  Lungs: Clear bilaterally to auscultation and percussion. Heart: HRRR . Normal S1 and S2 without gallops or murmurs.  Abdomen: Bowel sounds are positive, abdomen soft and non-tender  Msk:  Back normal, normal gait. Normal strength and tone for age. Extremities: No clubbing, cyanosis or edema.   Neuro: Alert and oriented X 3. Psych:  Good affect, responds appropriately   LABS: Basic Metabolic Panel: Recent Labs    03/18/20 0649  NA 135  K 3.3*  CL 105  CO2 23  GLUCOSE 91  BUN 13  CREATININE 0.75  CALCIUM 8.1*   Liver Function Tests: No results for input(s): AST, ALT, ALKPHOS, BILITOT, PROT, ALBUMIN in the last 72 hours. No results for input(s): LIPASE, AMYLASE in the last 72 hours. CBC: Recent Labs    03/18/20 0649 03/20/20 0306  WBC 8.0 10.6*  HGB 9.8* 10.8*  HCT 32.0* 33.8*  MCV 88.2 84.7  PLT 118* 173   Cardiac Enzymes: Recent Labs    03/19/20 1256  CKTOTAL 97   BNP: Invalid input(s): POCBNP D-Dimer: No results for input(s): DDIMER in the last 72 hours. Hemoglobin A1C: No results for input(s): HGBA1C in the last 72 hours. Fasting Lipid Panel: No results for  input(s): CHOL, HDL, LDLCALC, TRIG, CHOLHDL, LDLDIRECT in the last 72 hours. Thyroid Function Tests: No results for input(s): TSH, T4TOTAL, T3FREE, THYROIDAB in the last 72 hours.  Invalid input(s): FREET3 Anemia Panel: No results for input(s): VITAMINB12, FOLATE, FERRITIN, TIBC, IRON, RETICCTPCT in the last 72 hours.  DG Chest 1 View  Result Date: 03/19/2020 CLINICAL DATA:  86 year old female with wheezing. EXAM: CHEST  1 VIEW COMPARISON:  Chest radiograph dated 03/17/2020. FINDINGS: There is cardiomegaly with mild vascular congestion and edema. There is trace left and possibly right pleural effusion. No focal consolidation or pneumothorax. Atherosclerotic calcification of the aorta. Left pectoral pacemaker device. Osteopenia with degenerative changes of the spine and shoulders. No acute osseous pathology. IMPRESSION: Cardiomegaly with mild vascular congestion and edema. Electronically Signed   By: Anner Crete M.D.   On: 03/19/2020 22:43     Echo previous echocardiogram 2018 showed preserved left ventricular function 60%  TELEMETRY: Atrial fibrillation rate controlled 80  ASSESSMENT AND PLAN:  Principal Problem:   Pharyngitis with viral syndrome Active Problems:   CAD (coronary artery disease)   Hypothyroidism   GERD (gastroesophageal reflux disease)   Acute viral syndrome   AF (paroxysmal atrial fibrillation) (HCC)   Paroxysmal atrial fibrillation with rapid ventricular response ([place])   SIRS (  systemic inflammatory response syndrome) (HCC)    Plan Continue conservative cardiac input Maintain rate control for rapid ventricular response- Recommend continue long-term anticoagulation with Eliquis for A. fib Continue reflux therapy as necessary Consider broad-spectrum antibiotic therapy for possible infection Inhalers as necessary for congestion and bronchitis Would recommend discontinuing aspirin while the patient is on Eliquis Recommend conservative therapy at this  point   Yolonda Kida, MD 03/20/2020 9:48 AM

## 2020-03-21 DIAGNOSIS — Z515 Encounter for palliative care: Secondary | ICD-10-CM | POA: Diagnosis not present

## 2020-03-21 DIAGNOSIS — R6510 Systemic inflammatory response syndrome (SIRS) of non-infectious origin without acute organ dysfunction: Secondary | ICD-10-CM | POA: Diagnosis not present

## 2020-03-21 DIAGNOSIS — B349 Viral infection, unspecified: Secondary | ICD-10-CM | POA: Diagnosis not present

## 2020-03-21 DIAGNOSIS — J029 Acute pharyngitis, unspecified: Secondary | ICD-10-CM | POA: Diagnosis not present

## 2020-03-21 DIAGNOSIS — Z7189 Other specified counseling: Secondary | ICD-10-CM | POA: Diagnosis not present

## 2020-03-21 DIAGNOSIS — I480 Paroxysmal atrial fibrillation: Secondary | ICD-10-CM | POA: Diagnosis not present

## 2020-03-21 MED ORDER — DEXTROSE-NACL 5-0.9 % IV SOLN: INTRAVENOUS | Status: DC

## 2020-03-21 MED ORDER — METOPROLOL TARTRATE 25 MG PO TABS
25.0000 mg | ORAL_TABLET | Freq: Two times a day (BID) | ORAL | Status: DC
  Administered 2020-03-21 – 2020-03-24 (×6): 25 mg via ORAL
  Filled 2020-03-21 (×6): qty 1

## 2020-03-21 MED ORDER — METOPROLOL TARTRATE 5 MG/5ML IV SOLN
5.0000 mg | INTRAVENOUS | Status: DC | PRN
  Administered 2020-03-21: 5 mg via INTRAVENOUS
  Filled 2020-03-21: qty 5

## 2020-03-21 MED ORDER — METOPROLOL TARTRATE 5 MG/5ML IV SOLN
5.0000 mg | Freq: Four times a day (QID) | INTRAVENOUS | Status: DC
  Administered 2020-03-21 – 2020-03-23 (×4): 5 mg via INTRAVENOUS
  Filled 2020-03-21 (×4): qty 5

## 2020-03-21 NOTE — TOC Progression Note (Signed)
Transition of Care [facility]) - Progression Note    Patient Details  Name: [name] MRN: [id number] Date of Birth: 1923-06-26  Transition of Care [facility] CM/SW Contact  Emilygrace Grothe, Gardiner Rhyme, LCSW Phone Number: 03/21/2020, 3:37 PM  Clinical Narrative: Met with all three daughter's discussing hospice services in [place] versus hospice home. Will see how weekend goes and go from there. See on Monday regarding plan.     Expected Discharge Plan: Long Term Nursing Home Barriers to Discharge: Continued Medical Work up  Expected Discharge Plan and Services Expected Discharge Plan: [place] In-house Referral: Clinical Social Work   Post Acute Care Choice: Nursing Home Living arrangements for the past 2 months: [place]                                       Social Determinants of Health (SDOH) Interventions    Readmission Risk Interventions No flowsheet data found.

## 2020-03-21 NOTE — Progress Notes (Signed)
PT Cancellation Note  Patient Details Name: [name] MRN: [id number] DOB: [birth year]   Cancelled Treatment:    Reason Eval/Treat Not Completed:  (Chart reviewed for re-attempt at evaluation.  Per palliative care note, patient/family considering transition to comfort care and return to facility with hospice services.  Follow up meeting scheduled for this date.  Will hold PT eval until meeting complete and goals of care firmly established.)   Alleyah Twombly H. Owens Shark, PT, DPT, NCS 03/21/20, 2:03 PM [phone]

## 2020-03-21 NOTE — Progress Notes (Signed)
[facility] Cardiology    SUBJECTIVE: Patient still sleeping but arousable still tachycardic irregular heart rate with the patient still essentially unaware of it   Vitals:   03/21/20 0003 03/21/20 0453 03/21/20 0659 03/21/20 0757  BP: (!) 148/89 136/88  (!) 144/89  Pulse: (!) 144 (!) 142 (!) 124 (!) 131  Resp: 16 16  18   Temp: 97.7 F (36.5 C) (!) 97.5 F (36.4 C)  98.8 F (37.1 C)  TempSrc: Oral Oral  Oral  SpO2: 91% 95%  98%  Weight:      Height:         Intake/Output Summary (Last 24 hours) at 03/21/2020 0858 Last data filed at 03/21/2020 0504 Gross per 24 hour  Intake --  Output 650 ml  Net -650 ml      PHYSICAL EXAM  General: Well developed, well nourished, in no acute distress HEENT:  Normocephalic and atramatic Neck:  No JVD.  Lungs: Clear bilaterally to auscultation and percussion. Heart: Irregular irregular tachycardic normal S1 and S2 without gallops or murmurs.  Abdomen: Bowel sounds are positive, abdomen soft and non-tender  Msk:  Back normal, normal gait. Normal strength and tone for age. Extremities: No clubbing, cyanosis or edema.   Neuro: Alert and oriented X 3. Psych:  Good affect, responds appropriately   LABS: Basic Metabolic Panel: No results for input(s): NA, K, CL, CO2, GLUCOSE, BUN, CREATININE, CALCIUM, MG, PHOS in the last 72 hours. Liver Function Tests: No results for input(s): AST, ALT, ALKPHOS, BILITOT, PROT, ALBUMIN in the last 72 hours. No results for input(s): LIPASE, AMYLASE in the last 72 hours. CBC: Recent Labs    03/20/20 0306  WBC 10.6*  HGB 10.8*  HCT 33.8*  MCV 84.7  PLT 173   Cardiac Enzymes: Recent Labs    03/19/20 1256  CKTOTAL 97   BNP: Invalid input(s): POCBNP D-Dimer: No results for input(s): DDIMER in the last 72 hours. Hemoglobin A1C: No results for input(s): HGBA1C in the last 72 hours. Fasting Lipid Panel: No results for input(s): CHOL, HDL, LDLCALC, TRIG, CHOLHDL, LDLDIRECT in the last 72 hours. Thyroid  Function Tests: No results for input(s): TSH, T4TOTAL, T3FREE, THYROIDAB in the last 72 hours.  Invalid input(s): FREET3 Anemia Panel: No results for input(s): VITAMINB12, FOLATE, FERRITIN, TIBC, IRON, RETICCTPCT in the last 72 hours.  DG Chest 1 View  Result Date: 03/19/2020 CLINICAL DATA:  86 year old female with wheezing. EXAM: CHEST  1 VIEW COMPARISON:  Chest radiograph dated 03/17/2020. FINDINGS: There is cardiomegaly with mild vascular congestion and edema. There is trace left and possibly right pleural effusion. No focal consolidation or pneumothorax. Atherosclerotic calcification of the aorta. Left pectoral pacemaker device. Osteopenia with degenerative changes of the spine and shoulders. No acute osseous pathology. IMPRESSION: Cardiomegaly with mild vascular congestion and edema. Electronically Signed   By: Anner Crete M.D.   On: 03/19/2020 22:43     Echo normal overall left ventricular function ejection fraction of 60% 2018  TELEMETRY: Atrial fibrillation rapid ventricular response then at times patient bradycardic:  ASSESSMENT AND PLAN:  Principal Problem:   Pharyngitis with viral syndrome Active Problems:   CAD (coronary artery disease)   Hypothyroidism   GERD (gastroesophageal reflux disease)   Acute viral syndrome   AF (paroxysmal atrial fibrillation) (HCC)   Paroxysmal atrial fibrillation with rapid ventricular response (HCC)   SIRS (systemic inflammatory response syndrome) ([place])   Goals of care, counseling/discussion   Palliative care by specialist   DNR (do not resuscitate)  Plan Patient had trouble with rapid atrial fibrillation she is responded to IV metoprolol will consider adding low-dose metoprolol tartrate twice a day in addition to IV intermittent as needed doses Patient had bradycardic episodes as well which makes adding long acting metoprolol concerning but her heart rate been too fast so we will add maybe 25 twice a day in addition to IV  metoprolol Continue Eliquis for anticoagulation Agree with reflux type symptoms Agree with palliative care at this point Continue supportive care   Yolonda Kida, MD 03/21/2020 8:58 AM

## 2020-03-21 NOTE — Progress Notes (Signed)
ID Pt frail lethargic But more responsive today- talking  Had some ensure  Patient Vitals for the past 24 hrs:  BP Temp Temp src Pulse Resp SpO2  03/21/20 1300 124/63 97.7 F (36.5 C) Axillary 67 17 96 %  03/21/20 1037 128/84 98.5 F (36.9 C) Oral (!) 135 17 95 %  03/21/20 0757 (!) 144/89 98.8 F (37.1 C) Oral (!) 131 18 98 %  03/21/20 0659 -- -- -- (!) 124 -- --  03/21/20 0453 136/88 (!) 97.5 F (36.4 C) Oral (!) 142 16 95 %  03/21/20 0003 (!) 148/89 97.7 F (36.5 C) Oral (!) 144 16 91 %  03/20/20 2031 138/69 98.3 F (36.8 C) Oral (!) 108 18 90 %  03/20/20 1538 106/63 97.8 F (36.6 C) Oral 82 (!) 24 99 %    O/e lethargic HS irregular Chest b/l air entry neuro- moves all limbs  labs  CBC Latest Ref Rng & Units 03/20/2020 03/18/2020 03/17/2020  WBC 4.0 - 10.5 K/uL 10.6(H) 8.0 8.9  Hemoglobin 12.0 - 15.0 g/dL 10.8(L) 9.8(L) 11.7(L)  Hematocrit 36 - 46 % 33.8(L) 32.0(L) 38.0  Platelets 150 - 400 K/uL 173 118(L) 176     CMP Latest Ref Rng & Units 03/18/2020 03/17/2020 12/10/2017  Glucose 70 - 99 mg/dL 91 103(H) 84  BUN 8 - 23 mg/dL 13 17 19   Creatinine 0.44 - 1.00 mg/dL 0.75 1.02(H) 1.05(H)  Sodium 135 - 145 mmol/L 135 137 141  Potassium 3.5 - 5.1 mmol/L 3.3(L) 3.3(L) 3.8  Chloride 98 - 111 mmol/L 105 100 106  CO2 22 - 32 mmol/L 23 26 27   Calcium 8.9 - 10.3 mg/dL 8.1(L) 9.2 9.0  Total Protein 6.5 - 8.1 g/dL - 6.9 -  Total Bilirubin 0.3 - 1.2 mg/dL - 1.8(H) -  Alkaline Phos 38 - 126 U/L - 81 -  AST 15 - 41 U/L - 20 -  ALT 0 - 44 U/L - 10 -    U/A no wbc  BC- NG UC MRSA   Impression/recommendation 86 year old female presents from the nursing home with fever and tachycardia but has been afebrile since admission to the hospital. She is not currently on any antibiotics. She is her altered mental status. She was dehydrated on admission. The labs revealed negative strep PCR throat, blood cultures so far negative, urine culture has MRSA and her nares is also colonized  with MRSA.  MRSA in the urine: usually MRSA does not cause UTI- if seen in the urine it is usually a spill over from blood or a skin contaminant  The blood culture has been negative. ?So will observe patient without antibiotics. If her temperature increases or leukocytosis or her condition worsens then we need to repeat blood cultures and may have to start vancomycin.  A. fib: Poorly controlled currently Was on low-dose metoprolol. Has been seen by cardiology. Has a pacemaker  Encephalopathy: Combination of underlying neurocognitive impairment with dehydration.Need to observe closely for infection that can become obvious  History of fall. No fracture of the lumbar spine.  History of MRSA pneumonia in 2013.  Discussed with daughters in great detail.

## 2020-03-21 NOTE — Progress Notes (Signed)
[facility] [place] NAME: [name]    MR#:  [id number]  DATE OF BIRTH:  09/01/1923  SUBJECTIVE:  Resting quietly Issues with swallowing this am per RN  REVIEW OF SYSTEMS:   Review of Systems  Unable to perform ROS: Mental status change   Tolerating Diet: Tolerating PT:   DRUG ALLERGIES:   Allergies  Allergen Reactions  . Adhesive [Tape] Itching    Reaction: itching  . Clindamycin/Lincomycin Other (See Comments)    Reaction: upset stomach   . Codeine Other (See Comments)  . Demerol [Meperidine] Nausea Only  . Erythromycin Other (See Comments)  . Morphine And Related     Reaction: unknown   . Other Nausea Only    Darvocet  . Penicillins Rash    Has patient had a PCN reaction causing immediate rash, facial/tongue/throat swelling, SOB or lightheadedness with hypotension: unknown Has patient had a PCN reaction causing severe rash involving mucus membranes or skin necrosis: unknown Has patient had a PCN reaction that required hospitalization: unknown  Has patient had a PCN reaction occurring within the last 10 years: unknown  If all of the above answers are "NO", then may proceed with Cephalosporin use.    . Sulfa Antibiotics Rash    VITALS:  Blood pressure 124/63, pulse 67, temperature 97.7 F (36.5 C), temperature source Axillary, resp. rate 17, height 5' 3"  (1.6 m), weight 61.7 kg, SpO2 96 %.  PHYSICAL EXAMINATION:   Physical Exam limited  GENERAL:  86 y.o.-year-old patient lying in the bed with no acute distress. Weak frail  LUNGS: Normal breath sounds bilaterally, no wheezing, rales, rhonchi. No use of accessory muscles of respiration.  CARDIOVASCULAR: S1, S2 normal. No murmurs, rubs, or gallops.  ABDOMEN: Soft, nontender, nondistended. Bowel sounds present. No organomegaly or mass.  EXTREMITIES: No cyanosis, clubbing or edema b/l.    NEUROLOGIC: patient sedated unable to examine  PSYCHIATRIC:  patient is  sleeping secondary to IV sedation meds SKIN: No obvious rash, lesion, or ulcer.   LABORATORY PANEL:  CBC Recent Labs  Lab 03/20/20 0306  WBC 10.6*  HGB 10.8*  HCT 33.8*  PLT 173    Chemistries  Recent Labs  Lab 03/17/20 0923 03/17/20 0923 03/18/20 0649  NA 137   < > 135  K 3.3*   < > 3.3*  CL 100   < > 105  CO2 26   < > 23  GLUCOSE 103*   < > 91  BUN 17   < > 13  CREATININE 1.02*   < > 0.75  CALCIUM 9.2   < > 8.1*  AST 20  --   --   ALT 10  --   --   ALKPHOS 81  --   --   BILITOT 1.8*  --   --    < > = values in this interval not displayed.   Cardiac Enzymes No results for input(s): TROPONINI in the last 168 hours. RADIOLOGY:  DG Chest 1 View  Result Date: 03/19/2020 CLINICAL DATA:  86 year old female with wheezing. EXAM: CHEST  1 VIEW COMPARISON:  Chest radiograph dated 03/17/2020. FINDINGS: There is cardiomegaly with mild vascular congestion and edema. There is trace left and possibly right pleural effusion. No focal consolidation or pneumothorax. Atherosclerotic calcification of the aorta. Left pectoral pacemaker device. Osteopenia with degenerative changes of the spine and shoulders. No acute osseous pathology. IMPRESSION: Cardiomegaly with mild vascular congestion and edema. Electronically Signed  By: Anner Crete M.D.   On: 03/19/2020 22:43   ASSESSMENT AND PLAN:   [name] a 86 y.o.femalewith medical history significant forhypertension who was brought into the ER by EMS for evaluation of fever, tachycardia and back pain. Patient was status of sustained a fall 2 days prior to her hospitalization and has complained of low back pain since then. Per nursing home staff she had a fever with a T-max of 102Fand was also tachycardic. Patient complains of a sore throat with associated nausea but denies having any vomiting.   SIRS etio unclear -suspected due to Pharyngitis? viral syndrome, UC MRSA + -Patient was sent from the nursing home for evaluation  of fever with a T-max of 102,tachypneaand sinus tachycardia. -low grade fever of 99--now remains afebrile -Follow-up blood cultures negative -UC MRSA-- Consulted ID-- this could be colonization. Since no symptoms of UTI and hemodynamically stable hold off on antibiotics -CT chest abdomen pelvis -- no acute infectious etiology found  -strep throat negative -COVID negative -continue supportive care PRN Tylenol on IV fluids  Failure to thrive, fall, gradual decline over six months -daughters were concerned about patient's overall decline. -Palliative care met with [name] -- plan for keeping patient over the weekend to see if she heard PO intake improves and she participates with physical therapy. Patient remains DNR. If she continues to decline further family is agreeable with hospice at [facility] -per palliative care conversation patient has had hospice before -speech therapy to see patient for difficulty swallowing  Hypothyroidism ContinueSynthroid  acute renal failure likely due to poor PO intake -came in with creatinine of 1.02--- IV fluids-- 0.75 -looks clinically dry--not much po intake--increase IVF to 100 cc/hr  Persistent A. Fibwith rapid ventricular rate and h/o SSS -Patient EKG showed sinus tachycardia but repeat EKG done due topersistenttachycardia showedrapid atrial fibrillation -Continue metoprolol --Continue Eliquis as primary prophylaxis for an acute stroke  -Cardiology consult with dr Clayborn Bigness appreciated -increased BB to 37.5 mg bid-- heart rate much better  Hypokalemia Secondary to diuretic use Supplement potassium  PT to see pt--unable to participate  DVT prophylaxis:Eliquis Code Status:DNR at admission Family Communication:patient's daughterJune in the room Disposition Plan:Back to previous home environment--[facility] Consults called:None  Status is: Inpatient  Remains inpatient appropriate because:Ongoing diagnostic testing  needed not appropriate for outpatient work up   Dispo: The patient is from: SNF  Anticipated d/c is to: SNF  Anticipated d/c date is: 1-2 days  Patient currently is not medically stable to d/c. Palliative care consultation. Family to met --wants to give some time atleast weekend to see if her po intake improve  TOC for discharge planning   TOTAL TIME TAKING CARE OF THIS PATIENT: *25* minutes.  >50% time spent on counselling and coordination of care  Note: This dictation was prepared with Dragon dictation along with smaller phrase technology. Any transcriptional errors that result from this process are unintentional.  Fritzi Mandes M.D    [facility]   CC: Primary care physician; Idelle Crouch, MDPatient ID: [name], female   DOB: [birth year], 86 y.o.   MRN: [id number]

## 2020-03-21 NOTE — Progress Notes (Signed)
Notified nurse of mews

## 2020-03-21 NOTE — Progress Notes (Signed)
Daily Progress Note   Patient Name: [name]       Date: 03/21/2020 DOB: August 26, 1923  Age: 86 y.o. MRN#: [id number] Attending Physician: Fritzi Mandes, MD Primary Care Physician: Idelle Crouch, MD Admit Date: 03/17/2020  Reason for Consultation/Follow-up: Establishing goals of care  Subjective: Patient is resting in bed with eyes closed. Patient opens her eyes and tries to speak but speech is not loud enough to understand. Daughter June who is HPOA is at bedside. She discusses that her mother has lived in her home for 72 years. She states there are 4 children, but one is cognitively unable to participate in decision making.   She tells me Ms. Taffe has had a caregiver full time for the past 8 years, and had a part time caregiver for 6 years prior to that.  She states over the past 4 months she has shown decline. She would have days of being completely lucid and tending to her flowers, and days where she was confused and agitated, not recognizing her children. June states the lucid days outnumber the bad days, and on good days her QOL was good.    They state due to the monthly cost of caregivers and financial planning, they moved her to [facility]. She was there less than a week before being admitted here. Daughter states this has all happened so quickly and they did not have a chance to see how she would do at [facility]. She does voice concern that this past Friday, the visitor restrictions at the facility were more limiting than they were 2 days prior to that on the Wednesday. They are concerned with these restrictions, as it was one of the reasons they moved her there, was so they could visit and spend time with her, and she would be able to move about the premises freely.   She  discusses that in the past, her mother was under hospice care for a full year before hospice discontinued their services. She states her mother has bounced back many times and they are unsure about how she will do this time.   June states she feels she and her sisters have felt pressured to make decisions, and need more time to talk themselves, and to see how she does. She states she does not feel they would  want a feeding tube for her. June states if her mother's oral intake does not improve over the weekend, she believes they would want to shift to comfort and perhaps move to the hospice facility. If she improves her oral intake, they would want to continue to treat the treatable, and when ready, return her to her facility to see how she does, and have palliative follow there for further discussions.      She states she and her sisters will have to discuss all of these decisions themselves.   Length of Stay: 4  Current Medications: Scheduled Meds:  . acetaminophen (TYLENOL) oral liquid 160 mg/5 mL  650 mg Oral Once  . apixaban  2.5 mg Oral BID  . Chlorhexidine Gluconate Cloth  6 each Topical Q0600  . levothyroxine  50 mcg Oral QAC breakfast  . magic mouthwash  5 mL Oral TID   And  . lidocaine  5 mL Mouth/Throat TID  . melatonin  5 mg Oral QHS  . metoprolol tartrate  5 mg Intravenous Q6H  . mupirocin ointment  1 application Nasal BID  . polyvinyl alcohol  1 drop Both Eyes TID  . potassium chloride  10 mEq Oral Daily  . potassium chloride  40 mEq Oral Once  . predniSONE  5 mg Oral Q breakfast  . senna  1 tablet Oral BID  . traMADol  50 mg Oral QID  . vitamin B-12  1,000 mcg Oral Daily    Continuous Infusions: . dextrose 5 % and 0.9% NaCl      PRN Meds: fluticasone, iopamidol, ipratropium-albuterol, ketorolac, loratadine, ondansetron **OR** ondansetron (ZOFRAN) IV, phenol-menthol, polyethylene glycol, polyvinyl alcohol  Physical Exam Constitutional:      Comments: Opens eyes for  a few minutes to try to speak.   Pulmonary:     Effort: Pulmonary effort is normal.             Vital Signs: BP 128/84 (BP Location: Right Arm)   Pulse (!) 135   Temp 98.5 F (36.9 C) (Oral)   Resp 17   Ht 5\' 3"  (1.6 m)   Wt 61.7 kg   SpO2 95%   BMI 24.10 kg/m  SpO2: SpO2: 95 % O2 Device: O2 Device: Room Air O2 Flow Rate:    Intake/output summary:   Intake/Output Summary (Last 24 hours) at 03/21/2020 1311 Last data filed at 03/21/2020 0504 Gross per 24 hour  Intake --  Output 650 ml  Net -650 ml   LBM: Last BM Date: 03/19/20 Baseline Weight: Weight: 61.7 kg Most recent weight: Weight: 61.7 kg       Palliative Assessment/Data:    Flowsheet Rows     Most Recent Value  Intake Tab  Referral Department Hospitalist  Unit at Time of Referral Med/Surg Unit  Palliative Care Primary Diagnosis Sepsis/Infectious Disease  Date Notified 03/19/20  Palliative Care Type New Palliative care  Reason for referral Clarify Goals of Care  Date of Admission 03/17/20  Date first seen by Palliative Care 03/20/20  # of days Palliative referral response time 1 Day(s)  # of days IP prior to Palliative referral 2  Clinical Assessment  Palliative Performance Scale Score 20%  Psychosocial & Spiritual Assessment  Palliative Care Outcomes  Patient/Family meeting held? Yes  Who was at the meeting? daughter  Samson goals of care, Counseled regarding hospice, Provided end of life care assistance, Provided advance care planning, Provided psychosocial or spiritual support, Transitioned to hospice  Patient Active Problem List   Diagnosis Date Noted  . Goals of care, counseling/discussion   . Palliative care by specialist   . DNR (do not resuscitate)   . SIRS (systemic inflammatory response syndrome) ([place]) 03/18/2020  . Pharyngitis with viral syndrome 03/17/2020  . Acute viral syndrome 03/17/2020  . AF (paroxysmal atrial fibrillation) ([place]) 03/17/2020  .  Paroxysmal atrial fibrillation with rapid ventricular response ([place]) 03/17/2020  . Hallucinations 12/09/2017  . CAD (coronary artery disease) 12/09/2017  . Hypothyroidism 12/09/2017  . COPD (chronic obstructive pulmonary disease) ([place]) 12/09/2017  . Acute renal failure superimposed on stage 3 chronic kidney disease ([place]) 12/09/2017  . GERD (gastroesophageal reflux disease) 12/09/2017  . Chest pain 04/25/2017  . Chest pain, pleuritic 09/08/2015    Palliative Care Assessment & Plan    Recommendations/Plan:  Continue current treatment. Family would like to see how she does over the weekend, and meet Monday.     Code Status:    Code Status Orders  (From admission, onward)         Start     Ordered   03/17/20 1648  Do not attempt resuscitation (DNR)  Continuous       Question Answer Comment  In the event of cardiac or respiratory ARREST Do not call a "code blue"   In the event of cardiac or respiratory ARREST Do not perform Intubation, CPR, defibrillation or ACLS   In the event of cardiac or respiratory ARREST Use medication by any route, position, wound care, and other measures to relive pain and suffering. May use oxygen, suction and manual treatment of airway obstruction as needed for comfort.   Comments Discussed code status with patient's daughter at the bedside June Sweeny.  Patient is a DO NOT RESUSCITATE      03/17/20 1648        Code Status History    Date Active Date Inactive Code Status Order ID Comments User Context   03/17/2020 1508 03/17/2020 1648 Full Code [id number]  Collier Bullock, MD ED   12/09/2017 2244 12/10/2017 1416 Full Code [id number]  Lance Coon, MD Inpatient   04/25/2017 0348 04/26/2017 1430 Full Code [id number]  Harvie Bridge, DO ED   09/08/2015 1733 09/09/2015 1747 Full Code [id number]  Fritzi Mandes, MD Inpatient   Advance Care Planning Activity    Advance Directive Documentation     Most Recent Value  Type of Advance Directive Out of facility DNR  (pink MOST or yellow form)  Pre-existing out of facility DNR order (yellow form or pink MOST form) Yellow form placed in chart (order not valid for inpatient use)  "MOST" Form in Place? --       Prognosis:  Poor overall    Care plan was discussed with primary RN and MD.   Thank you for allowing the Palliative Medicine Team to assist in the care of this patient.   Time In: 10:40 Time Out: 12:20 Total Time 1 hour 40 min Prolonged Time Billed  yes       Greater than 50%  of this time was spent counseling and coordinating care related to the above assessment and plan.  Asencion Gowda, NP  Please contact Palliative Medicine Team phone at [phone] for questions and concerns.

## 2020-03-22 DIAGNOSIS — B349 Viral infection, unspecified: Secondary | ICD-10-CM | POA: Diagnosis not present

## 2020-03-22 DIAGNOSIS — J029 Acute pharyngitis, unspecified: Secondary | ICD-10-CM | POA: Diagnosis not present

## 2020-03-22 LAB — CULTURE, BLOOD (ROUTINE X 2)
Culture: NO GROWTH
Culture: NO GROWTH
Special Requests: ADEQUATE

## 2020-03-22 MED ORDER — ZOLPIDEM TARTRATE 5 MG PO TABS
5.0000 mg | ORAL_TABLET | Freq: Every evening | ORAL | Status: DC | PRN
  Filled 2020-03-22: qty 1

## 2020-03-22 NOTE — Evaluation (Signed)
Clinical/Bedside Swallow Evaluation Patient Details  Name: [name] MRN: [id number] Date of Birth: 1923-06-20  Today's Date: 03/22/2020 Time: SLP Start Time (ACUTE ONLY): 0845 SLP Stop Time (ACUTE ONLY): 0930 SLP Time Calculation (min) (ACUTE ONLY): 45 min  Past Medical History:  Past Medical History:  Diagnosis Date  . Arthritis   . Blood clot in vein   . CAD (coronary artery disease)   . Cancer (HCC)    squamous cell carcinoma  . COPD (chronic obstructive pulmonary disease) ([place])   . Diverticulitis   . DVT (deep vein thrombosis) in pregnancy   . GERD (gastroesophageal reflux disease)   . Myocardial infarction ([place])   . Pacemaker   . PE (pulmonary thromboembolism) ([place])   . Scoliosis   . Stroke ([place])   . Thyroid disease   . TIA (transient ischemic attack)    Past Surgical History:  Past Surgical History:  Procedure Laterality Date  . APPENDECTOMY    . BREAST SURGERY    . EYE SURGERY    . PACEMAKER IMPLANT    . TONSILLECTOMY     HPI:  Pt is a 86 y.o. female with medical history significant for GERD, scoliosis, hypertension, CAD, squamous cell carcinoma, MI, pacemaker, PE, TIA, CVA who was brought into the ER by EMS from her Facility for evaluation of fever, tachycardia and back pain.  Patient was status of sustained a fall 2 days prior to her hospitalization and has complained of low back pain since then.  Per nursing home staff she had a fever with a T-max of 102F and was also tachycardic.  Patient complains of a sore throat with associated nausea but denies having any vomiting.  She also complains of left flank pain.  She denies having any chest pain, no shortness of breath, no dizziness or lightheadedness, no headache, no changes in her bowel habits.  Tachycardia noted at admit.  Prior to her admit to Facility ~1 week ago, pt lived at home w/ a caregiver full time for the past 8 years, and had a part time caregiver for 6 years prior to that.  She states over the past 4  months she has shown decline. She would have days of being completely lucid and tending to her flowers, and days where she was confused and agitated, not recognizing her children.    Assessment / Plan / Recommendation Clinical Impression  This was a limited assessment d/t pt's reduced participation in taking/accepting po's during the evaluation despite encouragement from Daughter present and SLP. Pt appears at increased risk for aspiration in current presentation/status d/t declined awareness for task of oral intake, and lack of desire to take po's in general. She appears weak and frail; there is concern for delayed timing of pharyngeal swallow thus airway protection. Pt tends to lean to her R side w/ head tilt to R. She required Mod+ positioning support to achieve Midline positioning. She made no immediate c/o sore throat this session. Pt accepted few po trials w/ SLP requiring boluses placed at lips; pinched straw; tsp use. No immediate, overt s/s of aspiration noted, however, a delayed congested cough was noted x1 each w/ trial of thin liquid and puree. No respiratory decline was witnessed; pt's breathing remained calm and unlabored. Vocal quality was clear when she spoke; reduced volume/intensity present. Unsure if directly related to the swallow but again, pt appears at increased risk for aspiration. Oral phase was c/b min increased time for bolus management for full A-P transfer/swallow and oral  clearing. Pt often mumbled "it's all gone" when she was still orally manipulating the bolus residue followed then by a pharyngeal swallow. No overt unilateral weakness noted. Pt often repeated she did not want anything to eat/drink. Recommend a more minced diet consistency for conservation of energy, thin liquids. Recommend pills Crushed in puree for safer swallowing. Recommend full discussion b/t family, MD, and Palliative Care for [place] re: QOL as pt is at increased risk for aspiration especially w/ thin liquids  which can impact/further Pulmonary decline. The above was explained to Daughter; much education was given on food consistencies, feeding support and Positioning, aspiration precautions. Handouts given. ST services can be available for further education/needs while admitted.  SLP Visit Diagnosis: Dysphagia, oropharyngeal phase (R13.12)    Aspiration Risk  Mild aspiration risk;Moderate aspiration risk;Risk for inadequate nutrition/hydration    Diet Recommendation  Dysphagia level 2 (minced foods w/ gravies); Thin liquids w/ strict aspiration precautions. Reflux precautions. 100% supervision w/ all oral intake; support.  Medication Administration: Crushed with puree (for safer swallowing)    Other  Recommendations Recommended Consults:  (Palliative care f/u) Oral Care Recommendations: Oral care BID;Oral care before and after PO;Staff/trained caregiver to provide oral care   Follow up Recommendations Skilled Nursing facility (w/ Hospice services)      Frequency and Duration  (n/a)   (n/a)       Prognosis Prognosis for Safe Diet Advancement: Guarded Barriers to Reach Goals: Time post onset;Severity of deficits;Motivation (decline in status)      Swallow Study   General Date of Onset: 03/16/20 HPI: Pt is a 85 y.o. female with medical history significant for GERD, scoliosis, hypertension, CAD, squamous cell carcinoma, MI, pacemaker, PE, TIA, CVA who was brought into the ER by EMS from her Facility for evaluation of fever, tachycardia and back pain.  Patient was status of sustained a fall 2 days prior to her hospitalization and has complained of low back pain since then.  Per nursing home staff she had a fever with a T-max of 102F and was also tachycardic.  Patient complains of a sore throat with associated nausea but denies having any vomiting.  She also complains of left flank pain.  She denies having any chest pain, no shortness of breath, no dizziness or lightheadedness, no headache, no  changes in her bowel habits.  Tachycardia noted at admit.  Prior to her admit to Facility ~1 week ago, pt lived at home w/ a caregiver full time for the past 8 years, and had a part time caregiver for 6 years prior to that.  She states over the past 4 months she has shown decline. She would have days of being completely lucid and tending to her flowers, and days where she was confused and agitated, not recognizing her children.  Type of Study: Bedside Swallow Evaluation Previous Swallow Assessment: none reported Diet Prior to this Study: Regular;Thin liquids Temperature Spikes Noted: No (wbc 10.6) Respiratory Status: Room air History of Recent Intubation: No Behavior/Cognition: Alert;Confused;Distractible;Requires cueing (Drowsy) Oral Cavity Assessment: Dry Oral Care Completed by SLP: Recent completion by staff Oral Cavity - Dentition: Adequate natural dentition Vision:  (n/a) Self-Feeding Abilities: Total assist Patient Positioning: Postural control adequate for testing (pt tended to lean to her R (Baseline per Dtr)) Baseline Vocal Quality: Low vocal intensity Volitional Cough: Cognitively unable to elicit Volitional Swallow: Unable to elicit    Oral/Motor/Sensory Function Overall Oral Motor/Sensory Function: Within functional limits (appeared wfl during bolus management)   Ice Chips Ice chips:  Not tested   Thin Liquid Thin Liquid: Impaired Presentation: Spoon;Straw (pinched amount; 3 trials) Oral Phase Impairments: Poor awareness of bolus;Reduced lingual movement/coordination Pharyngeal  Phase Impairments: Cough - Delayed (congested cough such as at baseline x1)    Nectar Thick Nectar Thick Liquid: Not tested   Honey Thick Honey Thick Liquid: Not tested   Puree Puree: Impaired Presentation: Spoon (fed; x2) Oral Phase Impairments: Reduced lingual movement/coordination;Poor awareness of bolus Oral Phase Functional Implications: Prolonged oral transit Pharyngeal Phase Impairments: Cough  - Delayed (similar to liquids during trials)   Solid     Solid: Not tested       Orinda Kenner, MS, CCC-SLP [name] 03/22/2020,2:23 PM

## 2020-03-22 NOTE — Progress Notes (Signed)
[facility] NAME: [name]    MR#:  [id number]  DATE OF BIRTH:  1922-10-12  SUBJECTIVE:  Resting quietly dter [name] in the room Drinking some ensure  REVIEW OF SYSTEMS:   Review of Systems  Unable to perform ROS: Mental status change   Tolerating Diet:little Tolerating PT: not able to participate  DRUG ALLERGIES:   Allergies  Allergen Reactions  . Adhesive [Tape] Itching    Reaction: itching  . Clindamycin/Lincomycin Other (See Comments)    Reaction: upset stomach   . Codeine Other (See Comments)  . Demerol [Meperidine] Nausea Only  . Erythromycin Other (See Comments)  . Morphine And Related     Reaction: unknown   . Other Nausea Only    Darvocet  . Penicillins Rash    Has patient had a PCN reaction causing immediate rash, facial/tongue/throat swelling, SOB or lightheadedness with hypotension: unknown Has patient had a PCN reaction causing severe rash involving mucus membranes or skin necrosis: unknown Has patient had a PCN reaction that required hospitalization: unknown  Has patient had a PCN reaction occurring within the last 10 years: unknown  If all of the above answers are "NO", then may proceed with Cephalosporin use.    . Sulfa Antibiotics Rash    VITALS:  Blood pressure 121/63, pulse 98, temperature 98.2 F (36.8 C), temperature source Oral, resp. rate 16, height 5' 3" (1.6 m), weight 61.7 kg, SpO2 95 %.  PHYSICAL EXAMINATION:   Physical Exam limited  GENERAL:  86 y.o.-year-old patient lying in the bed with no acute distress. Weak fraile LUNGS: Normal breath sounds bilaterally, no wheezing, rales, rhonchi. No use of accessory muscles of respiration.  CARDIOVASCULAR: S1, S2 normal. No murmurs, rubs, or gallops.  ABDOMEN: Soft, nontender, nondistended. Bowel sounds present. No organomegaly or mass.  EXTREMITIES: No cyanosis, clubbing or edema b/l.    NEUROLOGIC: patient sedated unable to examine   PSYCHIATRIC:  patient is sleeping , moves all extremities well SKIN: No obvious rash, lesion, or ulcer.   LABORATORY PANEL:  CBC Recent Labs  Lab 03/20/20 0306  WBC 10.6*  HGB 10.8*  HCT 33.8*  PLT 173    Chemistries  Recent Labs  Lab 03/17/20 0923 03/17/20 0923 03/18/20 0649  NA 137   < > 135  K 3.3*   < > 3.3*  CL 100   < > 105  CO2 26   < > 23  GLUCOSE 103*   < > 91  BUN 17   < > 13  CREATININE 1.02*   < > 0.75  CALCIUM 9.2   < > 8.1*  AST 20  --   --   ALT 10  --   --   ALKPHOS 81  --   --   BILITOT 1.8*  --   --    < > = values in this interval not displayed.   Cardiac Enzymes No results for input(s): TROPONINI in the last 168 hours. RADIOLOGY:  No results found. ASSESSMENT AND PLAN:   [name] a 86 y.o.femalewith medical history significant forhypertension who was brought into the ER by EMS for evaluation of fever, tachycardia and back pain. Patient was status of sustained a fall 2 days prior to her hospitalization and has complained of low back pain since then. Per nursing home staff she had a fever with a T-max of 102Fand was also tachycardic. Patient complains of a sore throat with associated  nausea but denies having any vomiting.   SIRS etio unclear-- fever resolved  -suspected due to Pharyngitis? viral syndrome, UC MRSA + -Patient was sent from the nursing home for evaluation of fever with a T-max of 102,tachypneaand sinus tachycardia. -now remains afebrile -Follow-up blood cultures negative -UC MRSA-- Consulted ID-- this could be colonization. Since no symptoms of UTI and hemodynamically stable hold off on antibiotics -CT chest abdomen pelvis -- no acute infectious etiology found  -strep throat negative -COVID negative -continue supportive care PRN Tylenol on IV fluids  Failure to thrive, fall, gradual decline over six months -daughters were concerned about patient's overall decline. -Palliative care met with [name] -- plan for  keeping patient over the weekend to see if she heard PO intake improves and she participates with physical therapy. Patient remains DNR. If she continues to decline further family is agreeable with hospice at [facility] -per palliative care conversation patient has had hospice before -speech therapy to see patient for difficulty swallowing  Hypothyroidism ContinueSynthroid  acute renal failure likely due to poor PO intake -came in with creatinine of 1.02--- IV fluids-- 0.75 -looks clinically dry--not much po intake--increase IVF to 100 cc/hr  Persistent A. Fibwith rapid ventricular rate and h/o SSS -Patient EKG showed sinus tachycardia but repeat EKG done due topersistenttachycardia showedrapid atrial fibrillation -Continue metoprolol --Continue Eliquis as primary prophylaxis for an acute stroke  -Cardiology consult with dr Clayborn Bigness appreciated -increased BB to 37.5 mg bid (not able to take consistently)  Hypokalemia Secondary to diuretic use Supplement potassium  PT to see pt--unable to participate  DVT prophylaxis:Eliquis Code Status:DNR at admission Family Communication:patient's daughterJune in the room Disposition Plan:Back to previous home environment--[facility] Consults called:None  Status is: Inpatient  Remains inpatient appropriate because:Ongoing diagnostic testing needed not appropriate for outpatient work up   Dispo: The patient is from: SNF  Anticipated d/c is to: SNF  Anticipated d/c date is: 1-2 days  Patient currently is not medically stable to d/c. Palliative care consultation. Family to met --wants to give some time atleast weekend to see if her po intake improve and if she is able to do some PT TOC for discharge planning   TOTAL TIME TAKING CARE OF THIS PATIENT: *20* minutes.  >50% time spent on counselling and coordination of care  Note: This dictation was prepared with Dragon dictation  along with smaller phrase technology. Any transcriptional errors that result from this process are unintentional.  [name] M.D    [facility]   CC: Primary care physician; Idelle Crouch, MDPatient ID: [name], female   DOB: March 12, 1923, 86 y.o.   MRN: [id number]

## 2020-03-22 NOTE — Evaluation (Signed)
Physical Therapy Evaluation Patient Details Name: [name] MRN: [id number] DOB: 12-Jan-1923 Today's Date: 03/22/2020   History of Present Illness  Ms. [name] was admitted for pharyngitis with viral syndrome. Chief complaints include fever, tachycardia, mechanical fall 2 days prior to hospital arrival with resulting back pain, and sore throat with associated nausea. PMH includes HTN, CAD, squamous cell carcinoma, GERD, MI, pacemaker, PE, scoliosis, CVA, TIA, and thyroid disease.  Clinical Impression  Pt lying in bed positioned with R cervical lateral flexion and with R lateral lean in bed. Daughter present throughout evaluation. At this time, pt unable to follow commands consistently to fully participate in skilled therapy. Pt noted to move RUE sporadically reaching up to wipe her mouth but unable to perform when prompted. Pt required total assist to reposition in bed however slowly returned to R lateral positioning. Pillows placed to attempt to keep pt in midline position. Pt's daughter educated on pt needing to be able to follow commands and actively remain awake and alert to participate in PT. Daughter requested for all 4 bedrails to be up and in place. If pt demonstrates increase in alertness, attentiveness, and participates in consistent command follow, please re-consult [place]. At this time, hospice care appears to be most appropriate.    Follow Up Recommendations No PT follow up    Equipment Recommendations  None recommended by PT    Recommendations for Other Services       Precautions / Restrictions Precautions Precautions: Fall Restrictions Weight Bearing Restrictions: No      Mobility  Bed Mobility Overal bed mobility: Needs Assistance Bed Mobility:  (adjusting position in bed)           General bed mobility comments: Pt required total A to reposition and improve alignment.  Transfers                 General transfer comment: not attempted/not  appropriate  Ambulation/Gait             General Gait Details: not attempted/not appropriate  Stairs            Wheelchair Mobility    Modified Rankin (Stroke Patients Only)       Balance Overall balance assessment:  (not attempted due to decreased arousal)                                           Pertinent Vitals/Pain Pain Assessment: Faces (pt reports feeling "bad all over") Faces Pain Scale: Hurts little more Pain Location: "all over" Pain Descriptors / Indicators: Discomfort Pain Intervention(s): Monitored during session;Limited activity within patient's tolerance    Home Living Family/patient expects to be discharged to:: Skilled nursing facility                 Additional Comments: Pt's daughter reports that pt is to be discharged back to [facility] with hospice care possibly on Mon, July 19    Prior Function Level of Independence: Needs assistance   Gait / Transfers Assistance Needed: SBA-CGA for ambulation; min to mod A for transfers  ADL's / [employer] Assistance Needed: Daughter reported that prior to moving to [facility] that pt had personal caregivers who assisted with sponge baths, chores, etc.  Comments: Daughter reports that pt ambulated using rollator and has progressively been declining since winter and needed SBA to [facility] for ambulation. She also stated that the pt  needed boosting assist for sit to stand transfers.     Hand Dominance        Extremity/Trunk Assessment   Upper Extremity Assessment Upper Extremity Assessment: Difficult to assess due to impaired cognition    Lower Extremity Assessment Lower Extremity Assessment: Difficult to assess due to impaired cognition       Communication   Communication: No difficulties (pt very soft spoken)  Cognition Arousal/Alertness: Lethargic Behavior During Therapy: Flat affect Overall Cognitive Status: Difficult to assess                                  General Comments: Pt alert to self only and unaware of place or situation - she just stated that she feels bad all over      General Comments      Exercises     Assessment/Plan    PT Assessment Patent does not need any further PT services  PT Problem List         PT Treatment Interventions      PT Goals (Current goals can be found in the Care Plan section)  Acute Rehab PT Goals Patient Stated Goal: did not state PT Goal Formulation: With family Time For Goal Achievement: 03/22/20 Potential to Achieve Goals: Poor    Frequency     Barriers to discharge        Co-evaluation               AM-PAC PT "6 Clicks" Mobility  Outcome Measure Help needed turning from your back to your side while in a flat bed without using bedrails?: Total Help needed moving from lying on your back to sitting on the side of a flat bed without using bedrails?: Total Help needed moving to and from a bed to a chair (including a wheelchair)?: Total Help needed standing up from a chair using your arms (e.g., wheelchair or bedside chair)?: Total Help needed to walk in hospital room?: Total Help needed climbing 3-5 steps with a railing? : Total 6 Click Score: 6    End of Session   Activity Tolerance: Patient limited by fatigue;Patient limited by lethargy Patient left: in bed;with call bell/phone within reach;with family/visitor present   PT Visit Diagnosis: Muscle weakness (generalized) (M62.81);Adult, failure to thrive (R62.7);Pain Pain - part of body:  (generally feeling bad "all over")    Time: 0910-0948 PT Time Calculation (min) (ACUTE ONLY): 38 min   Charges:   PT Evaluation $PT Eval High Complexity: 1 High PT Treatments $Therapeutic Activity: 8-22 mins        Vale Haven, SPT  Shevon Sian 03/22/2020, 12:44 PM

## 2020-03-22 NOTE — Progress Notes (Signed)
Pt only ate a few bites of breakfast lunch and dinner. Very minium appetite.

## 2020-03-23 DIAGNOSIS — J029 Acute pharyngitis, unspecified: Secondary | ICD-10-CM | POA: Diagnosis not present

## 2020-03-23 DIAGNOSIS — B349 Viral infection, unspecified: Secondary | ICD-10-CM | POA: Diagnosis not present

## 2020-03-23 LAB — CBC
HCT: 33.7 % — ABNORMAL LOW (ref 36.0–46.0)
Hemoglobin: 10.2 g/dL — ABNORMAL LOW (ref 12.0–15.0)
MCH: 26.7 pg (ref 26.0–34.0)
MCHC: 30.3 g/dL (ref 30.0–36.0)
MCV: 88.2 fL (ref 80.0–100.0)
Platelets: 212 10*3/uL (ref 150–400)
RBC: 3.82 MIL/uL — ABNORMAL LOW (ref 3.87–5.11)
RDW: 14.2 % (ref 11.5–15.5)
WBC: 5.8 10*3/uL (ref 4.0–10.5)
nRBC: 0 % (ref 0.0–0.2)

## 2020-03-23 NOTE — Progress Notes (Signed)
[facility] [place] NAME: [name]    MR#:  [id number]  DATE OF BIRTH:  30-Jan-1923  SUBJECTIVE:  Resting quietly dter [name] and [name] in the room took a few bites of dinner and lunch last night. Tolerating ensure per daughter.  REVIEW OF SYSTEMS:   Review of Systems  Unable to perform ROS: Mental status change   Tolerating Diet:little Tolerating PT: not able to participate  DRUG ALLERGIES:   Allergies  Allergen Reactions  . Adhesive [Tape] Itching    Reaction: itching  . Clindamycin/Lincomycin Other (See Comments)    Reaction: upset stomach   . Codeine Other (See Comments)  . Demerol [Meperidine] Nausea Only  . Erythromycin Other (See Comments)  . Morphine And Related     Reaction: unknown   . Other Nausea Only    Darvocet  . Penicillins Rash    Has patient had a PCN reaction causing immediate rash, facial/tongue/throat swelling, SOB or lightheadedness with hypotension: unknown Has patient had a PCN reaction causing severe rash involving mucus membranes or skin necrosis: unknown Has patient had a PCN reaction that required hospitalization: unknown  Has patient had a PCN reaction occurring within the last 10 years: unknown  If all of the above answers are "NO", then may proceed with Cephalosporin use.    . Sulfa Antibiotics Rash    VITALS:  Blood pressure 111/66, pulse 67, temperature 98 F (36.7 C), resp. rate 15, height 5' 3"  (1.6 m), weight 61.7 kg, SpO2 96 %.  PHYSICAL EXAMINATION:   Physical Exam limited  GENERAL:  86 y.o.-year-old patient lying in the bed with no acute distress. Weak fraile LUNGS: Normal breath sounds bilaterally, no wheezing, rales, rhonchi. No use of accessory muscles of respiration.  CARDIOVASCULAR: S1, S2 normal. No murmurs, rubs, or gallops.  ABDOMEN: Soft, nontender, nondistended. Bowel sounds present. No organomegaly or mass.  EXTREMITIES: No cyanosis, clubbing or edema b/l.     NEUROLOGIC: patient sedated unable to examine  PSYCHIATRIC:  patient is sleeping , moves all extremities well SKIN: No obvious rash, lesion, or ulcer.   LABORATORY PANEL:  CBC Recent Labs  Lab 03/23/20 0416  WBC 5.8  HGB 10.2*  HCT 33.7*  PLT 212    Chemistries  Recent Labs  Lab 03/17/20 0923 03/17/20 0923 03/18/20 0649  NA 137   < > 135  K 3.3*   < > 3.3*  CL 100   < > 105  CO2 26   < > 23  GLUCOSE 103*   < > 91  BUN 17   < > 13  CREATININE 1.02*   < > 0.75  CALCIUM 9.2   < > 8.1*  AST 20  --   --   ALT 10  --   --   ALKPHOS 81  --   --   BILITOT 1.8*  --   --    < > = values in this interval not displayed.   Cardiac Enzymes No results for input(s): TROPONINI in the last 168 hours. RADIOLOGY:  No results found. ASSESSMENT AND PLAN:   [name] a 86 y.o.femalewith medical history significant forhypertension who was brought into the ER by EMS for evaluation of fever, tachycardia and back pain. Patient was status of sustained a fall 2 days prior to her hospitalization and has complained of low back pain since then. Per nursing home staff she had a fever with a T-max of 102Fand was  also tachycardic. Patient complains of a sore throat with associated nausea but denies having any vomiting.   SIRS etio unclear-- fever resolved  -suspected due to Pharyngitis? viral syndrome, UC MRSA + -Patient was sent from the nursing home for evaluation of fever with a T-max of 102,tachypneaand sinus tachycardia. -now remains afebrile -Follow-up blood cultures negative -UC MRSA-- Consulted ID-- this could be colonization. Since no symptoms of UTI and hemodynamically stable hold off on antibiotics -CT chest abdomen pelvis -- no acute infectious etiology found  -strep throat negative -COVID negative -continue supportive care PRN Tylenol on IV fluids  Failure to thrive, fall, gradual decline over six months -daughters are concerned about patient's overall  decline. -Palliative care met with dters -- plan for keeping patient over the weekend to see if she heard PO intake improves and she participates with physical therapy. Patient remains DNR. If she continues to decline further family is agreeable with hospice at [facility] -per palliative care conversation patient has had hospice before  Hypothyroidism ContinueSynthroid  acute renal failure likely due to poor PO intake -came in with creatinine of 1.02--- IV fluids-- 0.75 -looks clinically dry--not much po intake--cont fluids at low rate  Persistent A. Fibwith rapid ventricular rate and h/o SSS -Continue metoprolol --Continue Eliquis as primary prophylaxis for an acute stroke  -Cardiology consult with dr Clayborn Bigness appreciated  Hypokalemia Secondary to diuretic use Supplement potassium  PT to see pt--unable to participate  DVT prophylaxis:Eliquis Code Status:DNR at admission Family Communication:patient's daughtersJune/[name] in the room Disposition Plan:Back to previous home environment--[facility] Consults called:None  Status is: Inpatient  Remains inpatient appropriate because:Ongoing diagnostic testing needed not appropriate for outpatient work up   Dispo: The patient is from: SNF  Anticipated d/c is to: SNF [place] with Hospice  Anticipated d/c date is: Monday 7/19  Patient currently is  Medically best at baseline  to d/c. Palliative care consultation. Family to met --wants to give some time atleast weekend to see if her po intake improve and if she is able to do some PT TOC for discharge planning   TOTAL TIME TAKING CARE OF THIS PATIENT: *20* minutes.  >50% time spent on counselling and coordination of care  Note: This dictation was prepared with Dragon dictation along with smaller phrase technology. Any transcriptional errors that result from this process are unintentional.  [name]    [facility]   CC: Primary care physician; [name], MDPatient ID: [name], female   DOB: [birth year], 86 y.o.   MRN: [id number]

## 2020-03-24 DIAGNOSIS — J029 Acute pharyngitis, unspecified: Secondary | ICD-10-CM | POA: Diagnosis not present

## 2020-03-24 DIAGNOSIS — B349 Viral infection, unspecified: Secondary | ICD-10-CM | POA: Diagnosis not present

## 2020-03-24 LAB — SARS CORONAVIRUS 2 BY RT PCR (HOSPITAL ORDER, PERFORMED IN ~~LOC~~ HOSPITAL LAB): SARS Coronavirus 2: NEGATIVE

## 2020-03-24 MED ORDER — LIDOCAINE 5 % EX PTCH: 1.0000 | MEDICATED_PATCH | CUTANEOUS | 0 refills | Status: AC

## 2020-03-24 MED ORDER — TRAMADOL HCL 50 MG PO TABS: 50.0000 mg | ORAL_TABLET | Freq: Four times a day (QID) | ORAL | 0 refills | Status: AC

## 2020-03-24 MED ORDER — MORPHINE SULFATE (CONCENTRATE) 10 MG/0.5ML PO SOLN: 5.0000 mg | Freq: Four times a day (QID) | ORAL | 0 refills | Status: AC | PRN

## 2020-03-24 MED ORDER — MELATONIN 5 MG PO TABS: 5.0000 mg | ORAL_TABLET | Freq: Every day | ORAL | 0 refills | Status: AC

## 2020-03-24 MED ORDER — MAGIC MOUTHWASH: 5.0000 mL | Freq: Three times a day (TID) | ORAL | 0 refills | Status: AC

## 2020-03-24 MED ORDER — LIDOCAINE 5 % EX PTCH
1.0000 | MEDICATED_PATCH | CUTANEOUS | Status: DC
  Administered 2020-03-24: 08:00:00 1 via TRANSDERMAL
  Filled 2020-03-24: qty 1

## 2020-03-24 MED ORDER — MORPHINE SULFATE (CONCENTRATE) 10 MG/0.5ML PO SOLN: 5.0000 mg | Freq: Four times a day (QID) | ORAL | Status: DC | PRN

## 2020-03-24 NOTE — TOC Transition Note (Signed)
Transition of Care [facility]) - CM/SW Discharge Note   Patient Details  Name: [name] MRN: [id number] Date of Birth: 04/28/1923  [facility] of [facility]) CM/SW Contact:  Elease Hashimoto, LCSW Phone Number: 03/24/2020, 10:08 AM   Clinical Narrative:  Pt medically stable to transfer back to [facility] today along with hospice following. Daughter in the room and made aware plan to set up EMS for 11:00. Bedside RN-Tiffanie aware and will call report to [phone]. DC packet in chart no further needs. Going to room 302A.     Final next level of care: Long Term Nursing Home Barriers to Discharge: Barriers Resolved   Patient Goals and CMS Choice Patient states their goals for this hospitalization and ongoing recovery are:: Plan for her to return to [facility] when medically ready CMS Medicare.gov Compare Post Acute Care list provided to:: Patient Represenative (must comment) (JUne-daughter)    Discharge Placement   Existing PASRR number confirmed : 03/20/20          Patient chooses bed at: [facility] Patient to be transferred to facility by: EMS Name of family member notified: Ann-daughter Patient and family notified of of transfer: 03/24/20  Discharge Plan and Services In-house Referral: Clinical Social Work   Post Acute Care Choice: Nursing Home                               Social Determinants of Health (SDOH) Interventions     Readmission Risk Interventions No flowsheet data found.

## 2020-03-24 NOTE — NC FL2 (Cosign Needed)
[place] LEVEL OF CARE SCREENING TOOL     IDENTIFICATION  Patient Name: [name] Birthdate: Jan 10, 1923 Sex: female Admission Date (Current Location): 03/17/2020  [place] and [place] Number:  Engineering geologist and Address:  [facility], [address] [place], [place] [address]      Provider Number: [id number]  Attending Physician Name and Address:  Fritzi Mandes, MD  Relative Name and Phone Number:  Claudean Severance [phone]    Current Level of Care: Hospital Recommended Level of Care: [place] Prior Approval Number:    Date Approved/Denied:   PASRR Number: [id number]  Discharge Plan: SNF    Current Diagnoses: Patient Active Problem List   Diagnosis Date Noted  . Goals of care, counseling/discussion   . Palliative care by specialist   . DNR (do not resuscitate)   . SIRS (systemic inflammatory response syndrome) ([place]) 03/18/2020  . Pharyngitis with viral syndrome 03/17/2020  . Acute viral syndrome 03/17/2020  . AF (paroxysmal atrial fibrillation) ([place]) 03/17/2020  . Paroxysmal atrial fibrillation with rapid ventricular response ([place]) 03/17/2020  . Hallucinations 12/09/2017  . CAD (coronary artery disease) 12/09/2017  . Hypothyroidism 12/09/2017  . COPD (chronic obstructive pulmonary disease) ([place]) 12/09/2017  . Acute renal failure superimposed on stage 3 chronic kidney disease ([place]) 12/09/2017  . GERD (gastroesophageal reflux disease) 12/09/2017  . Chest pain 04/25/2017  . Chest pain, pleuritic 09/08/2015    Orientation RESPIRATION BLADDER Height & Weight     Self, Place  Normal External catheter Weight: 136 lb 0.4 oz (61.7 kg) Height:  5\' 3"  (160 cm)  BEHAVIORAL SYMPTOMS/MOOD NEUROLOGICAL BOWEL NUTRITION STATUS      Continent Diet  AMBULATORY STATUS COMMUNICATION OF NEEDS Skin   Limited Assist Verbally Skin abrasions                       Personal Care Assistance Level of  Assistance  Bathing, Dressing Bathing Assistance: Limited assistance   Dressing Assistance: Limited assistance     Functional Limitations Info             SPECIAL CARE FACTORS FREQUENCY  PT (By licensed PT)     PT Frequency: No PT fgollow up recommended              Contractures Contractures Info: Not present    Additional Factors Info  Code Status, Allergies, Isolation Precautions Code Status Info: DNR Allergies Info: Adhesive tape, Clindamycin, Codiene, Demerol, Sulfa antibiotics, Pencillins, Morphine, Erthyromycin     Isolation Precautions Info: hx-MRSA     Current Medications (03/24/2020):  This is the current hospital active medication list Current Facility-Administered Medications  Medication Dose Route Frequency Provider Last Rate Last Admin  . acetaminophen (TYLENOL) 160 MG/5ML solution 650 mg  650 mg Oral Once Delman Kitten, MD      . apixaban Arne Cleveland) tablet 2.5 mg  2.5 mg Oral BID Agbata, Tochukwu, MD   2.5 mg at 03/24/20 0841  . dextrose 5 %-0.9 % sodium chloride infusion   Intravenous Continuous Fritzi Mandes, MD   Stopped at 03/23/20 2213  . fluticasone (FLONASE) 50 MCG/ACT nasal spray 1 spray  1 spray Each Nare Daily PRN Agbata, Tochukwu, MD      . iopamidol (ISOVUE-370) 76 % injection 75 mL  75 mL Intravenous Once PRN Delman Kitten, MD      . ipratropium-albuterol (DUONEB) 0.5-2.5 (3) MG/3ML nebulizer solution 3 mL  3 mL Nebulization Q6H PRN Sharion Settler, NP      .  levothyroxine (SYNTHROID) tablet 50 mcg  50 mcg Oral QAC breakfast Agbata, Tochukwu, MD   50 mcg at 03/24/20 0840  . lidocaine (LIDODERM) 5 % 1 patch  1 patch Transdermal Q24H Fritzi Mandes, MD   1 patch at 03/24/20 0745  . magic mouthwash  5 mL Oral TID Fritzi Mandes, MD   5 mL at 03/23/20 2123   And  . lidocaine (XYLOCAINE) 2 % viscous mouth solution 5 mL  5 mL Mouth/Throat TID Fritzi Mandes, MD   5 mL at 03/23/20 2123  . loratadine (CLARITIN) tablet 10 mg  10 mg Oral Daily PRN Agbata, Tochukwu, MD       . melatonin tablet 5 mg  5 mg Oral QHS Lang Snow, NP   5 mg at 03/23/20 2123  . metoprolol tartrate (LOPRESSOR) tablet 25 mg  25 mg Oral BID Callwood, Dwayne D, MD   25 mg at 03/24/20 0841  . morphine CONCENTRATE 10 MG/0.5ML oral solution 5 mg  5 mg Oral Q6H PRN Fritzi Mandes, MD      . ondansetron [facility]) tablet 4 mg  4 mg Oral Q6H PRN Agbata, Tochukwu, MD       Or  . ondansetron (ZOFRAN) injection 4 mg  4 mg Intravenous Q6H PRN Agbata, Tochukwu, MD   4 mg at 03/22/20 1141  . phenol-menthol (CEPASTAT) lozenge 1 lozenge  1 lozenge Buccal PRN Fritzi Mandes, MD      . polyethylene glycol (MIRALAX / GLYCOLAX) packet 17 g  17 g Oral Daily PRN Agbata, Tochukwu, MD   17 g at 03/18/20 0901  . polyvinyl alcohol (LIQUIFILM TEARS) 1.4 % ophthalmic solution 1 drop  1 drop Both Eyes Daily PRN Agbata, Tochukwu, MD      . polyvinyl alcohol (LIQUIFILM TEARS) 1.4 % ophthalmic solution 1 drop  1 drop Both Eyes TID Fritzi Mandes, MD   1 drop at 03/23/20 2124  . potassium chloride SA (KLOR-CON) CR tablet 10 mEq  10 mEq Oral Daily Agbata, Tochukwu, MD   10 mEq at 03/23/20 1022  . predniSONE (DELTASONE) tablet 5 mg  5 mg Oral Q breakfast Agbata, Tochukwu, MD   5 mg at 03/24/20 0840  . senna (SENOKOT) tablet 8.6 mg  1 tablet Oral BID Agbata, Tochukwu, MD   8.6 mg at 03/24/20 0841  . traMADol (ULTRAM) tablet 50 mg  50 mg Oral QID Agbata, Tochukwu, MD   50 mg at 03/24/20 0841  . vitamin B-12 (CYANOCOBALAMIN) tablet 1,000 mcg  1,000 mcg Oral Daily Agbata, Tochukwu, MD   1,000 mcg at 03/23/20 1025  . zolpidem (AMBIEN) tablet 5 mg  5 mg Oral QHS PRN Fritzi Mandes, MD         Discharge Medications: Please see discharge summary for a list of discharge medications.  Relevant Imaging Results:  Relevant Lab Results:   Additional Information SSN: [id number]  Izaya Netherton, Gardiner Rhyme, LCSW

## 2020-03-24 NOTE — Progress Notes (Signed)
Pt transported to [facility] via EMS in stable condition.

## 2020-03-24 NOTE — Discharge Instructions (Signed)
Hospice to follow at facility

## 2020-03-24 NOTE — Discharge Summary (Signed)
[facility] NAME: [name]    MR#:  [id number]  DATE OF BIRTH:  1923-05-30  DATE OF ADMISSION:  03/17/2020 ADMITTING PHYSICIAN: Fritzi Mandes, MD  DATE OF DISCHARGE: 03/24/2020  PRIMARY CARE PHYSICIAN: Idelle Crouch, MD    ADMISSION DIAGNOSIS:  SIRS (systemic inflammatory response syndrome) (HCC) [R65.10] Acute viral syndrome [B34.9] Paroxysmal atrial fibrillation with rapid ventricular response ([place]) [I48.0] Pharyngitis, unspecified etiology [J02.9]  DISCHARGE DIAGNOSIS:  SIRS POA--resolved, etiology unclear Afib with RVR with h/o persistant afib Failure to thrive Acute Renal failure --POA--resolved SECONDARY DIAGNOSIS:   Past Medical History:  Diagnosis Date  . Arthritis   . Blood clot in vein   . CAD (coronary artery disease)   . Cancer (HCC)    squamous cell carcinoma  . COPD (chronic obstructive pulmonary disease) ([place])   . Diverticulitis   . DVT (deep vein thrombosis) in pregnancy   . GERD (gastroesophageal reflux disease)   . Myocardial infarction ([place])   . Pacemaker   . PE (pulmonary thromboembolism) ([place])   . Scoliosis   . Stroke ([place])   . Thyroid disease   . TIA (transient ischemic attack)     HOSPITAL COURSE:  [name] a 86 y.o.femalewith medical history significant forhypertension who was brought into the ER by EMS for evaluation of fever, tachycardia and back pain. Patient was status of sustained a fall 2 days prior to her hospitalization and has complained of low back pain since then. Per nursing home staff she had a fever with a T-max of 102Fand was also tachycardic. Patient complains of a sore throat with associated nausea but denies having any vomiting.  SIRS etio unclear-- fever resolved  -suspected due toPharyngitis? viral syndrome, UC MRSA + -Patient was sent from the nursing home for evaluation of fever with a T-max of 102,tachypneaand sinus tachycardia. -now remains  afebrile -Follow-up blood culturesnegative -UC MRSA-- Consulted ID-- this could be colonization. Since no symptoms of UTI and hemodynamically stable hold off on antibiotics -CTchest abdomen pelvis--no acute infectious etiology found  -strep throat negative -COVID on admission negative -continue supportive carePRN Tylenol  Failure to thrive, fall, gradual decline over six months -daughters are concerned about patient's overall decline. -Palliative care met with dters --Patient remains DNR. If she continues to decline further family is agreeable with transition to  hospice home later date -per palliative care conversation patient has had hospice before. -pt will discharge to [facility] with hospice today. -MOST form signed by me -morphine rxed  Hypothyroidism ContinueSynthroid  acute renal failurelikely due to poor PO intake -came in with creatinine of 1.02---IV fluids--0.75 -received IVF  PersistentA. Fibwith rapid ventricular rateand h/o SSS --Continue metoprolol --Continue Eliquis as primary prophylaxis for an acute stroke -Cardiology consult with dr Clayborn Bigness appreciated.  Hypokalemia Secondary to diuretic use Supplement potassium  PT to see pt--unable to participate  DVT prophylaxis:Eliquis Code Status:DNR at admission Family Communication:patient's daughter [name] in the room today Disposition Plan:Back to previous environment[facility] with Hospice Consults called:Palliative care, ID  Status is: Inpatient   Dispo: The patient is from:SNF Anticipated d/c is to:SNF [place] with Hospice Anticipated d/c date is: Monday 7/19 Patient currently is  Medically best at baseline  to d/c.   CONSULTS OBTAINED:  Treatment Team:  Tsosie Billing, MD Yolonda Kida, MD  DRUG ALLERGIES:   Allergies  Allergen Reactions  . Adhesive [Tape] Itching    Reaction: itching  .  Clindamycin/Lincomycin Other (  See Comments)    Reaction: upset stomach   . Codeine Other (See Comments)  . Demerol [Meperidine] Nausea Only  . Erythromycin Other (See Comments)  . Morphine And Related     Reaction: unknown   . Other Nausea Only    Darvocet  . Penicillins Rash    Has patient had a PCN reaction causing immediate rash, facial/tongue/throat swelling, SOB or lightheadedness with hypotension: unknown Has patient had a PCN reaction causing severe rash involving mucus membranes or skin necrosis: unknown Has patient had a PCN reaction that required hospitalization: unknown  Has patient had a PCN reaction occurring within the last 10 years: unknown  If all of the above answers are "NO", then may proceed with Cephalosporin use.    . Sulfa Antibiotics Rash    DISCHARGE MEDICATIONS:   Allergies as of 03/24/2020      Reactions   Adhesive [tape] Itching   Reaction: itching   Clindamycin/lincomycin Other (See Comments)   Reaction: upset stomach    Codeine Other (See Comments)   Demerol [meperidine] Nausea Only   Erythromycin Other (See Comments)   Morphine And Related    Reaction: unknown   Other Nausea Only   Darvocet   Penicillins Rash   Has patient had a PCN reaction causing immediate rash, facial/tongue/throat swelling, SOB or lightheadedness with hypotension: unknown Has patient had a PCN reaction causing severe rash involving mucus membranes or skin necrosis: unknown Has patient had a PCN reaction that required hospitalization: unknown  Has patient had a PCN reaction occurring within the last 10 years: unknown  If all of the above answers are "NO", then may proceed with Cephalosporin use.   Sulfa Antibiotics Rash      Medication List    STOP taking these medications   aspirin EC 81 MG tablet   furosemide 40 MG tablet Commonly known as: LASIX   loratadine 10 MG tablet Commonly known as: CLARITIN   potassium chloride 10 MEQ tablet Commonly known as:  KLOR-CON     TAKE these medications   Eliquis 2.5 MG Tabs tablet Generic drug: apixaban Take 2.5 mg by mouth 2 (two) times daily.   fluticasone 50 MCG/ACT nasal spray Commonly known as: FLONASE Place 1 spray into both nostrils daily as needed for allergies or rhinitis.   ICY HOT ADVANCED RELIEF EX Apply 1 application topically daily.   levothyroxine 50 MCG tablet Commonly known as: SYNTHROID Take 50 mcg by mouth daily before breakfast. 30 minutes before breakfast   lidocaine 5 % Commonly known as: LIDODERM Place 1 patch onto the skin daily. Remove & Discard patch within 12 hours or as directed by MD   magic mouthwash Soln Take 5 mLs by mouth 3 (three) times daily.   melatonin 5 MG Tabs Take 1 tablet (5 mg total) by mouth at bedtime.   metoprolol tartrate 25 MG tablet Commonly known as: LOPRESSOR Take 25 mg by mouth 2 (two) times daily.   morphine CONCENTRATE 10 MG/0.5ML Soln concentrated solution Take 0.25 mLs (5 mg total) by mouth every 6 (six) hours as needed for severe pain, anxiety or shortness of breath.   polyethylene glycol 17 g packet Commonly known as: MIRALAX / GLYCOLAX Take 17 g by mouth daily as needed for mild constipation.   predniSONE 5 MG tablet Commonly known as: DELTASONE Take 5 mg by mouth daily with breakfast.   senna 8.6 MG Tabs tablet Commonly known as: SENOKOT Take 1 tablet by mouth 2 (two) times daily.  SYSTANE ULTRA OP Apply 1 drop to eye daily as needed (Dry eye).   traMADol 50 MG tablet Commonly known as: ULTRAM Take 1 tablet (50 mg total) by mouth 4 (four) times daily.   vitamin B-12 1000 MCG tablet Commonly known as: CYANOCOBALAMIN Take 1,000 mcg by mouth daily.       If you experience worsening of your admission symptoms, develop shortness of breath, life threatening emergency, suicidal or homicidal thoughts you must seek medical attention immediately by calling 911 or calling your MD immediately  if symptoms less  severe.  You Must read complete instructions/literature along with all the possible adverse reactions/side effects for all the Medicines you take and that have been prescribed to you. Take any new Medicines after you have completely understood and accept all the possible adverse reactions/side effects.   Please note  You were cared for by a hospitalist during your hospital stay. If you have any questions about your discharge medications or the care you received while you were in the hospital after you are discharged, you can call the unit and asked to speak with the hospitalist on call if the hospitalist that took care of you is not available. Once you are discharged, your primary care physician will handle any further medical issues. Please note that NO REFILLS for any discharge medications will be authorized once you are discharged, as it is imperative that you return to your primary care physician (or establish a relationship with a primary care physician if you do not have one) for your aftercare needs so that they can reassess your need for medications and monitor your lab values. Today   SUBJECTIVE  Right hip pain   VITAL SIGNS:  Blood pressure 136/60, pulse 82, temperature 98.4 F (36.9 C), temperature source Oral, resp. rate 17, height '5\' 3"'$  (1.6 m), weight 61.7 kg, SpO2 99 %.  I/O:  No intake or output data in the 24 hours ending 03/24/20 0806  PHYSICAL EXAMINATION:  GENERAL:  86 y.o.-year-old patient lying in the bed with no acute distress. Weak fraile  LUNGS: Normal breath sounds bilaterally, no wheezing, rales,rhonchi or crepitation. No use of accessory muscles of respiration.  CARDIOVASCULAR: S1, S2 normal. No murmurs, rubs, or gallops.  ABDOMEN: Soft, non-tender, non-distended. Bowel sounds present. No organomegaly or mass.  EXTREMITIES: No pedal edema, cyanosis, or clubbing.  PSYCHIATRIC: The patient is resting.  SKIN: No obvious rash, lesion, or ulcer.   DATA REVIEW:    CBC  Recent Labs  Lab 03/23/20 0416  WBC 5.8  HGB 10.2*  HCT 33.7*  PLT 212    Chemistries  Recent Labs  Lab 03/17/20 0923 03/17/20 0923 03/18/20 0649  NA 137   < > 135  K 3.3*   < > 3.3*  CL 100   < > 105  CO2 26   < > 23  GLUCOSE 103*   < > 91  BUN 17   < > 13  CREATININE 1.02*   < > 0.75  CALCIUM 9.2   < > 8.1*  AST 20  --   --   ALT 10  --   --   ALKPHOS 81  --   --   BILITOT 1.8*  --   --    < > = values in this interval not displayed.    Microbiology Results   Recent Results (from the past 240 hour(s))  Blood Culture (routine x 2)     Status: None   Collection Time:  03/17/20  9:23 AM   Specimen: BLOOD  Result Value Ref Range Status   Specimen Description BLOOD LEFT WRIST  Final   Special Requests   Final    BOTTLES DRAWN AEROBIC AND ANAEROBIC Blood Culture adequate volume   Culture   Final    NO GROWTH 5 DAYS Performed at [facility], [address], [place], [place] [address]    Report Status 03/22/2020 FINAL  Final  SARS Coronavirus 2 by RT PCR (hospital order, performed in [facility] hospital lab) Nasopharyngeal Nasopharyngeal Swab     Status: None   Collection Time: 03/17/20  9:23 AM   Specimen: Nasopharyngeal Swab  Result Value Ref Range Status   SARS Coronavirus 2 NEGATIVE NEGATIVE Final    Comment: (NOTE) SARS-CoV-2 target nucleic acids are NOT DETECTED.  The SARS-CoV-2 RNA is generally detectable in upper and lower respiratory specimens during the acute phase of infection. The lowest concentration of SARS-CoV-2 viral copies this assay can detect is 250 copies / mL. A negative result does not preclude SARS-CoV-2 infection and should not be used as the sole basis for treatment or other patient management decisions.  A negative result may occur with improper specimen collection / handling, submission of specimen other than nasopharyngeal swab, presence of viral mutation(s) within the areas targeted by this assay, and inadequate  number of viral copies (<250 copies / mL). A negative result must be combined with clinical observations, patient history, and epidemiological information.  Fact Sheet for Patients:   StrictlyIdeas.no  Fact Sheet for Healthcare Providers: BankingDealers.co.za  This test is not yet approved or  cleared by the [place] FDA and has been authorized for detection and/or diagnosis of SARS-CoV-2 by FDA under an Emergency Use Authorization (EUA).  This EUA will remain in effect (meaning this test can be used) for the duration of the COVID-19 declaration under Section 564(b)(1) of the Act, 21 U.S.C. section 360bbb-3(b)(1), unless the authorization is terminated or revoked sooner.  Performed at [facility], [place], [place] [address]   Group A Strep by PCR [facility] Only)     Status: None   Collection Time: 03/17/20  9:23 AM   Specimen: Nasopharyngeal Swab; Sterile Swab  Result Value Ref Range Status   Group A Strep by PCR NOT DETECTED NOT DETECTED Final    Comment: Performed at [facility], [address] [place], [place] [address]  Blood Culture (routine x 2)     Status: None   Collection Time: 03/17/20  9:25 AM   Specimen: BLOOD  Result Value Ref Range Status   Specimen Description BLOOD LEFT FOREARM  Final   Special Requests   Final    BOTTLES DRAWN AEROBIC AND ANAEROBIC Blood Culture results may not be optimal due to an excessive volume of blood received in culture bottles   Culture   Final    NO GROWTH 5 DAYS Performed at [facility], [address] [place], [place] [address]    Report Status 03/22/2020 FINAL  Final  Urine Culture     Status: Abnormal   Collection Time: 03/17/20 11:29 AM   Specimen: Urine, Random  Result Value Ref Range Status   Specimen Description   Final    URINE, RANDOM Performed at [facility], [address] [place], [place] [address]     Special Requests   Final    NONE Performed at [facility], [address], [place], [place] [address]  Culture (A)  Final    >=100,000 COLONIES/mL METHICILLIN RESISTANT STAPHYLOCOCCUS AUREUS   Report Status 03/19/2020 FINAL  Final   Organism ID, Bacteria METHICILLIN RESISTANT STAPHYLOCOCCUS AUREUS (A)  Final      Susceptibility   Methicillin resistant staphylococcus aureus - MIC*    CIPROFLOXACIN >=8 RESISTANT Resistant     GENTAMICIN <=0.5 SENSITIVE Sensitive     NITROFURANTOIN <=16 SENSITIVE Sensitive     OXACILLIN >=4 RESISTANT Resistant     TETRACYCLINE <=1 SENSITIVE Sensitive     VANCOMYCIN <=0.5 SENSITIVE Sensitive     TRIMETH/SULFA <=10 SENSITIVE Sensitive     CLINDAMYCIN >=8 RESISTANT Resistant     RIFAMPIN <=0.5 SENSITIVE Sensitive     Inducible Clindamycin NEGATIVE Sensitive     * >=100,000 COLONIES/mL METHICILLIN RESISTANT STAPHYLOCOCCUS AUREUS  MRSA PCR Screening     Status: Abnormal   Collection Time: 03/18/20  6:50 PM   Specimen: Nasopharyngeal  Result Value Ref Range Status   MRSA by PCR POSITIVE (A) NEGATIVE Final    Comment:        The GeneXpert MRSA Assay (FDA approved for NASAL specimens only), is one component of a comprehensive MRSA colonization surveillance program. It is not intended to diagnose MRSA infection nor to guide or monitor treatment for MRSA infections. RESULT CALLED TO, READ BACK BY AND VERIFIED WITH: DOREEN APALOO 03/18/20 _0  BY ACR Performed at [facility], [address] [place], [place] [address]     RADIOLOGY:  No results found.   CODE STATUS:     Code Status Orders  (From admission, onward)         Start     Ordered   03/17/20 1648  Do not attempt resuscitation (DNR)  Continuous       Question Answer Comment  In the event of cardiac or respiratory ARREST Do not call a "code blue"   In the event of cardiac or respiratory ARREST Do not perform Intubation, CPR, defibrillation or ACLS   In the  event of cardiac or respiratory ARREST Use medication by any route, position, wound care, and other measures to relive pain and suffering. May use oxygen, suction and manual treatment of airway obstruction as needed for comfort.   Comments Discussed code status with patient's daughter at the bedside June Sweeny.  Patient is a DO NOT RESUSCITATE      03/17/20 1648        Code Status History    Date Active Date Inactive Code Status Order ID Comments User Context   03/17/2020 1508 03/17/2020 1648 Full Code [id number]  Collier Bullock, MD ED   12/09/2017 2244 12/10/2017 1416 Full Code [id number]  Lance Coon, MD Inpatient   04/25/2017 0348 04/26/2017 1430 Full Code [id number]  Harvie Bridge, DO ED   09/08/2015 1733 09/09/2015 1747 Full Code [id number]  Fritzi Mandes, MD Inpatient   Advance Care Planning Activity    Advance Directive Documentation     Most Recent Value  Type of Advance Directive Out of facility DNR (pink MOST or yellow form)  Pre-existing out of facility DNR order (yellow form or pink MOST form) Yellow form placed in chart (order not valid for inpatient use)  "MOST" Form in Place? --       TOTAL TIME TAKING CARE OF THIS PATIENT: *35* minutes.    Fritzi Mandes M.D  [facility]    CC: Primary care physician; Idelle Crouch, MD

## 2020-03-24 NOTE — Care Management Important Message (Signed)
Important Message  Patient Details  Name: MEGGAN DHALIWAL MRN: [id number] Date of Birth: 03-07-1923   Medicare Important Message Given:  Yes  Given to daughter in room   Neale Burly 03/24/2020, 11:25 AM

## 2020-03-24 NOTE — Progress Notes (Signed)
Report called to [facility] to [facility]. EMS transport.

## 2020-05-05 MED FILL — Metoprolol Tartrate IV Soln 5 MG/5ML: INTRAVENOUS | Qty: 5 | Status: AC

## 2020-05-05 MED FILL — Ketorolac Tromethamine Inj 30 MG/ML: INTRAMUSCULAR | Qty: 0.5 | Status: AC

## 2020-06-06 DEATH — deceased
